# Patient Record
Sex: Female | Born: 1945 | Race: White | Hispanic: No | Marital: Married | Smoking: Never smoker
Health system: Southern US, Community
[De-identification: ages and names within clinical notes are randomized; demographics above are authoritative.]

## PROBLEM LIST (undated history)

## (undated) DIAGNOSIS — N952 Postmenopausal atrophic vaginitis: Secondary | ICD-10-CM

## (undated) DIAGNOSIS — N390 Urinary tract infection, site not specified: Secondary | ICD-10-CM

## (undated) DIAGNOSIS — T7840XA Allergy, unspecified, initial encounter: Secondary | ICD-10-CM

## (undated) DIAGNOSIS — M797 Fibromyalgia: Secondary | ICD-10-CM

## (undated) DIAGNOSIS — R011 Cardiac murmur, unspecified: Secondary | ICD-10-CM

## (undated) DIAGNOSIS — G43909 Migraine, unspecified, not intractable, without status migrainosus: Secondary | ICD-10-CM

## (undated) DIAGNOSIS — I341 Nonrheumatic mitral (valve) prolapse: Secondary | ICD-10-CM

## (undated) DIAGNOSIS — M8580 Other specified disorders of bone density and structure, unspecified site: Secondary | ICD-10-CM

## (undated) DIAGNOSIS — R911 Solitary pulmonary nodule: Secondary | ICD-10-CM

## (undated) DIAGNOSIS — K589 Irritable bowel syndrome without diarrhea: Secondary | ICD-10-CM

## (undated) DIAGNOSIS — I10 Essential (primary) hypertension: Secondary | ICD-10-CM

## (undated) DIAGNOSIS — K5730 Diverticulosis of large intestine without perforation or abscess without bleeding: Secondary | ICD-10-CM

## (undated) DIAGNOSIS — K219 Gastro-esophageal reflux disease without esophagitis: Secondary | ICD-10-CM

## (undated) DIAGNOSIS — K649 Unspecified hemorrhoids: Secondary | ICD-10-CM

## (undated) DIAGNOSIS — E785 Hyperlipidemia, unspecified: Secondary | ICD-10-CM

## (undated) HISTORY — DX: Postmenopausal atrophic vaginitis: N95.2

## (undated) HISTORY — DX: Nonrheumatic mitral (valve) prolapse: I34.1

## (undated) HISTORY — DX: Urinary tract infection, site not specified: N39.0

## (undated) HISTORY — DX: Fibromyalgia: M79.7

## (undated) HISTORY — DX: Solitary pulmonary nodule: R91.1

## (undated) HISTORY — DX: Other specified disorders of bone density and structure, unspecified site: M85.80

## (undated) HISTORY — DX: Hyperlipidemia, unspecified: E78.5

## (undated) HISTORY — DX: Diverticulosis of large intestine without perforation or abscess without bleeding: K57.30

## (undated) HISTORY — DX: Cardiac murmur, unspecified: R01.1

## (undated) HISTORY — DX: Irritable bowel syndrome, unspecified: K58.9

## (undated) HISTORY — DX: Gastro-esophageal reflux disease without esophagitis: K21.9

## (undated) HISTORY — DX: Unspecified hemorrhoids: K64.9

## (undated) HISTORY — DX: Allergy, unspecified, initial encounter: T78.40XA

## (undated) HISTORY — DX: Migraine, unspecified, not intractable, without status migrainosus: G43.909

---

## 1993-09-24 HISTORY — PX: BURCH PROCEDURE: SHX1273

## 1993-09-24 HISTORY — PX: BLADDER SURGERY: SHX569

## 1999-07-12 ENCOUNTER — Other Ambulatory Visit: Admission: RE | Admit: 1999-07-12 | Discharge: 1999-07-12 | Payer: Self-pay | Admitting: Obstetrics and Gynecology

## 2000-07-31 ENCOUNTER — Other Ambulatory Visit: Admission: RE | Admit: 2000-07-31 | Discharge: 2000-07-31 | Payer: Self-pay | Admitting: Obstetrics and Gynecology

## 2001-06-12 ENCOUNTER — Emergency Department (HOSPITAL_COMMUNITY): Admission: EM | Admit: 2001-06-12 | Discharge: 2001-06-12 | Payer: Self-pay | Admitting: Emergency Medicine

## 2001-09-24 HISTORY — PX: BREAST BIOPSY: SHX20

## 2001-11-03 ENCOUNTER — Other Ambulatory Visit: Admission: RE | Admit: 2001-11-03 | Discharge: 2001-11-03 | Payer: Self-pay | Admitting: Obstetrics and Gynecology

## 2003-01-13 ENCOUNTER — Other Ambulatory Visit: Admission: RE | Admit: 2003-01-13 | Discharge: 2003-01-13 | Payer: Self-pay | Admitting: Obstetrics and Gynecology

## 2003-09-25 DIAGNOSIS — N390 Urinary tract infection, site not specified: Secondary | ICD-10-CM

## 2003-09-25 HISTORY — DX: Urinary tract infection, site not specified: N39.0

## 2004-03-06 ENCOUNTER — Encounter: Payer: Self-pay | Admitting: Internal Medicine

## 2004-05-25 ENCOUNTER — Other Ambulatory Visit: Admission: RE | Admit: 2004-05-25 | Discharge: 2004-05-25 | Payer: Self-pay | Admitting: Obstetrics and Gynecology

## 2005-03-22 ENCOUNTER — Ambulatory Visit (HOSPITAL_COMMUNITY): Admission: RE | Admit: 2005-03-22 | Discharge: 2005-03-22 | Payer: Self-pay | Admitting: Obstetrics and Gynecology

## 2005-05-16 ENCOUNTER — Ambulatory Visit: Payer: Self-pay | Admitting: Internal Medicine

## 2005-05-24 ENCOUNTER — Encounter: Admission: RE | Admit: 2005-05-24 | Discharge: 2005-08-22 | Payer: Self-pay | Admitting: Internal Medicine

## 2005-07-05 ENCOUNTER — Ambulatory Visit: Payer: Self-pay | Admitting: Family Medicine

## 2005-08-01 ENCOUNTER — Other Ambulatory Visit: Admission: RE | Admit: 2005-08-01 | Discharge: 2005-08-01 | Payer: Self-pay | Admitting: Obstetrics and Gynecology

## 2006-01-10 ENCOUNTER — Ambulatory Visit: Payer: Self-pay | Admitting: Internal Medicine

## 2006-01-29 ENCOUNTER — Ambulatory Visit: Payer: Self-pay | Admitting: Internal Medicine

## 2006-07-25 DIAGNOSIS — R911 Solitary pulmonary nodule: Secondary | ICD-10-CM

## 2006-07-25 HISTORY — DX: Solitary pulmonary nodule: R91.1

## 2006-08-02 ENCOUNTER — Inpatient Hospital Stay (HOSPITAL_COMMUNITY): Admission: EM | Admit: 2006-08-02 | Discharge: 2006-08-03 | Payer: Self-pay | Admitting: Emergency Medicine

## 2006-08-02 ENCOUNTER — Ambulatory Visit: Payer: Self-pay | Admitting: Internal Medicine

## 2006-08-07 ENCOUNTER — Other Ambulatory Visit: Admission: RE | Admit: 2006-08-07 | Discharge: 2006-08-07 | Payer: Self-pay | Admitting: Obstetrics and Gynecology

## 2006-08-08 ENCOUNTER — Ambulatory Visit: Payer: Self-pay | Admitting: Internal Medicine

## 2006-10-31 ENCOUNTER — Ambulatory Visit: Payer: Self-pay | Admitting: Internal Medicine

## 2006-11-26 ENCOUNTER — Ambulatory Visit: Payer: Self-pay | Admitting: Internal Medicine

## 2006-11-26 LAB — CONVERTED CEMR LAB
ALT: 19 units/L (ref 0–40)
AST: 22 units/L (ref 0–37)
Albumin: 4.2 g/dL (ref 3.5–5.2)
Alkaline Phosphatase: 38 units/L — ABNORMAL LOW (ref 39–117)
BUN: 11 mg/dL (ref 6–23)
Basophils Absolute: 0 10*3/uL (ref 0.0–0.1)
Basophils Relative: 0.6 % (ref 0.0–1.0)
Bilirubin, Direct: 0.1 mg/dL (ref 0.0–0.3)
CO2: 28 meq/L (ref 19–32)
Calcium: 9.5 mg/dL (ref 8.4–10.5)
Chloride: 103 meq/L (ref 96–112)
Cholesterol: 255 mg/dL (ref 0–200)
Creatinine, Ser: 0.8 mg/dL (ref 0.4–1.2)
Direct LDL: 151.9 mg/dL
Eosinophils Absolute: 0 10*3/uL (ref 0.0–0.6)
Eosinophils Relative: 1 % (ref 0.0–5.0)
GFR calc Af Amer: 94 mL/min
GFR calc non Af Amer: 78 mL/min
Glucose, Bld: 86 mg/dL (ref 70–99)
HCT: 41.2 % (ref 36.0–46.0)
HDL: 81.7 mg/dL (ref >39.0)
Hemoglobin: 14 g/dL (ref 12.0–15.0)
Hgb A1c MFr Bld: 5.4 % (ref 4.6–6.0)
Lymphocytes Relative: 22.6 % (ref 12.0–46.0)
MCHC: 33.9 g/dL (ref 30.0–36.0)
MCV: 90.4 fL (ref 78.0–100.0)
Monocytes Absolute: 0.3 10*3/uL (ref 0.2–0.7)
Monocytes Relative: 6.2 % (ref 3.0–11.0)
Neutro Abs: 3.4 10*3/uL (ref 1.4–7.7)
Neutrophils Relative %: 69.6 % (ref 43.0–77.0)
Platelets: 261 10*3/uL (ref 150–400)
Potassium: 4.1 meq/L (ref 3.5–5.1)
RBC: 4.56 M/uL (ref 3.87–5.11)
RDW: 12.1 % (ref 11.5–14.6)
Sodium: 138 meq/L (ref 135–145)
TSH: 2.27 microintl units/mL (ref 0.35–5.50)
Total Bilirubin: 1 mg/dL (ref 0.3–1.2)
Total CHOL/HDL Ratio: 3.1
Total Protein: 6.7 g/dL (ref 6.0–8.3)
Triglycerides: 55 mg/dL (ref 0–149)
VLDL: 11 mg/dL (ref 0–40)
WBC: 4.8 10*3/uL (ref 4.5–10.5)

## 2007-06-13 ENCOUNTER — Encounter: Payer: Self-pay | Admitting: Internal Medicine

## 2007-07-09 ENCOUNTER — Ambulatory Visit: Payer: Self-pay | Admitting: Internal Medicine

## 2007-08-11 ENCOUNTER — Other Ambulatory Visit: Admission: RE | Admit: 2007-08-11 | Discharge: 2007-08-11 | Payer: Self-pay | Admitting: Obstetrics and Gynecology

## 2008-05-27 ENCOUNTER — Ambulatory Visit: Payer: Self-pay | Admitting: Internal Medicine

## 2008-05-27 DIAGNOSIS — K589 Irritable bowel syndrome without diarrhea: Secondary | ICD-10-CM

## 2008-05-27 DIAGNOSIS — M8580 Other specified disorders of bone density and structure, unspecified site: Secondary | ICD-10-CM

## 2008-05-27 DIAGNOSIS — IMO0001 Reserved for inherently not codable concepts without codable children: Secondary | ICD-10-CM

## 2008-05-27 DIAGNOSIS — R5382 Chronic fatigue, unspecified: Secondary | ICD-10-CM

## 2008-05-31 DIAGNOSIS — E785 Hyperlipidemia, unspecified: Secondary | ICD-10-CM | POA: Insufficient documentation

## 2008-06-01 ENCOUNTER — Encounter (INDEPENDENT_AMBULATORY_CARE_PROVIDER_SITE_OTHER): Payer: Self-pay | Admitting: *Deleted

## 2008-06-01 ENCOUNTER — Telehealth (INDEPENDENT_AMBULATORY_CARE_PROVIDER_SITE_OTHER): Payer: Self-pay | Admitting: *Deleted

## 2008-06-03 ENCOUNTER — Encounter: Payer: Self-pay | Admitting: Internal Medicine

## 2008-06-04 ENCOUNTER — Encounter (INDEPENDENT_AMBULATORY_CARE_PROVIDER_SITE_OTHER): Payer: Self-pay | Admitting: *Deleted

## 2008-06-23 ENCOUNTER — Telehealth (INDEPENDENT_AMBULATORY_CARE_PROVIDER_SITE_OTHER): Payer: Self-pay | Admitting: *Deleted

## 2008-08-10 ENCOUNTER — Telehealth (INDEPENDENT_AMBULATORY_CARE_PROVIDER_SITE_OTHER): Payer: Self-pay | Admitting: *Deleted

## 2008-08-18 ENCOUNTER — Other Ambulatory Visit: Admission: RE | Admit: 2008-08-18 | Discharge: 2008-08-18 | Payer: Self-pay | Admitting: Obstetrics and Gynecology

## 2008-08-18 ENCOUNTER — Encounter: Payer: Self-pay | Admitting: Obstetrics and Gynecology

## 2008-08-18 ENCOUNTER — Ambulatory Visit: Payer: Self-pay | Admitting: Obstetrics and Gynecology

## 2008-09-29 ENCOUNTER — Telehealth (INDEPENDENT_AMBULATORY_CARE_PROVIDER_SITE_OTHER): Payer: Self-pay | Admitting: *Deleted

## 2008-10-06 ENCOUNTER — Other Ambulatory Visit: Payer: Self-pay | Admitting: Emergency Medicine

## 2008-10-06 ENCOUNTER — Observation Stay (HOSPITAL_COMMUNITY): Admission: EM | Admit: 2008-10-06 | Discharge: 2008-10-07 | Payer: Self-pay | Admitting: Emergency Medicine

## 2008-10-06 ENCOUNTER — Ambulatory Visit: Payer: Self-pay | Admitting: Internal Medicine

## 2008-10-06 ENCOUNTER — Other Ambulatory Visit: Payer: Self-pay | Admitting: Internal Medicine

## 2008-10-06 ENCOUNTER — Ambulatory Visit: Payer: Self-pay | Admitting: Cardiology

## 2008-10-07 ENCOUNTER — Encounter: Payer: Self-pay | Admitting: Internal Medicine

## 2008-10-14 ENCOUNTER — Ambulatory Visit: Payer: Self-pay | Admitting: Internal Medicine

## 2008-10-26 ENCOUNTER — Encounter: Payer: Self-pay | Admitting: Internal Medicine

## 2008-11-19 ENCOUNTER — Encounter: Admission: RE | Admit: 2008-11-19 | Discharge: 2009-02-17 | Payer: Self-pay | Admitting: Otolaryngology

## 2008-12-07 ENCOUNTER — Encounter: Payer: Self-pay | Admitting: Internal Medicine

## 2008-12-20 ENCOUNTER — Encounter: Admission: RE | Admit: 2008-12-20 | Discharge: 2009-01-27 | Payer: Self-pay | Admitting: Rheumatology

## 2009-01-11 ENCOUNTER — Encounter: Payer: Self-pay | Admitting: Internal Medicine

## 2009-02-25 ENCOUNTER — Encounter: Payer: Self-pay | Admitting: Internal Medicine

## 2009-06-03 ENCOUNTER — Encounter: Payer: Self-pay | Admitting: Internal Medicine

## 2009-06-06 ENCOUNTER — Telehealth (INDEPENDENT_AMBULATORY_CARE_PROVIDER_SITE_OTHER): Payer: Self-pay | Admitting: *Deleted

## 2009-06-09 ENCOUNTER — Ambulatory Visit: Payer: Self-pay | Admitting: Internal Medicine

## 2009-06-09 DIAGNOSIS — H6120 Impacted cerumen, unspecified ear: Secondary | ICD-10-CM

## 2009-06-09 DIAGNOSIS — K5730 Diverticulosis of large intestine without perforation or abscess without bleeding: Secondary | ICD-10-CM | POA: Insufficient documentation

## 2009-06-10 ENCOUNTER — Encounter (INDEPENDENT_AMBULATORY_CARE_PROVIDER_SITE_OTHER): Payer: Self-pay | Admitting: *Deleted

## 2009-06-13 ENCOUNTER — Encounter (INDEPENDENT_AMBULATORY_CARE_PROVIDER_SITE_OTHER): Payer: Self-pay | Admitting: *Deleted

## 2009-06-15 ENCOUNTER — Telehealth (INDEPENDENT_AMBULATORY_CARE_PROVIDER_SITE_OTHER): Payer: Self-pay | Admitting: *Deleted

## 2009-06-20 ENCOUNTER — Encounter: Payer: Self-pay | Admitting: Internal Medicine

## 2009-07-04 ENCOUNTER — Ambulatory Visit: Payer: Self-pay | Admitting: Internal Medicine

## 2009-07-14 ENCOUNTER — Ambulatory Visit: Payer: Self-pay | Admitting: Internal Medicine

## 2009-09-01 ENCOUNTER — Encounter: Payer: Self-pay | Admitting: Internal Medicine

## 2009-12-17 IMAGING — CR DG CHEST 1V PORT
1 series · 1 of 1 positions shown · non-contrast
Comparison: 08/02/2006

CLINICAL DATA: Chest pain

PORTABLE CHEST - 1 VIEW

[AP]
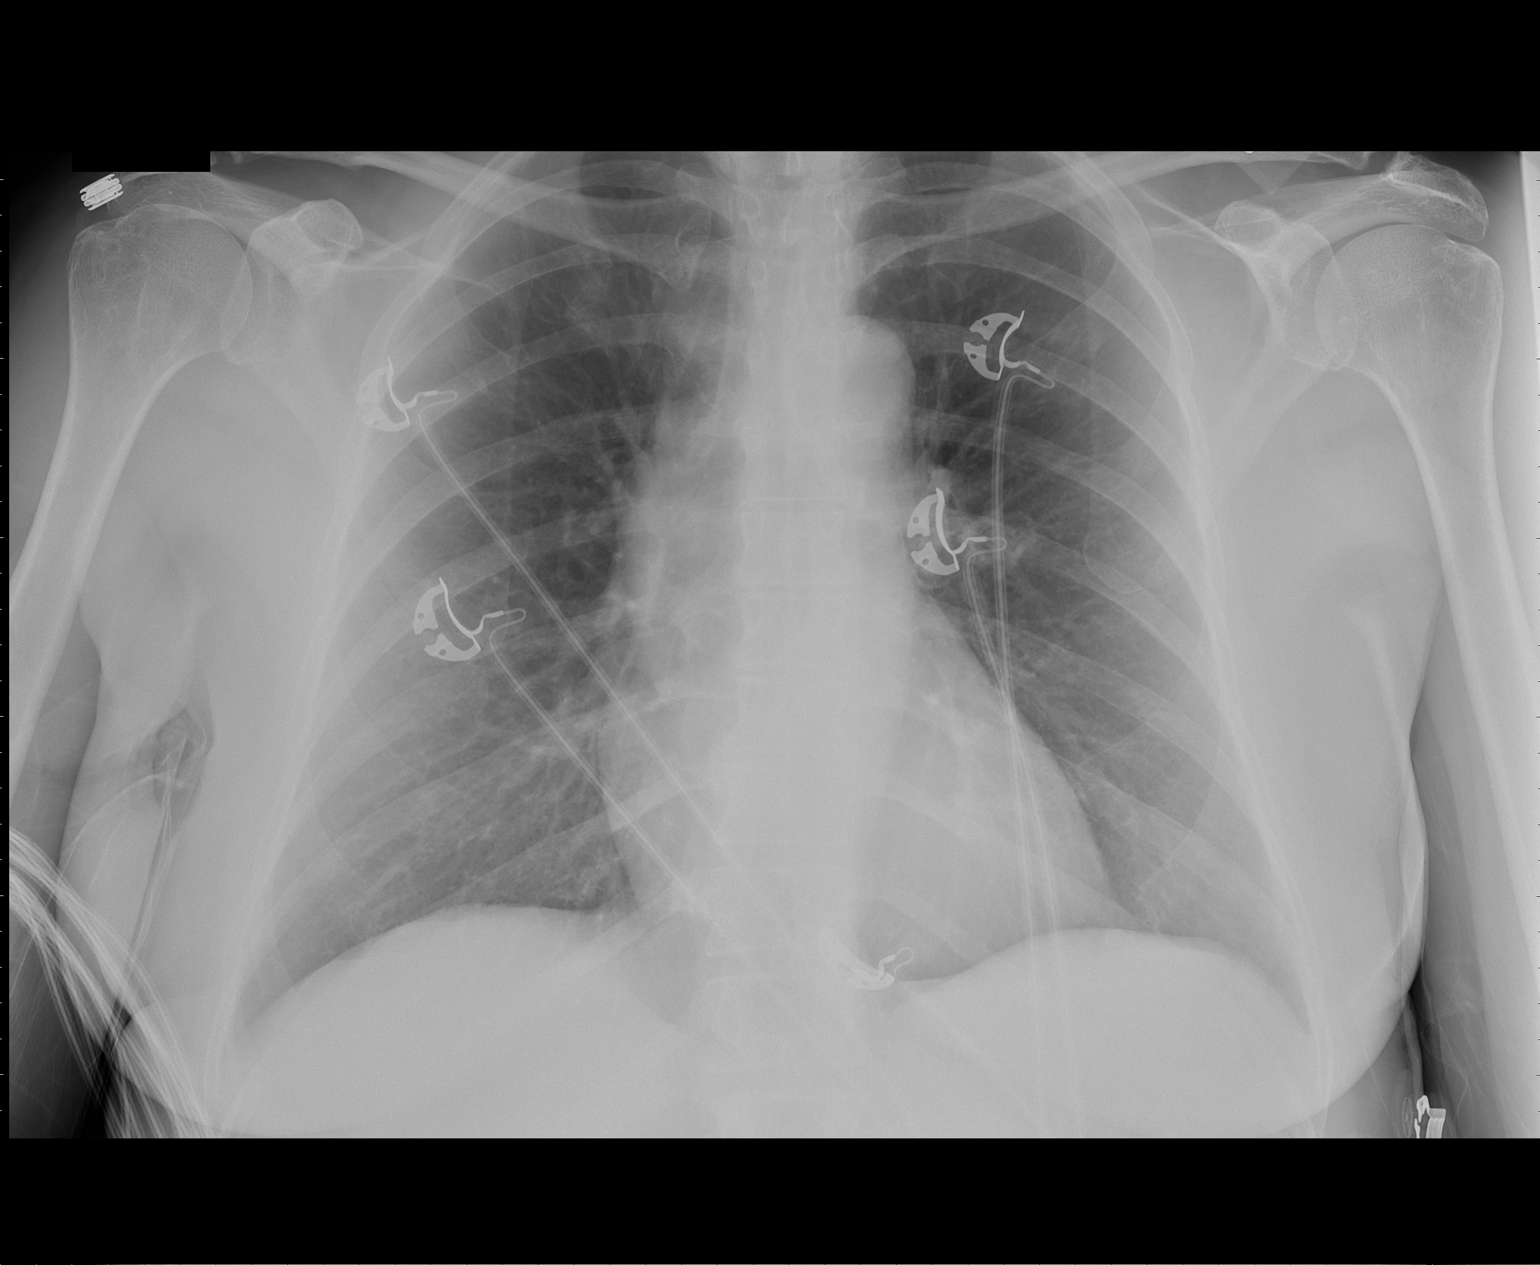

[1 of 1 positions shown; findings below may reference images not displayed]

FINDINGS: Heart and mediastinal contours normal.  Possible right
upper lobe nodule but unchanged.  If this is a lung lesion that is
stable for a period of greater than 2 years, and needs no further
evaluation.  It may actually the a calcified costochondral
cartilage.  At any rate, it is of no clinical significance.
IMPRESSION: No active disease - stable right upper lobe nodule versus calcified
costochondral cartilage.

## 2009-12-22 ENCOUNTER — Other Ambulatory Visit: Admission: RE | Admit: 2009-12-22 | Discharge: 2009-12-22 | Payer: Self-pay | Admitting: Obstetrics and Gynecology

## 2009-12-22 ENCOUNTER — Ambulatory Visit: Payer: Self-pay | Admitting: Obstetrics and Gynecology

## 2009-12-27 ENCOUNTER — Ambulatory Visit: Payer: Self-pay | Admitting: Internal Medicine

## 2010-01-06 ENCOUNTER — Encounter: Payer: Self-pay | Admitting: Internal Medicine

## 2010-01-17 ENCOUNTER — Telehealth (INDEPENDENT_AMBULATORY_CARE_PROVIDER_SITE_OTHER): Payer: Self-pay | Admitting: *Deleted

## 2010-01-19 ENCOUNTER — Ambulatory Visit: Payer: Self-pay | Admitting: Internal Medicine

## 2010-01-23 LAB — CONVERTED CEMR LAB: Vit D, 25-Hydroxy: 42 ng/mL (ref 30–89)

## 2010-05-04 ENCOUNTER — Encounter: Payer: Self-pay | Admitting: Internal Medicine

## 2010-06-21 ENCOUNTER — Ambulatory Visit: Payer: Self-pay | Admitting: Internal Medicine

## 2010-10-20 ENCOUNTER — Encounter: Payer: Self-pay | Admitting: Internal Medicine

## 2010-10-22 LAB — CONVERTED CEMR LAB
ALT: 23 units/L (ref 0–35)
AST: 23 units/L (ref 0–37)
Albumin: 4.3 g/dL (ref 3.5–5.2)
Alkaline Phosphatase: 31 units/L — ABNORMAL LOW (ref 39–117)
Alkaline Phosphatase: 37 units/L — ABNORMAL LOW (ref 39–117)
BUN: 13 mg/dL (ref 6–23)
Basophils Absolute: 0 10*3/uL (ref 0.0–0.1)
Basophils Absolute: 0 10*3/uL (ref 0.0–0.1)
Bilirubin Urine: NEGATIVE
Bilirubin Urine: NEGATIVE
Bilirubin, Direct: 0.1 mg/dL (ref 0.0–0.3)
Bilirubin, Direct: 0.1 mg/dL (ref 0.0–0.3)
Blood in Urine, dipstick: NEGATIVE
CO2: 27 meq/L (ref 19–32)
CO2: 29 meq/L (ref 19–32)
Calcium: 9.2 mg/dL (ref 8.4–10.5)
Cholesterol: 236 mg/dL — ABNORMAL HIGH (ref 0–200)
Creatinine, Ser: 0.8 mg/dL (ref 0.4–1.2)
Eosinophils Absolute: 0 10*3/uL (ref 0.0–0.7)
GFR calc Af Amer: 94 mL/min
GFR calc non Af Amer: 77.08 mL/min (ref >60)
Glucose, Bld: 88 mg/dL (ref 70–99)
Glucose, Bld: 89 mg/dL (ref 70–99)
HCT: 38.9 % (ref 36.0–46.0)
HDL: 76.5 mg/dL (ref >39.00)
Hemoglobin: 13.9 g/dL (ref 12.0–15.0)
Hemoglobin: 14 g/dL (ref 12.0–15.0)
Ketones, urine, test strip: NEGATIVE
Ketones, urine, test strip: NEGATIVE
Lymphocytes Relative: 23.5 % (ref 12.0–46.0)
MCHC: 34.1 g/dL (ref 30.0–36.0)
MCHC: 35.8 g/dL (ref 30.0–36.0)
MCV: 90.6 fL (ref 78.0–100.0)
Monocytes Relative: 6.2 % (ref 3.0–12.0)
Monocytes Relative: 7.3 % (ref 3.0–12.0)
Neutrophils Relative %: 69.2 % (ref 43.0–77.0)
Nitrite: NEGATIVE
Nitrite: NEGATIVE
Platelets: 231 10*3/uL (ref 150.0–400.0)
Potassium: 3.9 meq/L (ref 3.5–5.1)
Protein, U semiquant: NEGATIVE
RBC: 4.29 M/uL (ref 3.87–5.11)
RDW: 11.7 % (ref 11.5–14.6)
Sodium: 134 meq/L — ABNORMAL LOW (ref 135–145)
Sodium: 139 meq/L (ref 135–145)
Specific Gravity, Urine: <1.005
Total Bilirubin: 0.9 mg/dL (ref 0.3–1.2)
Total Bilirubin: 1 mg/dL (ref 0.3–1.2)
Total Protein: 6.9 g/dL (ref 6.0–8.3)
Triglycerides: 37 mg/dL (ref 0.0–149.0)
Urobilinogen, UA: NEGATIVE
Vit D, 1,25-Dihydroxy: 31 (ref 30–89)
Vit D, 25-Hydroxy: 37 ng/mL (ref 30–89)
WBC: 4.6 10*3/uL (ref 4.5–10.5)

## 2010-10-24 NOTE — Assessment & Plan Note (Signed)
Summary: FLU SHOT/CBS  Nurse Visit   Allergies: 1)  ! Levaquin 2)  ! Clindamycin 3)  ! Lipitor 4)  ! Actonel (Risedronate Sodium) 5)  ! Prednisone  Orders Added: 1)  Admin 1st Vaccine [90471] 2)  Flu Vaccine 18yrs + [16109] Flu Vaccine Consent Questions     Do you have a history of severe allergic reactions to this vaccine? no    Any prior history of allergic reactions to egg and/or gelatin? no    Do you have a sensitivity to the preservative Thimersol? no    Do you have a past history of Guillan-Barre Syndrome? no    Do you currently have an acute febrile illness? no    Have you ever had a severe reaction to latex? no    Vaccine information given and explained to patient? yes    Are you currently pregnant? no    Lot Number:[id number]   Exp Date:03/24/2011   Site Given  Left Deltoid IM

## 2010-10-24 NOTE — Letter (Signed)
Summary: [facility]  [facility]   Imported By: Lanelle Bal 01/18/2010 14:23:32  _____________________________________________________________________  External Attachment:    Type:   Image     Comment:   External Document

## 2010-10-24 NOTE — Progress Notes (Signed)
Summary: Lab Concerns  Phone Note Call from Patient Call back at Home Phone [phone]   Caller: Patient Summary of Call: Message left on VM: patient had labs done a few weeks back and would like results. Please call   I looked in the lab and patient was here on 12/27/09 and had Vit D but no report. Rene Kocher in the labs will do a futher follow-up  I contacted the patient and left message informing her I will call her when I get an update from [facility]  January 17, 2010 2:07 PM      Follow-up for Phone Call        Please document what happened to the patient's blood.  Follow-up by: Shonna Chock,  January 18, 2010 8:22 AM  Additional Follow-up for Phone Call Additional follow up Details #1::        I spoke with pt and she has a lab appt tomorrow a.m. for Vitamin D Level- Pt is aware she will only be charged once for the lab. Army Fossa CMA  January 18, 2010 1:29 PM

## 2010-10-24 NOTE — Letter (Signed)
Summary: [facility]  [facility]   Imported By: Lanelle Bal 05/12/2010 14:19:39  _____________________________________________________________________  External Attachment:    Type:   Image     Comment:   External Document

## 2010-11-09 NOTE — Letter (Signed)
Summary: [facility]  [facility]   Imported By: Maryln Gottron 11/03/2010 10:19:10  _____________________________________________________________________  External Attachment:    Type:   Image     Comment:   External Document

## 2011-01-08 LAB — CARDIAC PANEL(CRET KIN+CKTOT+MB+TROPI)
Total CK: 101 U/L (ref 7–177)
Troponin I: 0.01 ng/mL (ref 0.00–0.06)
Troponin I: <0.01 ng/mL (ref 0.00–0.06)

## 2011-01-08 LAB — CBC
HCT: 37.2 % (ref 36.0–46.0)
Hemoglobin: 12.8 g/dL (ref 12.0–15.0)
Platelets: 218 10*3/uL (ref 150–400)
RDW: 13 % (ref 11.5–15.5)
WBC: 5.4 10*3/uL (ref 4.0–10.5)

## 2011-01-08 LAB — POCT CARDIAC MARKERS: Myoglobin, poc: 53.7 ng/mL (ref 12–200)

## 2011-01-08 LAB — LIPID PANEL
HDL: 79 mg/dL (ref >39)
Total CHOL/HDL Ratio: 3.1 RATIO

## 2011-01-08 LAB — DIFFERENTIAL
Basophils Absolute: 0 10*3/uL (ref 0.0–0.1)
Basophils Relative: 1 % (ref 0–1)
Eosinophils Absolute: 0.1 10*3/uL (ref 0.0–0.7)
Lymphocytes Relative: 25 % (ref 12–46)
Lymphs Abs: 1.4 10*3/uL (ref 0.7–4.0)
Monocytes Relative: 7 % (ref 3–12)
Neutro Abs: 3.6 10*3/uL (ref 1.7–7.7)
Neutrophils Relative %: 67 % (ref 43–77)

## 2011-01-08 LAB — BASIC METABOLIC PANEL
CO2: 25 mEq/L (ref 19–32)
Calcium: 9 mg/dL (ref 8.4–10.5)
Creatinine, Ser: 0.78 mg/dL (ref 0.4–1.2)
GFR calc Af Amer: >60 mL/min (ref >60)
GFR calc non Af Amer: >60 mL/min (ref >60)
Glucose, Bld: 90 mg/dL (ref 70–99)

## 2011-01-08 LAB — CK TOTAL AND CKMB (NOT AT ARMC)
CK, MB: 3.9 ng/mL (ref 0.3–4.0)
Total CK: 183 U/L — ABNORMAL HIGH (ref 7–177)

## 2011-01-08 LAB — TROPONIN I: Troponin I: 0.01 ng/mL (ref 0.00–0.06)

## 2011-01-08 LAB — D-DIMER, QUANTITATIVE: D-Dimer, Quant: 0.41 ug/mL-FEU (ref 0.00–0.48)

## 2011-01-08 LAB — BRAIN NATRIURETIC PEPTIDE: Pro B Natriuretic peptide (BNP): <30 pg/mL (ref 0.0–100.0)

## 2011-01-11 ENCOUNTER — Encounter (INDEPENDENT_AMBULATORY_CARE_PROVIDER_SITE_OTHER): Payer: PRIVATE HEALTH INSURANCE | Admitting: Obstetrics and Gynecology

## 2011-01-11 ENCOUNTER — Other Ambulatory Visit: Payer: Self-pay | Admitting: Obstetrics and Gynecology

## 2011-01-11 ENCOUNTER — Other Ambulatory Visit (HOSPITAL_COMMUNITY)
Admission: RE | Admit: 2011-01-11 | Discharge: 2011-01-11 | Disposition: A | Discharge status: Home / Self Care | Payer: PRIVATE HEALTH INSURANCE | Source: Ambulatory Visit | Attending: Obstetrics and Gynecology | Admitting: Obstetrics and Gynecology

## 2011-01-11 DIAGNOSIS — R82998 Other abnormal findings in urine: Secondary | ICD-10-CM

## 2011-01-11 DIAGNOSIS — Z01419 Encounter for gynecological examination (general) (routine) without abnormal findings: Secondary | ICD-10-CM

## 2011-01-11 DIAGNOSIS — Z124 Encounter for screening for malignant neoplasm of cervix: Secondary | ICD-10-CM | POA: Insufficient documentation

## 2011-01-16 DIAGNOSIS — Z1211 Encounter for screening for malignant neoplasm of colon: Secondary | ICD-10-CM

## 2011-01-26 ENCOUNTER — Other Ambulatory Visit: Payer: PRIVATE HEALTH INSURANCE

## 2011-02-06 NOTE — H&P (Signed)
Heather Ramsey, Heather Ramsey           ACCOUNT NO.:  [id number]   MEDICAL RECORD NO.:  [id number]          PATIENT TYPE:  EMS   LOCATION:  MINO                         FACILITY:  [facility]   PHYSICIAN:  Valerie A. Felicity Coyer, MDDATE OF BIRTH:  [birth year]   DATE OF ADMISSION:  10/06/2008  DATE OF DISCHARGE:                              HISTORY & PHYSICAL   PRIMARY CARE PHYSICIAN:  Titus Dubin. Hopper, MD,FACP,FCCP   CHIEF COMPLAINT:  Chest pain and jaw pain; dizziness with multiple falls  over last 2 months.   HISTORY OF PRESENT ILLNESS:  The patient is a 65 year old white female  recently healthy, but with fibromyalgia.  No known hypertension or  diabetes, who presents to the [facility] Emergency Room this morning  with multiple complaints.  First and most recent is the onset of chest  pain and jaw pain symptoms this morning.  She describes extreme pain  beneath her left breast 10/10 with simultaneous right-sided jaw pain.  Symptoms were sudden onset.  One previous episode like today occurring  in July of last year, but did not persist at that time like it did  today.  This episode lasted 9 minutes.  A persisting dull sensation  remained with her after these intense 9 minutes of pain, but at this  time of my exam, she has 0/10 pain symptoms without any intervention.  She was concerned for heart attack due to family history of same having  3 sisters with heart attacks ranging one in her 56s, 2 in their 33s,  also her father with heart attack, his first being suffered in his 30s.  She has other complaints including buckling of her knees with weakness  10 days ago while shopping with her husband, she also had an episode of  weakness and fall associated with dizziness that occurred the week  before Thanksgiving in November 2009.  The dizziness with this  Thanksgiving episode occurred while she was in the attic where her head  spun and caused her to fall.  She fell to the ceiling suffering pain  in  her hip.  She had it evaluated at that time by urgent care and says  nothing was broken and has a hematoma that has since resolved.  These  dizzy spells have been on and off since October and she has identified  no pattern to the symptom presence such as time of day, intensity,  relation to food.  No headache.  No vision symptoms or weakness  associated with her dizziness symptoms, besides she is unable to  exercise due to the unpredictable nature of these symptom onset.  She  has an outpatient appointment with ENT at the end of this month due to  these dizziness symptoms.   PAST MEDICAL HISTORY:  Significant for:  1. IBS.  2. Fibromyalgia.  3. Question hyperlipidemia per the EMR.  4. Osteoporosis.   MEDICATIONS:  According to patient,  1. Lidoderm patches 3 daily, one to her hip, one to her back, half to      her right knee and half to her right foot as needed for a daily  basis.  2. Arthrotec 75 mg tablet p.o. b.i.d. p.r.n., none taken in the last      several weeks.  3. Fosamax 70 mg once every Monday.   ALLERGIES:  1. CLINDAMYCIN.  2. LEVAQUIN.   FAMILY HISTORY:  Mother died at age 44 due to a stroke, had a history of  diabetes.  Father died at age 48 with an MI complicating colon cancer  and obstruction, his first MI was in his 30s as stated above.  The  patient with three sisters all of who have had heart attacks; one at age  55, the other two in their 34s, these sisters also had diabetes.   SOCIAL HISTORY:  No tobacco.  She lives with her spouse and dog.   REVIEW OF SYSTEMS:  RHEUMATOLOGIC:  Chronic left lower extremity ache in  her hip, ankle, and knee as well as her right elbow and shoulder  followed by a rheumatologist.  GENERAL:  No travel.  No fever.  No  chills.  CARDIOVASCULAR:  See chest pain in HPI above.  No palpitations.  No lower extremity swelling.  HEME:  No history of clots.  GI:  No known  reflex, positive irritable bowel symptoms, but no  nausea, vomiting, or  diarrhea in recent weeks and no history of diabetes presently.  No hot,  cold, intolerance or skin changes.  RESPIRATORY:  No shortness of breath  or cough.  ENT: Normal hearing without change.  No ear pain or sore  throat.  NEUROLOGIC:  No headache.  No focal weakness.  Positive falls,  see HPI above.  SKIN:  Positive for bruising with falls, but no rashes  or ulcers.  EYES:  No vision change.  No eye pain.   PHYSICAL EXAMINATION:  VITAL SIGNS:  Temperature 98.1, blood pressure  152/81, pulse of 64, respirations 16, sating 99% on room air.  GENERAL:  She is a slightly heavy-set, pleasant white female, in no  acute distress.  Spouse is present at bedside.  NECK:  Thick but supple.  No masses.  No thyromegaly.  No JVD.  EYES:  PERRL.  EOMI.  No nystagmus at rest, no icterus.  No injection.  ENT:  Normal hearing without effusion or clotting of the tympanic  membranes.  Oropharynx is clear.  RESPIRATORY:  No increased work of breathing.  LUNGS:  Lung sounds clear to auscultation bilaterally without wheeze,  rhonchi, or crackle.  CARDIOVASCULAR:  Regular rate and rhythm.  No murmurs or rubs.  No lower  extremity edema.  Dorsalis pedis pulses 2+ bilaterally.  ABDOMEN:  Protuberant but soft, nontender.  Good bowel sounds.  No  masses.  No hepatosplenomegaly appreciated.  NEUROLOGIC:  Cranial nerves II-XII are symmetrically intact.  She  follows commands.  She moves all extremities spontaneously with good  strength.  Her speech is fluent and articulate.  PSYCHIATRY:  Awake, alert, and oriented  x4 with normal mood, pleasant,  and bright affect.   LABORATORY DATA:  Normal CBC with white count 5.4, hemoglobin 12.8, and  platelets 218.  Basic metabolic also in normal limits.  Creatinine 0.78  and glucose 90.  Her D-dimer is negative at 0.41, point of care enzymes  negative x1.  Portable chest x-ray shows a stable right lung nodule  without change over 2 years, also  present on her November 2007 chest x-  [facility] at [facility].  EKG reviewed by me shows sinus rhythm at 56 beats per  minute.  No other acute  ST or T changes.   ASSESSMENT AND PLAN:  1. Transient chest pain and jaw pain, now resolved.  Doubt cardiac,      but positive family history for coronary artery disease.  We will      observe overnight on telemetry, cycle her cardiac enzymes, treat      with aspirin and plan for adenosine Cardiolite in the morning.      Question discharge October 07, 2008, if there is a negative eval.      Symptoms may relate to question esophageal reflux or spasm and we      will therefore start proton pump inhibitor, may also be pleuritic      and costochondritic type as she has not had her home NSAIDs for      arthritis in weeks, encouraged to continue NSAID use p.r.n.  2. Vertigo with dizziness and falls.  Exam is nonfocal, but history of      recurrence and persistence greater than 2 months.  We will check a      head CT to rule out abnormality there, and MRI and MRA if her head      CT is abnormal.  However, if symptoms are intermittent and not      currently present, I do not feel there is a need for neuro      evaluation or MRI at this time.  We will treat with Antivert p.r.n.      vertigo symptoms and outpatient follow up with ENT as stated in HPI      with no new further workup inpatient, provided symptoms remain at      bay and head CT is normal.  3. Fibromyalgia at baseline.  Continue her home Lidoderm patches as      prior to admission with Arthrotec p.r.n.  4. Question history of dyslipidemia as listed in EMR.  The patient is      unaware and we will check a.m. fasting lipid profile and proceed      accordingly.  5. Chronic benign left lung nodule, no change on chest x-ray over 2      years time.  No further workup needed at this time.   Please see orders for further details including scheduling for adenosine  Myoview.      Valerie A. Felicity Coyer, MD   Electronically Signed     VAL/MEDQ  D:  10/06/2008  T:  10/07/2008  Job:  [id number]

## 2011-02-09 NOTE — Discharge Summary (Signed)
NAMECYNDEE, Heather Ramsey NO.:  [id number]   MEDICAL RECORD NO.:  [id number]          PATIENT TYPE:  INP   LOCATION:  2011                         FACILITY:  [facility]   PHYSICIAN:  Corwin Levins, MD      DATE OF BIRTH:  09-Dec-1945   DATE OF ADMISSION:  08/02/2006  DATE OF DISCHARGE:  08/03/2006                                 DISCHARGE SUMMARY   DISCHARGE DIAGNOSES:  1. Chest pain, unclear etiology.  2. History of fibromyalgia.  3. Chronic fatigue.  4. Incidental mild hyponatremia.  5. History of dyslipidemia.  6. Questionable right upper lobe pulmonary nodule by chest x-ray.  7. History of osteoporosis.   PROCEDURES:  None.   CONSULTATIONS:  None.   HISTORY AND PHYSICAL:  See the dictated date of admission, August 02, 2006.   HOSPITAL COURSE:  Ms. Bulls is a 65 year old white female who presented  to the emergency room with an episode of chest discomfort, which started at  8:30 on that date while exercising at the gym.  As it had some atypical  features, with a history of hyperlipidemia, she was admitted to the hospital  for further evaluation.  She was treated with a full dose heparin and cycled  with cardiac enzymes, and placed on telemetry.  ECG was normal sinus rhythm.  Telemetry negative.  Cardiac enzymes negative x3.  No further chest  discomfort was noted, and no new problems were noted as well.  Also of note  is discharge sodium was 142.  As she was ambulatory, eating well, without  further discomfort, workup negative for acute cardiac syndrome, it was felt  that she had gained maximum benefit from this hospitalization and she is to  be discharged home.   DISPOSITION:  Discharged to home in good condition.  There are no activity  or dietary restrictions, except she should avoid exercising at the gym until  further evaluation, and follow a low-cholesterol diet.  She should follow up  with Dr. Alwyn Ren in one week for consideration of outpatient  stress  Cardiolite.   Also of note, with the mention of the possibility of right upper lobe nodule  with recommendation for follow up chest x-ray in 3 months.  The patient is  advised of this.   DISCHARGE MEDICATIONS:  1. Aspirin 81 mg 1 p.o. daily.  2. Tylenol OTC as directed p.r.n.           ______________________________  Corwin Levins, MD     JWJ/MEDQ  D:  08/03/2006  T:  08/03/2006  Job:  [id number]

## 2011-04-23 ENCOUNTER — Encounter: Payer: Self-pay | Admitting: Internal Medicine

## 2011-04-23 ENCOUNTER — Encounter: Payer: PRIVATE HEALTH INSURANCE | Admitting: Internal Medicine

## 2011-04-24 ENCOUNTER — Encounter: Payer: Self-pay | Admitting: Internal Medicine

## 2011-04-24 ENCOUNTER — Ambulatory Visit (INDEPENDENT_AMBULATORY_CARE_PROVIDER_SITE_OTHER): Payer: PRIVATE HEALTH INSURANCE | Admitting: Internal Medicine

## 2011-04-24 VITALS — BP 118/76 | HR 65 | Temp 98.1°F | Resp 12 | Ht 61.5 in | Wt 141.2 lb

## 2011-04-24 DIAGNOSIS — Z0000 Encounter for general adult medical examination without abnormal findings: Secondary | ICD-10-CM

## 2011-04-24 DIAGNOSIS — C4491 Basal cell carcinoma of skin, unspecified: Secondary | ICD-10-CM | POA: Insufficient documentation

## 2011-04-24 DIAGNOSIS — IMO0001 Reserved for inherently not codable concepts without codable children: Secondary | ICD-10-CM

## 2011-04-24 DIAGNOSIS — E785 Hyperlipidemia, unspecified: Secondary | ICD-10-CM

## 2011-04-24 DIAGNOSIS — M899 Disorder of bone, unspecified: Secondary | ICD-10-CM

## 2011-04-24 DIAGNOSIS — M949 Disorder of cartilage, unspecified: Secondary | ICD-10-CM

## 2011-04-24 DIAGNOSIS — Z85828 Personal history of other malignant neoplasm of skin: Secondary | ICD-10-CM | POA: Insufficient documentation

## 2011-04-24 LAB — HEPATIC FUNCTION PANEL
ALT: 20 U/L (ref 0–35)
AST: 22 U/L (ref 0–37)
Alkaline Phosphatase: 50 U/L (ref 39–117)
Bilirubin, Direct: 0.1 mg/dL (ref 0.0–0.3)
Total Bilirubin: 0.8 mg/dL (ref 0.3–1.2)

## 2011-04-24 LAB — CBC WITH DIFFERENTIAL/PLATELET
Basophils Absolute: 0 10*3/uL (ref 0.0–0.1)
Eosinophils Absolute: 0.1 10*3/uL (ref 0.0–0.7)
Hemoglobin: 14.1 g/dL (ref 12.0–15.0)
Lymphocytes Relative: 25.2 % (ref 12.0–46.0)
MCHC: 33.3 g/dL (ref 30.0–36.0)
MCV: 91.6 fl (ref 78.0–100.0)
Monocytes Absolute: 0.3 10*3/uL (ref 0.1–1.0)
Neutro Abs: 3.3 10*3/uL (ref 1.4–7.7)
RDW: 13 % (ref 11.5–14.6)

## 2011-04-24 LAB — BASIC METABOLIC PANEL
CO2: 27 mEq/L (ref 19–32)
Calcium: 9.5 mg/dL (ref 8.4–10.5)
Chloride: 102 mEq/L (ref 96–112)
Creatinine, Ser: 0.9 mg/dL (ref 0.4–1.2)
Glucose, Bld: 85 mg/dL (ref 70–99)
Sodium: 137 mEq/L (ref 135–145)

## 2011-04-24 LAB — LIPID PANEL: HDL: 83.5 mg/dL (ref >39.00)

## 2011-04-24 LAB — TSH: TSH: 2.44 u[IU]/mL (ref 0.35–5.50)

## 2011-04-24 NOTE — Progress Notes (Signed)
Subjective:    Patient ID: Heather Ramsey, female    DOB: 1946/08/08, 65 y.o.   MRN: [id number]  HPI  Heather Ramsey is here for a physical;acute issues include heel pain (see below) & cough      Review of Systems Patient reports no vision/ hearing  changes, adenopathy,fever, weight change,  persistant / recurrent hoarseness , swallowing issues, chest pain,palpitations,edema,persistant /recurrent cough, hemoptysis, dyspnea( rest/ exertional/paroxysmal nocturnal), gastrointestinal bleeding(melena, rectal bleeding), abdominal pain, significant heartburn,  bowel changes,GU symptoms(dysuria, hematuria,pyuria, incontinence ), Gyn symptoms(abnormal  bleeding , pain),  syncope, focal weakness, memory loss,numbness & tingling, skin/hair /nail changes,abnormal bruising or bleeding, anxiety,or depression. She has had pain in the  R  heel for approximately 3 months; this has hindered her exercise program. Since February she has been having intermittent dry, hacking cough. She denies extrinsic symptoms of itchy eyes, sneezing, or  postnasal drainage. She has not had significant reflux symptoms.     Objective:   Physical Exam Gen.: Healthy and well-nourished in appearance. Alert, appropriate and cooperative throughout exam. Head: Normocephalic without obvious abnormalities Eyes: No corneal or conjunctival inflammation noted. Pupils equal round reactive to light and accommodation. Fundal exam is benign without hemorrhages, exudate, papilledema. Extraocular motion intact. Vision grossly norma with lensesl. Ears: External  ear exam reveals no significant lesions or deformities. Canals clear .TMs normal. Hearing is grossly normal bilaterally. Nose: External nasal exam reveals no deformity or inflammation. Nasal mucosa are pink and moist. No lesions or exudates noted. Septum  normal  Mouth: Oral mucosa and oropharynx reveal no lesions or exudates. Teeth in good repair. Neck: No deformities, masses, or tenderness  noted. Range of motion &. Thyroid  normal. Lungs: Normal respiratory effort; chest expands symmetrically. Lungs are clear to auscultation without rales, wheezes, or increased work of breathing. Heart: Normal rate and rhythm. Normal S1 and S2. No gallop, click, or rub. Grade 1/6 systolic  murmur. Abdomen: Bowel sounds normal; abdomen soft and nontender. No masses, organomegaly or hernias noted. Genitalia: Dr Eda Paschal   .                                                                                   Musculoskeletal/extremities: No deformity or scoliosis noted of  the thoracic or lumbar spine. No clubbing, cyanosis, or  edema. Minimal OA  DIP changes noted. Range of motion  normal . Minor crepitus L knee. Minimal tenderness R heel to percussion.Tone & strength  normal.Joints normal. Nail health  good. Vascular: Carotid, radial artery, dorsalis pedis and  posterior tibial pulses are full and equal. No bruits present. Neurologic: Alert and oriented x3. Deep tendon reflexes symmetrical and normal.          Skin: Intact without suspicious lesions or rashes. Lymph: No cervical, axillary  lymphadenopathy present. Psych: Mood and affect are normal. Normally interactive  Assessment & Plan:  #1 comprehensive physical exam; no acute findings #2 see Problem List with Assessments & Recommendations Plan: see Orders   Note: EKG computer read suggested inferior MI. This is an error. There is no evidence of ischemia and only a small Q wave.

## 2011-04-24 NOTE — Patient Instructions (Signed)
Preventive Health Care: Exercise  30-45  minutes a day, 3-4 days a week. Walking is especially valuable in preventing Osteoporosis. Eat a low-fat diet with lots of fruits and vegetables, up to 7-9 servings per day. Avoid obesity; your goal = waist less than 35 inches.Consume less than 30 grams of sugar per day from foods & drinks with High Fructose Corn Syrup as #2,3 or #4 on label. The triggers for  REFLUX include stress; the "aspirin family" ; alcohol; peppermint; and caffeine (coffee, tea, cola, and chocolate). The aspirin family would include aspirin and the nonsteroidal agents such as ibuprofen &  Naproxen. Tylenol would not cause reflux. Food & drink should be avoided for @ least 2 hours before going to bed. Assess response to Prilosec OTC twice a day

## 2011-05-09 ENCOUNTER — Other Ambulatory Visit: Payer: Self-pay

## 2011-05-09 DIAGNOSIS — M8580 Other specified disorders of bone density and structure, unspecified site: Secondary | ICD-10-CM

## 2011-07-03 ENCOUNTER — Encounter: Payer: Self-pay | Admitting: Obstetrics and Gynecology

## 2011-07-05 ENCOUNTER — Other Ambulatory Visit: Payer: Self-pay | Admitting: Obstetrics and Gynecology

## 2011-07-05 DIAGNOSIS — M8580 Other specified disorders of bone density and structure, unspecified site: Secondary | ICD-10-CM

## 2011-07-13 ENCOUNTER — Encounter: Payer: Self-pay | Admitting: Internal Medicine

## 2011-07-19 ENCOUNTER — Telehealth: Payer: Self-pay

## 2011-07-19 NOTE — Telephone Encounter (Signed)
Call from patient and she wanted to know if her BMD had gotten worst and if so would she need to restart the Fosamax. Copy of Bone Density left on the ledge.... Please advise    KP

## 2011-07-20 NOTE — Telephone Encounter (Signed)
No other intervention is needed at this time. If there is progressive bone loss on future bone densities rather than the stable pattern seen, other therapies would be considered .BMD every 25 mos & vit D level  annually

## 2011-07-20 NOTE — Telephone Encounter (Signed)
Patient informed. 

## 2011-07-20 NOTE — Telephone Encounter (Signed)
The spine density has decreased slightly but is still low normal. Values @ the hips are stable. Fosamax should be taken for 5 years total. Bone Density should be performed every 25 months. Vitamin D level should be monitored annually.

## 2011-07-20 NOTE — Telephone Encounter (Signed)
Patient would like to know if she needs to take any medications. She stated she had taken the fosamax for 5 years in the past and is currently not using anything, she has been off of medications for 2 years and wants to know if she should restart. Please advise    KP

## 2011-11-06 ENCOUNTER — Ambulatory Visit (INDEPENDENT_AMBULATORY_CARE_PROVIDER_SITE_OTHER): Payer: Medicare Other | Admitting: Internal Medicine

## 2011-11-06 VITALS — BP 138/82 | HR 68 | Temp 99.2°F | Ht 61.0 in | Wt 119.0 lb

## 2011-11-06 DIAGNOSIS — J209 Acute bronchitis, unspecified: Secondary | ICD-10-CM

## 2011-11-06 DIAGNOSIS — J069 Acute upper respiratory infection, unspecified: Secondary | ICD-10-CM

## 2011-11-06 MED ORDER — AZITHROMYCIN 250 MG PO TABS: ORAL_TABLET | ORAL | Status: AC

## 2011-11-06 MED ORDER — FLUTICASONE PROPIONATE 50 MCG/ACT NA SUSP: 1.0000 | Freq: Two times a day (BID) | NASAL | Status: DC | PRN

## 2011-11-06 MED ORDER — HYDROCODONE-HOMATROPINE 5-1.5 MG/5ML PO SYRP: 5.0000 mL | ORAL_SOLUTION | Freq: Four times a day (QID) | ORAL | Status: AC | PRN

## 2011-11-06 NOTE — Patient Instructions (Signed)
Plain Mucinex for thick secretions ;force NON dairy fluids . Use a Neti pot daily as needed for sinus congestion .Nasal cleansing in the shower as discussed. Make sure that all residual soap is removed to prevent irritation. Flonase 1 spray in each nostril twice a day as needed. Use the "crossover" technique as discussed   

## 2011-11-06 NOTE — Progress Notes (Signed)
  Subjective:    Patient ID: Heather Ramsey, female    DOB: Aug 28, 1946, 66 y.o.   MRN: [id number]  HPI Respiratory tract infection Onset/symptoms:2-3 weeks ago as ST X 3 days Exposures (illness/environmental/extrinsic):church members Progression of symptoms:to ead congestion Treatments/response:old cough Rx Present symptoms: Fever/chills/sweats:low grade fever Frontal headache:no Facial pain:no Nasal purulence:scant yellow Dental pain:no Lymphadenopathy:no Wheezing/shortness of breath:wheezing & "rattle" Cough/sputum/hemoptysis:wet cough; sputum swallowed Pleuritic pain:no Associated extrinsic/allergic symptoms:itchy eyes/ sneezing:no Past medical history: Seasonal allergies /asthma: no Smoking history:never           Review of Systems her reflux symptoms are well controlled with a PPI. She is not on ACE inhibitor     Objective:   Physical Exam General appearance:good health ;well nourished; no acute distress or increased work of breathing is present.  Minor left cervical posterior  lymphadenopathy .None in axilla    Eyes: No conjunctival inflammation or lid edema is present.   Ears:  External ear exam shows no significant lesions or deformities.  Otoscopic examination reveals clear canals, tympanic membranes are intact bilaterally without bulging, retraction, inflammation or discharge.  Nose:  External nasal examination shows no deformity or inflammation. Nasal mucosa are dry without lesions or exudates. No septal dislocation or deviation.No obstruction to airflow.   Oral exam: Dental hygiene is good; lips and gums are healthy appearing.There is no oropharyngeal erythema or exudate noted.    Heart:  Normal rate and regular rhythm. S1 and S2 normal without gallop, murmur, click, rub or other extra sounds.   Lungs:Chest clear to auscultation; no wheezes, rhonchi,rales ,or rubs present.No increased work of breathing.    Extremities:  No cyanosis, edema, or clubbing   noted    Skin: Warm & dry          Assessment & Plan:  #1 bronchitis, acute. She describes some wheezing but clinically this is not present on exam. She has some upper respiratory tract congestion without frank rhinosinusitis symptoms.  Plan: See orders and recommendations

## 2011-11-13 ENCOUNTER — Telehealth: Payer: Self-pay | Admitting: Internal Medicine

## 2011-11-13 MED ORDER — AZITHROMYCIN 250 MG PO TABS: ORAL_TABLET | ORAL | Status: DC

## 2011-11-13 NOTE — Telephone Encounter (Signed)
OK but take 1 pill qd X 6 days, not 2 day 1

## 2011-11-13 NOTE — Telephone Encounter (Signed)
Patient states she was seen by Dr. Alwyn Ren last week and was prescribed a Z-pak for bronchitis. Patient states symptoms have returned 2 days after finishing medication. Would like another Z-pak called in to [facility] [address].

## 2011-11-13 NOTE — Telephone Encounter (Signed)
Patient aware rx sent in  

## 2012-01-08 ENCOUNTER — Encounter: Payer: Self-pay | Admitting: Gynecology

## 2012-01-08 DIAGNOSIS — N952 Postmenopausal atrophic vaginitis: Secondary | ICD-10-CM | POA: Insufficient documentation

## 2012-01-17 ENCOUNTER — Encounter: Payer: Medicare Other | Admitting: Obstetrics and Gynecology

## 2012-02-04 ENCOUNTER — Encounter: Payer: Self-pay | Admitting: Internal Medicine

## 2012-02-05 ENCOUNTER — Ambulatory Visit (INDEPENDENT_AMBULATORY_CARE_PROVIDER_SITE_OTHER): Payer: Medicare Other | Admitting: Cardiology

## 2012-02-05 ENCOUNTER — Encounter: Payer: Self-pay | Admitting: Cardiology

## 2012-02-05 DIAGNOSIS — R079 Chest pain, unspecified: Secondary | ICD-10-CM

## 2012-02-05 DIAGNOSIS — R9431 Abnormal electrocardiogram [ECG] [EKG]: Secondary | ICD-10-CM | POA: Insufficient documentation

## 2012-02-05 DIAGNOSIS — E785 Hyperlipidemia, unspecified: Secondary | ICD-10-CM

## 2012-02-05 NOTE — Assessment & Plan Note (Signed)
Her EKG today demonstrates some reduced anterior R wave progression it is a variant of normal. She has no suggestion of prior myocardial event or ongoing symptoms. No further cardiovascular testing is suggested. I would suggest an exercise treadmill perhaps in 2 years as it will have been 5 years since her previous and she does have a strong family history. We discussed at length primary risk reduction. In particular I suggested increasing her exercise to 5 days per week.

## 2012-02-05 NOTE — Progress Notes (Signed)
HPI The patient presents for evaluation of an abnormal EKG. She's had 2 EKGs in the past suggesting a possible old anteroseptal myocardial infarction. She herself has not had any prior cardiac history. She reports a cardiac catheterization many years ago. I was able to find a perfusion study in 2010 that demonstrated no ischemia or infarct. She does have a strong family history of early heart disease however. She does exercise though she's somewhat limited by fibromyalgia. She will walk and do the elliptical as well as stretching exercises.  The patient denies any new symptoms such as chest discomfort, neck or arm discomfort. There has been no new shortness of breath, PND or orthopnea. There have been no reported palpitations, presyncope or syncope.   Allergies  Allergen Reactions  . Clindamycin     angioedema  . Levofloxacin     Headache; dysphasia X 30 min  . Atorvastatin     REACTION: MUSCLE ACHES/MEMORY  . Risedronate Sodium     REACTION: SEVERE HEADACHES    Current Outpatient Prescriptions  Medication Sig Dispense Refill  . aspirin 81 MG tablet Take 81 mg by mouth daily. 1/2 tab daily      . b complex vitamins tablet Take 1 tablet by mouth daily.        . benzocaine-resorcinol (VAGISIL) 5-2 % vaginal cream Place vaginally at bedtime.      . Calcium Carbonate-Vitamin D (CALCIUM + D PO) Take by mouth daily.      . Coenzyme Q10 (COQ10) 100 MG CAPS Take by mouth 3 (three) times daily.        Marland Kitchen estradiol (VAGIFEM) 25 MCG vaginal tablet Place 10 mcg vaginally daily.      . fish oil-omega-3 fatty acids 1000 MG capsule Take 2 g by mouth daily.        . folic acid (FOLVITE) 400 MCG tablet Take 400 mcg by mouth daily.        . Ginger, Zingiber officinalis, (GINGER ROOT) 550 MG CAPS Take by mouth daily.        Stephanie Acre (GLUCOSAMINE MSM COMPLEX PO) Take by mouth 2 times daily at 12 noon and 4 pm.        . lidocaine (LIDODERM) 5 % Place 1 patch onto the skin daily.  Remove & Discard patch within 12 hours or as directed by MD       . magnesium 30 MG tablet Take 30 mg by mouth 2 (two) times daily.        . Misc Natural Products (PROGESTERONE EX) Apply 480 mg topically.      . Misc Natural Products (SUPER GRAPEFRUIT N FIBER DIET PO) Take 1,000 mg by mouth daily.        . nitrofurantoin (MACRODANTIN) 50 MG capsule Take 50 mg by mouth as needed.      Marland Kitchen omeprazole (PRILOSEC) 20 MG capsule Take 20 mg by mouth daily.      . Red Yeast Rice Extract (RED YEAST RICE PO) Take by mouth.        . TURMERIC PO Take 400 mg by mouth daily.        Past Medical History  Diagnosis Date  . IBS (irritable bowel syndrome)   . Fibromyalgia   . Pulmonary nodule 07/2006  . Hyperlipidemia   . Osteopenia     T score -1.7 @ femoral neck stable  . UTI (lower urinary tract infection) 2005    Citrobacter koseri, hospitalized w/ kidney infection 1983  . Chest  pain 2007    negative  cardiac evaluation  . Diverticulosis of colon 2005& 2010    FH colon cancer  . Atrophic vaginitis     Past Surgical History  Procedure Date  . Breast biopsy 2003  . Bladder tacking 1997  . Burch procedure     Family History  Problem Relation Age of Onset  . Heart attack Father 85  . Colon cancer Father   . Diabetes Father   . Tuberculosis Father   . Tuberculosis Paternal Grandmother   . Stroke Mother   . Hypothyroidism Mother   . Hypertension Mother   . Hypothyroidism Sister   . Hypertension Sister   . Breast cancer Sister   . Diabetes Sister     History   Social History  . Marital Status: Married    Spouse Name: N/A    Number of Children: N/A  . Years of Education: N/A   Occupational History  . Not on file.   Social History Main Topics  . Smoking status: Never Smoker   . Smokeless tobacco: Not on file  . Alcohol Use: No  . Drug Use: No  . Sexually Active: Yes    Birth Control/ Protection: Post-menopausal   Other Topics Concern  . Not on file   Social History  Narrative   Regular exercise- yes: walks/elliptical 3x/week x 30 mins    ROS:  Positive for dizziness, diarrhea constipation, reflux  Otherwise as stated in the HPI and negative for all other systems.  PHYSICAL EXAM BP 130/89  Pulse 72  Ht 5\' 1"  (1.549 m)  Wt 121 lb 6.4 oz (55.067 kg)  BMI 22.94 kg/m2 GENERAL:  Well appearing HEENT:  Pupils equal round and reactive, fundi not visualized, oral mucosa unremarkable NECK:  No jugular venous distention, waveform within normal limits, carotid upstroke brisk and symmetric, no bruits, no thyromegaly LYMPHATICS:  No cervical, inguinal adenopathy LUNGS:  Clear to auscultation bilaterally BACK:  No CVA tenderness CHEST:  Unremarkable HEART:  PMI not displaced or sustained,S1 and S2 within normal limits, no S3, no S4, no clicks, no rubs, no murmurs ABD:  Flat, positive bowel sounds normal in frequency in pitch, no bruits, no rebound, no guarding, no midline pulsatile mass, no hepatomegaly, no splenomegaly EXT:  2 plus pulses throughout, no edema, no cyanosis no clubbing SKIN:  No rashes no nodules NEURO:  Cranial nerves II through XII grossly intact, motor grossly intact throughout PSYCH:  Cognitively intact, oriented to person place and time   EKG:  Sinus rhythm, rate 72 and a 04 and we will there is an area of for her irregular her history or, axis within normal limits, intervals within normal limits, no acute ST-T wave changes.  02/05/2012   ASSESSMENT AND PLAN

## 2012-02-05 NOTE — Patient Instructions (Signed)
The current medical regimen is effective;  continue present plan and medications.  Follow up as needed 

## 2012-02-05 NOTE — Assessment & Plan Note (Signed)
I reviewed her lipid profile. She had an LDL of greater than 170. I have suggested a goal of less than 160. She has lost a little weight. She would like to avoid medications and she does have a high HDL. It is reasonable after diet and exercise to repeat though I would suggest at least pravastatin if she is not below target of 160.

## 2012-02-07 ENCOUNTER — Ambulatory Visit: Payer: Medicare Other | Admitting: Cardiology

## 2012-03-31 ENCOUNTER — Encounter: Payer: Self-pay | Admitting: *Deleted

## 2012-04-11 ENCOUNTER — Ambulatory Visit (INDEPENDENT_AMBULATORY_CARE_PROVIDER_SITE_OTHER): Payer: Medicare Other | Admitting: Internal Medicine

## 2012-04-11 ENCOUNTER — Encounter: Payer: Self-pay | Admitting: Internal Medicine

## 2012-04-11 VITALS — BP 120/78 | HR 64 | Ht 61.5 in | Wt 121.4 lb

## 2012-04-11 DIAGNOSIS — K589 Irritable bowel syndrome without diarrhea: Secondary | ICD-10-CM

## 2012-04-11 DIAGNOSIS — K648 Other hemorrhoids: Secondary | ICD-10-CM

## 2012-04-11 MED ORDER — ALIGN 4 MG PO CAPS: 1.0000 | ORAL_CAPSULE | Freq: Every day | ORAL | Status: DC

## 2012-04-11 MED ORDER — HYDROCORTISONE ACETATE 25 MG RE SUPP: 25.0000 mg | Freq: Every evening | RECTAL | Status: AC | PRN

## 2012-04-11 MED ORDER — MAGNESIUM OXIDE -MG SUPPLEMENT 500 MG PO TABS: 1.0000 | ORAL_TABLET | Freq: Every day | ORAL | Status: DC

## 2012-04-11 MED ORDER — PSYLLIUM 28.3 % PO POWD: ORAL | Status: DC

## 2012-04-11 NOTE — Progress Notes (Signed)
Heather Ramsey 1946/02/13 MRN [id number]   History of Present Illness:  This is a 66 year old white female with chronic constipation with occasional breakthrough diarrhea and symptomatic hemorrhoids. She has external prolapsed hemorrhoids treated with prescription suppositories. The hemorrhoids becomes irritated after having diarrhea or after she strains. She has occasional small volume hematochezia. She had a full colonoscopy for screening in October 2010 because of her family history of colon cancer in her father. She was found to have moderately severe diverticulosis of the left colon. She has had fibromyalgia for the past 25 years. Her bowel regimen includes magnesium tablets 30 mg and 1 scoop of Metamucil daily.   Past Medical History  Diagnosis Date  . IBS (irritable bowel syndrome)   . Fibromyalgia   . Pulmonary nodule 07/2006  . Hyperlipidemia   . Osteopenia     T score -1.7 @ femoral neck stable  . UTI (lower urinary tract infection) 2005    Citrobacter koseri, hospitalized w/ kidney infection 1983  . Chest pain     negative  cardiac evaluation  . Diverticulosis of colon 2005& 2010    FH colon cancer  . Atrophic vaginitis   . Hemorrhoid   . MVP (mitral valve prolapse)   . Migraine    Past Surgical History  Procedure Date  . Breast biopsy 2003  . Burch procedure     with bladder tack     reports that she has never smoked. She does not have any smokeless tobacco history on file. She reports that she does not drink alcohol or use illicit drugs. family history includes Breast cancer in her sister; Colon cancer (age of onset:67) in her father; Coronary artery disease (age of onset:66) in her sister; Coronary artery disease (age of onset:70) in her sister; Diabetes in her father and sister; Heart attack (age of onset:40) in her father; Hypertension in her mother and sister; Hypothyroidism in her mother and sister; Stroke in her mother; and Tuberculosis in her father and  paternal grandmother. Allergies  Allergen Reactions  . Clindamycin     angioedema  . Levofloxacin     Headache; dysphasia X 30 min  . Atorvastatin     REACTION: MUSCLE ACHES/MEMORY  . Risedronate Sodium     REACTION: SEVERE HEADACHES        Review of Systems: Denies abdominal pain. No fever or weight loss  The remainder of the 10 point ROS is negative except as outlined in H&P   Physical Exam: General appearance  Well developed, in no distress. Eyes- non icteric. HEENT nontraumatic, normocephalic. Mouth no lesions, tongue papillated, no cheilosis. Neck supple without adenopathy, thyroid not enlarged, no carotid bruits, no JVD. Lungs Clear to auscultation bilaterally. Cor normal S1, normal S2, regular rhythm, no murmur,  quiet precordium. Abdomen: Soft relaxed abdomen with minimal tenderness in both lower quadrant. No mass. No distention. Liver edge at costal margin. Rectal: And anoscopic exam reveals first grade external hemorrhoids circumferentially around the anal canal. Rectal sphincter tone is somewhat weak. There are first grade internal hemorrhoids which don't appear to be bleeding or thrombosed. Stool is Hemoccult negative. There is no prolapse of internal hemorrhoids. Extremities no pedal edema. Skin no lesions. Neurological alert and oriented x 3. Psychological normal mood and affect.  Assessment and Plan:  Problem #1 Internal and external hemorrhoids are symptomatic. He have discussed hemorrhoidal band ligation versus continuing Anusol-HC suppositories. Before banding her hemorrhoids, I would like to normalize her bowel habits which are constipated.  Problem #2  Chronic constipation with breakthrough diarrhea. She will increase Metamucil to 2 scoops a day. She will add magnesium oxide 500 mg daily and will be given samples of a probiotic to take daily. I would like to see her in 3 months. We will then reassess whether we should go ahead with the band ligation. I told  her that her external hemorrhoids cannot be banded because of the pain associated with that and that she would have to be referred to a surgeon for [facility] procedure, but usually the external hemorrhoids get better once we band the internal hemorrhoids.  Problem #3 Positive family history of colorectal cancer. She is up-to-date on her colonoscopy. Her next exam will be due in October 2015.  04/11/2012 [name]

## 2012-04-11 NOTE — Patient Instructions (Addendum)
We have sent the following medications to your pharmacy for you to pick up at your convenience: Hydrocortisone Suppositories Please purchase Magnesium Oxide 500 mg by mouth once daily. Metamucil- Take 1 scoop dissolved in at least 8 ounces water/juice and drink twice daily. We have given you samples of Align. This puts good bacteria back into your colon. You should take 1 capsule by mouth once daily. If this works well for you, it can be purchased over the counter. CC: Dr Marga Melnick, Dr Candice Camp

## 2012-05-05 ENCOUNTER — Telehealth: Payer: Self-pay | Admitting: Internal Medicine

## 2012-05-05 NOTE — Telephone Encounter (Signed)
Patient asking if Dr. Juanda Chance does hemorrhoid banding on a regular basis. Discussed with patient that she does do the procedure frequently.

## 2012-07-09 ENCOUNTER — Encounter: Payer: Self-pay | Admitting: Internal Medicine

## 2012-07-29 ENCOUNTER — Encounter: Payer: Self-pay | Admitting: Cardiovascular Disease

## 2012-08-05 ENCOUNTER — Ambulatory Visit (INDEPENDENT_AMBULATORY_CARE_PROVIDER_SITE_OTHER): Payer: Medicare Other | Admitting: Internal Medicine

## 2012-08-05 ENCOUNTER — Encounter: Payer: Self-pay | Admitting: Internal Medicine

## 2012-08-05 VITALS — BP 120/78 | HR 56 | Ht 61.5 in | Wt 121.0 lb

## 2012-08-05 DIAGNOSIS — K648 Other hemorrhoids: Secondary | ICD-10-CM

## 2012-08-05 DIAGNOSIS — K5900 Constipation, unspecified: Secondary | ICD-10-CM

## 2012-08-05 DIAGNOSIS — K625 Hemorrhage of anus and rectum: Secondary | ICD-10-CM

## 2012-08-05 DIAGNOSIS — K649 Unspecified hemorrhoids: Secondary | ICD-10-CM

## 2012-08-05 NOTE — Progress Notes (Signed)
Heather Ramsey 25-Oct-1945 MRN [id number]  History of Present Illness:  This is a 66 year old white female with chronic constipation and symptomatic external and internal hemorrhoids. She is here today to discuss possible hemorrhoid surgery. We saw her in July 2013 for the same problem and for a family history of colon cancer. Her last colonoscopy in October 2010 showed moderately severe diverticulosis and hemorrhoids. She has been on the regimen of magnesium oxide 500 mg a day, Metamucil 2 teaspoons daily and probiotics. She sees small amounts of blood with each bowel movement on the toilet tissue as well as in the commode. She has used Anusol-HC suppositories which, according to her, are not effective. She complains of odor and difficult cleaning.   Past Medical History  Diagnosis Date  . IBS (irritable bowel syndrome)   . Fibromyalgia   . Pulmonary nodule 07/2006  . Hyperlipidemia   . Osteopenia     T score -1.7 @ femoral neck stable  . UTI (lower urinary tract infection) 2005    Citrobacter koseri, hospitalized w/ kidney infection 1983  . Chest pain     negative  cardiac evaluation  . Diverticulosis of colon 2005& 2010    FH colon cancer  . Atrophic vaginitis   . Hemorrhoid   . MVP (mitral valve prolapse)   . Migraine    Past Surgical History  Procedure Date  . Breast biopsy 2003  . Burch procedure     with bladder tack     reports that she has never smoked. She has never used smokeless tobacco. She reports that she does not drink alcohol or use illicit drugs. family history includes Breast cancer in her sister; Colon cancer (age of onset:67) in her father; Coronary artery disease (age of onset:66) in her sister; Coronary artery disease (age of onset:70) in her sister; Diabetes in her father and sister; Heart attack (age of onset:40) in her father; Hypertension in her mother and sister; Hypothyroidism in her mother and sister; Stroke in her mother; and Tuberculosis in her father  and paternal grandmother. Allergies  Allergen Reactions  . Clindamycin     angioedema  . Levofloxacin     Headache; dysphasia X 30 min  . Atorvastatin     REACTION: MUSCLE ACHES/MEMORY  . Risedronate Sodium     REACTION: SEVERE HEADACHES        Review of Systems: Negative for abdominal pain chest pain shortness of breath  The remainder of the 10 point ROS is negative except as outlined in H&P   Physical Exam: General appearance  Well developed, in no distress. Eyes- non icteric. HEENT nontraumatic, normocephalic. Mouth no lesions, tongue papillated, no cheilosis. Neck supple without adenopathy, thyroid not enlarged, no carotid bruits, no JVD. Lungs Clear to auscultation bilaterally. Cor normal S1, normal S2, regular rhythm, no murmur,  quiet precordium. Abdomen: Soft nontender with normoactive bowel sounds. No distention. Rectal: And anoscopic exam reveals first grade external hemorrhoids which cannot be reduced with digital exam, normal rectal sphincter tone. First grade internal hemorrhoids, hyperemic red and blue color, edematous, but no active bleeding. stool is Hemoccult-negative. Extremities no pedal edema. Skin no lesions. Neurological alert and oriented x 3. Psychological normal mood and affect.  Assessment and Plan:  Problem #1 Symptomatic internal and external hemorrhoids with failure of conservative therapy. Patient is interested in the surgical intervention. We have discussed band ligation which is the effective in internal hemorrhoid treatment but she would like to consider reduction of her external hemorrhoids as well.  We will make a referral to Dr. Maisie Fus for evaluation.   08/05/2012 Heather Ramsey

## 2012-08-05 NOTE — Patient Instructions (Addendum)
You have been scheduled for an appointment with Dr Romie Levee at [facility]. Your appointment is on __________ at __________. Please arrive at ___________ for registration. Make certain to bring a list of current medications, including any over the counter medications or vitamins. Also bring your co-pay if you have one as well as your insurance cards. [facility] is located at [address], [address]. Should you need to reschedule your appointment, please contact them at [phone].  CC: Dr Romie Levee, Dr Marga Melnick

## 2012-08-19 ENCOUNTER — Encounter (INDEPENDENT_AMBULATORY_CARE_PROVIDER_SITE_OTHER): Payer: Self-pay | Admitting: General Surgery

## 2012-08-19 ENCOUNTER — Ambulatory Visit (INDEPENDENT_AMBULATORY_CARE_PROVIDER_SITE_OTHER): Payer: Medicare Other | Admitting: General Surgery

## 2012-08-19 VITALS — BP 126/80 | HR 63 | Temp 97.0°F | Ht 61.5 in | Wt 121.4 lb

## 2012-08-19 DIAGNOSIS — K649 Unspecified hemorrhoids: Secondary | ICD-10-CM

## 2012-08-19 NOTE — Patient Instructions (Signed)

## 2012-08-19 NOTE — Progress Notes (Addendum)
Chief Complaint  Patient presents with  . Pre-op Exam    eval hems  . Rectal Problems    HISTORY: Heather Ramsey is a 66 y.o. female who presents to the office with rectal bleeding.  Other symptoms include occasional pain and fecal leakage.  She has tried hemorrhoid creams in the past with some success.  Hard stools makes the symptoms worse.  This had been occurring for multiple years.  It is intermittent in nature.  Her bowel habits are somewhat irregular but getting better and her bowel movements vary in consistancy.  Her fiber intake is good.  Her last colonoscopy was in 2010 and showed diverticulosis.  She is due again in 2015.  She does have prolapsing tissue.   She also has some fecal leakage on occasion and complete fecal incontinence about 3 times a year.   Past Medical History  Diagnosis Date  . IBS (irritable bowel syndrome)   . Fibromyalgia   . Pulmonary nodule 07/2006  . Hyperlipidemia   . Osteopenia     T score -1.7 @ femoral neck stable  . UTI (lower urinary tract infection) 2005    Citrobacter koseri, hospitalized w/ kidney infection 1983  . Chest pain     negative  cardiac evaluation  . Diverticulosis of colon 2005& 2010    FH colon cancer  . Atrophic vaginitis   . Hemorrhoid   . MVP (mitral valve prolapse)   . Migraine       Past Surgical History  Procedure Date  . Breast biopsy 2003  . Burch procedure     with bladder tack         Current Outpatient Prescriptions  Medication Sig Dispense Refill  . amoxicillin (AMOXIL) 500 MG capsule       . aspirin 81 MG tablet Take 81 mg by mouth daily. 1/2 tab daily      . b complex vitamins tablet Take 1 tablet by mouth daily.        . benzocaine-resorcinol (VAGISIL) 5-2 % vaginal cream Place vaginally at bedtime.      . Calcium Carbonate-Vitamin D (CALCIUM + D PO) Take by mouth daily.      . Coenzyme Q10 (COQ10) 100 MG CAPS Take by mouth 3 (three) times daily.        Marland Kitchen estradiol (VAGIFEM) 25 MCG vaginal tablet  Place 10 mcg vaginally 2 (two) times a week.       . fish oil-omega-3 fatty acids 1000 MG capsule Take 2 g by mouth daily.        . folic acid (FOLVITE) 400 MCG tablet Take 400 mcg by mouth daily.        . Ginger, Zingiber officinalis, (GINGER ROOT) 550 MG CAPS Take by mouth daily.        Stephanie Acre (GLUCOSAMINE MSM COMPLEX PO) Take by mouth 2 times daily at 12 noon and 4 pm.        . lidocaine (LIDODERM) 5 % Place 1 patch onto the skin daily. Remove & Discard patch within 12 hours or as directed by MD       . magnesium 30 MG tablet Take 30 mg by mouth 2 (two) times daily.        . Magnesium Oxide 500 MG TABS Take 1 tablet (500 mg total) by mouth daily.  1 tablet  0  . Misc Natural Products (PROGESTERONE EX) Apply 480 mg topically.      . Misc Natural Products (SUPER GRAPEFRUIT  N FIBER DIET PO) Take 1,000 mg by mouth daily.        . nitrofurantoin (MACRODANTIN) 50 MG capsule Take 50 mg by mouth as needed.      Marland Kitchen omeprazole (PRILOSEC) 20 MG capsule Take 20 mg by mouth as needed.       . Probiotic Product (ALIGN) 4 MG CAPS Take 1 capsule by mouth daily.  14 capsule  0  . Psyllium (METAMUCIL) 28.3 % POWD Take 1 scoop dissolved in at least 8 ounces water/juice and drink twice daily  1 Bottle  0  . Red Yeast Rice Extract (RED YEAST RICE PO) Take by mouth.        . TURMERIC PO Take 400 mg by mouth daily.          Allergies  Allergen Reactions  . Clindamycin     angioedema  . Levofloxacin     Headache; dysphasia X 30 min  . Atorvastatin     REACTION: MUSCLE ACHES/MEMORY  . Risedronate Sodium     REACTION: SEVERE HEADACHES      Family History  Problem Relation Age of Onset  . Heart attack Father 30    Died with multiple medical problems  . Colon cancer Father 40  . Diabetes Father   . Tuberculosis Father   . Tuberculosis Paternal Grandmother   . Stroke Mother   . Hypothyroidism Mother   . Hypertension Mother   . Hypothyroidism Sister   . Hypertension Sister   .  Breast cancer Sister   . Diabetes Sister   . Coronary artery disease Sister 73  . Coronary artery disease Sister 40    History   Social History  . Marital Status: Married    Spouse Name: N/A    Number of Children: 2  . Years of Education: N/A   Social History Main Topics  . Smoking status: Never Smoker   . Smokeless tobacco: Never Used  . Alcohol Use: No  . Drug Use: No  . Sexually Active: Yes    Birth Control/ Protection: Post-menopausal   Other Topics Concern  . None   Social History Narrative   Regular exercise- yes: walks/elliptical 3x/week x 30 mins.  Lives with husband.      REVIEW OF SYSTEMS - PERTINENT POSITIVES ONLY: Review of Systems - General ROS: negative for - chills or fever Hematological and Lymphatic ROS: negative for - bleeding problems or blood clots Gastrointestinal ROS: no abdominal pain, change in bowel habits, or black or bloody stools Genito-Urinary ROS: no dysuria, trouble voiding, or hematuria  EXAM: Filed Vitals:   08/19/12 1105  BP: 126/80  Pulse: 63  Temp: 97 F (36.1 C)    General appearance: alert and cooperative Resp: clear to auscultation bilaterally Cardio: regular rate and rhythm GI: normal findings: soft, non-tender   Procedure: Anoscopy Surgeon: Maisie Fus Diagnosis: rectal bleeding  Assistant: Christella Scheuermann After the risks and benefits were explained, verbal consent was obtained for above procedure  Anesthesia: none Findings: prolapsed internal hemorrhoids- grade 4, mild external hemorrhoids, L lateral is largest. Good tone   ASSESSMENT AND PLAN:  We discussed the various treatments for internal hemorrhoids.  We do not have the equipment to perform laser ablation.  We talked about other options including banding, hemorrhoidopexy and hemorrhoidectomy.  Given her advanced hemorrhoid disease, I doubt that banding will be effective.  We discussed THD, but I told her that there is a possibility that her fecal leakage could get worse  after this, as hemorrhoidal  tissue does contribute some to continence.  We decided to perform anal manometry first to evaluate her sphincter function.  If she has little tone, I would suggest possible injection or banding first.  If her tone is ok, we will proceed with THD.   Vanita Panda, MD Colon and Rectal Surgery / General Surgery [facility]      Visit Diagnoses: 1. Hemorrhoid     Primary Care Physician: Marga Melnick, MD

## 2012-09-24 HISTORY — PX: FOOT SURGERY: SHX648

## 2012-09-29 ENCOUNTER — Ambulatory Visit (HOSPITAL_COMMUNITY)
Admission: RE | Admit: 2012-09-29 | Discharge: 2012-09-29 | Disposition: A | Discharge status: Home / Self Care | Payer: Medicare Other | Source: Ambulatory Visit | Attending: General Surgery | Admitting: General Surgery

## 2012-09-29 ENCOUNTER — Encounter (HOSPITAL_COMMUNITY)
Admission: RE | Disposition: A | Discharge status: Home / Self Care | Payer: Self-pay | Source: Ambulatory Visit | Attending: General Surgery

## 2012-09-29 DIAGNOSIS — K6289 Other specified diseases of anus and rectum: Secondary | ICD-10-CM | POA: Insufficient documentation

## 2012-09-29 DIAGNOSIS — R159 Full incontinence of feces: Secondary | ICD-10-CM | POA: Insufficient documentation

## 2012-09-29 DIAGNOSIS — R150 Incomplete defecation: Secondary | ICD-10-CM

## 2012-09-29 DIAGNOSIS — K5900 Constipation, unspecified: Secondary | ICD-10-CM | POA: Insufficient documentation

## 2012-09-29 DIAGNOSIS — K649 Unspecified hemorrhoids: Secondary | ICD-10-CM

## 2012-09-29 SURGERY — MANOMETRY, ANORECTAL

## 2012-10-08 ENCOUNTER — Encounter: Payer: Self-pay | Admitting: Internal Medicine

## 2012-10-08 ENCOUNTER — Ambulatory Visit (INDEPENDENT_AMBULATORY_CARE_PROVIDER_SITE_OTHER): Payer: Medicare Other | Admitting: Internal Medicine

## 2012-10-08 VITALS — BP 110/78 | HR 56 | Temp 97.6°F | Resp 12 | Ht 61.5 in | Wt 124.0 lb

## 2012-10-08 DIAGNOSIS — K5730 Diverticulosis of large intestine without perforation or abscess without bleeding: Secondary | ICD-10-CM

## 2012-10-08 DIAGNOSIS — IMO0001 Reserved for inherently not codable concepts without codable children: Secondary | ICD-10-CM

## 2012-10-08 DIAGNOSIS — R9431 Abnormal electrocardiogram [ECG] [EKG]: Secondary | ICD-10-CM

## 2012-10-08 DIAGNOSIS — Z0000 Encounter for general adult medical examination without abnormal findings: Secondary | ICD-10-CM

## 2012-10-08 DIAGNOSIS — M899 Disorder of bone, unspecified: Secondary | ICD-10-CM

## 2012-10-08 DIAGNOSIS — R5382 Chronic fatigue, unspecified: Secondary | ICD-10-CM

## 2012-10-08 DIAGNOSIS — M949 Disorder of cartilage, unspecified: Secondary | ICD-10-CM

## 2012-10-08 DIAGNOSIS — E785 Hyperlipidemia, unspecified: Secondary | ICD-10-CM

## 2012-10-08 LAB — CBC WITH DIFFERENTIAL/PLATELET
Eosinophils Absolute: 0.1 10*3/uL (ref 0.0–0.7)
Lymphs Abs: 1.2 10*3/uL (ref 0.7–4.0)
MCHC: 33.3 g/dL (ref 30.0–36.0)
MCV: 92.3 fl (ref 78.0–100.0)
Monocytes Absolute: 0.3 10*3/uL (ref 0.1–1.0)
Neutrophils Relative %: 78.3 % — ABNORMAL HIGH (ref 43.0–77.0)
Platelets: 260 10*3/uL (ref 150.0–400.0)

## 2012-10-08 LAB — HEPATIC FUNCTION PANEL
Bilirubin, Direct: 0 mg/dL (ref 0.0–0.3)
Total Bilirubin: 0.7 mg/dL (ref 0.3–1.2)
Total Protein: 7.2 g/dL (ref 6.0–8.3)

## 2012-10-08 LAB — LIPID PANEL
Cholesterol: 260 mg/dL — ABNORMAL HIGH (ref 0–200)
HDL: 83.6 mg/dL (ref >39.00)
Total CHOL/HDL Ratio: 3
Triglycerides: 85 mg/dL (ref 0.0–149.0)

## 2012-10-08 LAB — BASIC METABOLIC PANEL
BUN: 13 mg/dL (ref 6–23)
CO2: 28 mEq/L (ref 19–32)
Chloride: 101 mEq/L (ref 96–112)
Creatinine, Ser: 0.8 mg/dL (ref 0.4–1.2)
Glucose, Bld: 95 mg/dL (ref 70–99)

## 2012-10-08 NOTE — Progress Notes (Signed)
Subjective:    Patient ID: Heather Ramsey, female    DOB: 1945/10/07, 67 y.o.   MRN: [id number]  HPI Medicare Wellness Visit:  The following psychosocial & medical history were reviewed as required by Medicare.   Social history: caffeine: 6 cups / day , alcohol: none ,  tobacco use : never  & exercise : 2-3 x/ week for 30 min.   Home & personal  safety / fall risk: no issues, activities of daily living: nolimitations , seatbelt use : yes , and smoke alarm employment : yes .  Power of Attorney/Living Will status : in place  Vision ( as recorded per Nurse) & Hearing  evaluation : Ophth exam < 12 mos;  hearing exam 6 mos ago. Orientation :oriented x 3 , memory & recall :good, spelling  testing: good,and mood & affect : normal . Depression / anxiety: denied Travel history : 2011 [place] , immunization status :up to date, transfusion history:  no, and preventive health surveillance ( colonoscopies, BMD , etc as per protocol/ [facility]): colonoscopy , BMD & mammograms up to date, Dental care: every 6 mos . Chart reviewed &  Updated. Active issues reviewed & addressed.       Review of Systems Dyslipidemia assessment: Prior Advanced Lipid Testing: NMR Lipoprofile LDL goal = < 160, ideally < 130.   Family history of premature CAD/ MI: father MI in 64s .  Nutrition: no plan .  Diabetes : no . HTN: no.    Weight :up 8#  fatigue: intermittent ; chest pain : no ;claudication: no; palpitations: no; abd pain/bowel changes: no ; myalgias:no;  syncope : no ; memory loss: no;skin changes: no    Objective:   Physical Exam Gen.: well-nourished in appearance. Alert, appropriate and cooperative throughout exam. Appears younger than stated age  Head: Normocephalic without obvious abnormalities; hair fine Eyes: No corneal or conjunctival inflammation noted. Pupils equal round reactive to light and accommodation. Fundal exam is benign without hemorrhages, exudate, papilledema. Extraocular motion intact.  Vision grossly normal with lenses. Ears: External  ear exam reveals no significant lesions or deformities. Some wax on R. L TM normal. Hearing is grossly normal bilaterally. Nose: External nasal exam reveals no deformity or inflammation. Nasal mucosa are pink and moist. No lesions or exudates noted.   Mouth: Oral mucosa and oropharynx reveal no lesions or exudates. Teeth in good repair. Neck: No deformities, masses, or tenderness noted. Range of motion & Thyroid normal. Lungs: Normal respiratory effort; chest expands symmetrically. Lungs are clear to auscultation without rales, wheezes, or increased work of breathing. Heart: Normal rate and rhythm. Normal S1 and S2. No gallop, click, or rub. S4 w/o murmur. Abdomen: Bowel sounds normal; abdomen soft and nontender. No masses, organomegaly or hernias noted. Genitalia: as per Gyn                                   Musculoskeletal/extremities: No deformity or scoliosis noted of  the thoracic or lumbar spine. No clubbing, cyanosis, edema, or significant extremity  deformity noted. Range of motion normal .Tone & strength  normal.Joints normal. Nail health good; onycholysis R thumbnail. Able to lie down & sit up w/o help. Negative SLR bilaterally Vascular: Carotid, radial artery, dorsalis pedis and  posterior tibial pulses are full and equal. No bruits present. Neurologic: Alert and oriented x3. Deep tendon reflexes symmetrical and normal.  Skin: Intact without suspicious lesions or  rashes. Lymph: No cervical, axillary lymphadenopathy present. Psych: Mood and affect are normal. Normally interactive                                                                                        Assessment & Plan:  #1 Medicare Wellness Exam; criteria met ; data entered #2 Problem List reviewed ; Assessment/ Recommendations made Plan: see Orders

## 2012-10-08 NOTE — Patient Instructions (Addendum)
Preventive Health Care: Exercise  30-45  minutes a day, 3-4 days a week. Walking is especially valuable in preventing Osteoporosis. Eat a low-fat diet with lots of fruits and vegetables, up to 7-9 servings per day.  Consume less than 30 grams of sugar per day from foods & drinks with High Fructose Corn Syrup as #1,2,3 or #4 on label. Take the EKG to any emergency room or preop visits. There are nonspecific changes; as long as there is no new change these are not clinically significant . If the old EKG is not available for comparison; it may result in unnecessary hospitalization for observation with significant unnecessary expense.  If you activate My Chart; the results can be released to you as soon as they populate from the lab. If you choose not to use this program; the labs have to be reviewed, copied & mailed   causing a delay in getting the results to you.

## 2012-10-13 LAB — VITAMIN D 1,25 DIHYDROXY
Vitamin D 1, 25 (OH)2 Total: 44 pg/mL (ref 18–72)
Vitamin D2 1, 25 (OH)2: <8 pg/mL

## 2012-10-14 ENCOUNTER — Ambulatory Visit (INDEPENDENT_AMBULATORY_CARE_PROVIDER_SITE_OTHER): Payer: Medicare Other | Admitting: General Surgery

## 2012-10-14 ENCOUNTER — Encounter (INDEPENDENT_AMBULATORY_CARE_PROVIDER_SITE_OTHER): Payer: Self-pay | Admitting: General Surgery

## 2012-10-14 VITALS — BP 118/68 | HR 57 | Temp 98.9°F | Resp 16 | Ht 61.5 in | Wt 123.5 lb

## 2012-10-14 DIAGNOSIS — K648 Other hemorrhoids: Secondary | ICD-10-CM

## 2012-10-14 DIAGNOSIS — R159 Full incontinence of feces: Secondary | ICD-10-CM

## 2012-10-14 NOTE — Progress Notes (Signed)
Heather Ramsey is a 67 y.o. female who is here for a follow up visit regarding her fecal incontinence and hemorrhoids.  She is still having some bleeding and fecal leakage.  Her pain is minimal.    Objective: Filed Vitals:   10/14/12 1125  BP: 118/68  Pulse: 57  Temp: 98.9 F (37.2 C)  Resp: 16    General appearance: alert, cooperative and no distress GI: soft, non-tender; bowel sounds normal; no masses,  no organomegaly   Assessment and Plan: We discussed her manometry results.  She has a good internal pressure but a weak squeeze.  I think the [facility] procedure will be feasible.  She is borderline for being a candidate for any incontinence procedures, but if her problems get worse after surgery, we could try solesta injections and she may also be a candidate for SNS. We discussed the risk of incontinence after hemorrhoid surgery.  I think this risk is minimal in her and that her fecal leakage may be due to her hemorrhoids.  We discussed the general risk of this surgery which is mainly bleeding and rectal pain.  She would like to schedule in Feb.      .Vanita Panda, MD [facility], [place] [phone]

## 2012-10-14 NOTE — Patient Instructions (Signed)
We will schedule your surgery at your convenience  Continue high fiber diet and stool softeners

## 2012-12-03 ENCOUNTER — Ambulatory Visit: Admit: 2012-12-03 | Payer: Self-pay | Admitting: General Surgery

## 2012-12-03 SURGERY — TRANSANAL HEMORRHOIDAL DEARTERIALIZATION
Anesthesia: Choice

## 2012-12-19 ENCOUNTER — Encounter (INDEPENDENT_AMBULATORY_CARE_PROVIDER_SITE_OTHER): Payer: Medicare Other | Admitting: General Surgery

## 2013-03-20 ENCOUNTER — Other Ambulatory Visit: Payer: Self-pay | Admitting: Podiatry

## 2013-03-20 DIAGNOSIS — M7989 Other specified soft tissue disorders: Secondary | ICD-10-CM

## 2013-03-26 ENCOUNTER — Ambulatory Visit
Admission: RE | Admit: 2013-03-26 | Discharge: 2013-03-26 | Disposition: A | Discharge status: Home / Self Care | Payer: Medicare Other | Source: Ambulatory Visit | Attending: Podiatry | Admitting: Podiatry

## 2013-03-26 DIAGNOSIS — M7989 Other specified soft tissue disorders: Secondary | ICD-10-CM

## 2013-03-26 MED ORDER — GADOBENATE DIMEGLUMINE 529 MG/ML IV SOLN
10.0000 mL | Freq: Once | INTRAVENOUS | Status: AC | PRN
  Administered 2013-03-26: 10 mL via INTRAVENOUS

## 2013-04-02 ENCOUNTER — Encounter: Payer: Self-pay | Admitting: Rheumatology

## 2013-04-29 ENCOUNTER — Other Ambulatory Visit: Payer: Self-pay

## 2013-07-30 ENCOUNTER — Other Ambulatory Visit: Payer: Self-pay

## 2013-10-20 ENCOUNTER — Telehealth: Payer: Self-pay

## 2013-10-20 NOTE — Telephone Encounter (Addendum)
Medication and allergies:  Reviewed and updated  90 day supply/mail order: na Local pharmacy: [facility] [place]   Immunizations due:  PNA  A/P:   No changes to FH, PSH or Personal Hx Flu vaccine--06/2013 Tdap--12/2007 PNA--first vaccine 2009 Shingles--2010 Pap--12/2010--neg MMG--06/2012--neg Bone Density--06/2011--low bone mass CCS--06/2009--Dr Brodie--q 5 years  To Discuss with Provider: PNA vaccine

## 2013-10-21 ENCOUNTER — Encounter: Payer: Self-pay | Admitting: Internal Medicine

## 2013-10-21 ENCOUNTER — Ambulatory Visit (INDEPENDENT_AMBULATORY_CARE_PROVIDER_SITE_OTHER): Payer: Medicare Other | Admitting: Internal Medicine

## 2013-10-21 VITALS — BP 124/74 | HR 57 | Temp 98.0°F | Ht 61.25 in | Wt 128.2 lb

## 2013-10-21 DIAGNOSIS — K5730 Diverticulosis of large intestine without perforation or abscess without bleeding: Secondary | ICD-10-CM

## 2013-10-21 DIAGNOSIS — Z0000 Encounter for general adult medical examination without abnormal findings: Secondary | ICD-10-CM

## 2013-10-21 DIAGNOSIS — M899 Disorder of bone, unspecified: Secondary | ICD-10-CM

## 2013-10-21 DIAGNOSIS — Z23 Encounter for immunization: Secondary | ICD-10-CM

## 2013-10-21 DIAGNOSIS — E785 Hyperlipidemia, unspecified: Secondary | ICD-10-CM

## 2013-10-21 DIAGNOSIS — M949 Disorder of cartilage, unspecified: Secondary | ICD-10-CM

## 2013-10-21 DIAGNOSIS — M1220 Villonodular synovitis (pigmented), unspecified site: Secondary | ICD-10-CM | POA: Insufficient documentation

## 2013-10-21 LAB — CBC WITH DIFFERENTIAL/PLATELET
Basophils Absolute: 0 10*3/uL (ref 0.0–0.1)
Basophils Relative: 0.5 % (ref 0.0–3.0)
EOS PCT: 1 % (ref 0.0–5.0)
Eosinophils Absolute: 0 10*3/uL (ref 0.0–0.7)
HEMATOCRIT: 40.3 % (ref 36.0–46.0)
Hemoglobin: 13.5 g/dL (ref 12.0–15.0)
LYMPHS PCT: 26.7 % (ref 12.0–46.0)
Lymphs Abs: 1.2 10*3/uL (ref 0.7–4.0)
MCHC: 33.4 g/dL (ref 30.0–36.0)
MCV: 92.8 fl (ref 78.0–100.0)
MONOS PCT: 5.3 % (ref 3.0–12.0)
Monocytes Absolute: 0.2 10*3/uL (ref 0.1–1.0)
NEUTROS PCT: 66.5 % (ref 43.0–77.0)
Neutro Abs: 3 10*3/uL (ref 1.4–7.7)
PLATELETS: 228 10*3/uL (ref 150.0–400.0)
RBC: 4.34 Mil/uL (ref 3.87–5.11)
RDW: 13.8 % (ref 11.5–14.6)
WBC: 4.6 10*3/uL (ref 4.5–10.5)

## 2013-10-21 LAB — HEPATIC FUNCTION PANEL
ALT: 24 U/L (ref 0–35)
AST: 22 U/L (ref 0–37)
Albumin: 4.3 g/dL (ref 3.5–5.2)
Alkaline Phosphatase: 45 U/L (ref 39–117)
BILIRUBIN DIRECT: 0 mg/dL (ref 0.0–0.3)
BILIRUBIN TOTAL: 0.8 mg/dL (ref 0.3–1.2)
Total Protein: 7 g/dL (ref 6.0–8.3)

## 2013-10-21 LAB — BASIC METABOLIC PANEL
BUN: 14 mg/dL (ref 6–23)
CALCIUM: 9.6 mg/dL (ref 8.4–10.5)
CO2: 28 mEq/L (ref 19–32)
CREATININE: 0.9 mg/dL (ref 0.4–1.2)
Chloride: 102 mEq/L (ref 96–112)
GFR: 65.53 mL/min (ref >60.00)
Glucose, Bld: 79 mg/dL (ref 70–99)
POTASSIUM: 3.9 meq/L (ref 3.5–5.1)
Sodium: 136 mEq/L (ref 135–145)

## 2013-10-21 LAB — LDL CHOLESTEROL, DIRECT: Direct LDL: 186.4 mg/dL

## 2013-10-21 LAB — LIPID PANEL
CHOL/HDL RATIO: 3
Cholesterol: 302 mg/dL — ABNORMAL HIGH (ref 0–200)
HDL: 93 mg/dL (ref >39.00)
Triglycerides: 72 mg/dL (ref 0.0–149.0)
VLDL: 14.4 mg/dL (ref 0.0–40.0)

## 2013-10-21 LAB — TSH: TSH: 1.39 u[IU]/mL (ref 0.35–5.50)

## 2013-10-21 NOTE — Patient Instructions (Signed)
Your next office appointment will be determined based upon review of your pending labs &/ or x-rays. Those instructions will be transmitted to you through My Chart  . 

## 2013-10-21 NOTE — Progress Notes (Signed)
Pre visit review using our clinic review tool, if applicable. No additional management support is needed unless otherwise documented below in the visit note. 

## 2013-10-21 NOTE — Progress Notes (Signed)
Subjective:    Patient ID: Heather Ramsey, female    DOB: 08/06/1946, 68 y.o.   MRN: [id number]  HPI Medicare Wellness Visit: Psychosocial and medical history were reviewed as required by Medicare (history related to abuse, antisocial behavior , firearm risk). Yes, negative findings Social history: Caffeine:  , Alcohol:  , Tobacco use: Exercise: walk 2 miles/2 days per week; stretching exercises daily Personal safety/fall risk: Limitations of activities of daily living: L foot surgery in August. L foot edema daily since surgery Seatbelt/ smoke alarm use: yes Healthcare Power of Attorney/Living Will status: yes  Ophthalmologic exam status: 1 year ago Hearing evaluation status: At [employer] 2 years ago Orientation: Oriented x 4 Memory and recall: 3-word recall Spelling or math testing: spelling test passed Depression/anxiety assessment: Foreign travel history: [place] 3 years ago Immunization status for influenza/pneumonia/ shingles /tetanus: Pneumonia vaccine today Transfusion history: none Preventive health care maintenance status: Colonoscopy/BMD/mammogram/Pap as per protocol/standard care: Colonoscopy due now (last 2009) ; she'll schedule Dental care: q 6 months; May 2015 next visit  Chart reviewed and updated. Active issues reviewed and addressed as documented below.    Review of Systems  She denies dyspepsia, dysphasia, unexplained weight loss, abdominal pain, melena, rectal bleeding, or small caliber stools.      Objective:   Physical Exam Gen.: Healthy and well-nourished in appearance. Alert, appropriate and cooperative throughout exam. Appears younger than stated age  Head: Normocephalic without obvious abnormalities Eyes: No corneal or conjunctival inflammation noted. Pupils equal round reactive to light and accommodation. Extraocular motion intact. Fundal exam is benign without hemorrhages, exudate, papilledema.  Vision grossly normal with lenses Ears: External  ear  exam reveals no significant lesions or deformities. Canals clear .TMs normal. Hearing is grossly normal bilaterally. Nose: External nasal exam reveals no deformity or inflammation. Nasal mucosa are pink and moist. No lesions or exudates noted.   Mouth: Oral mucosa and oropharynx reveal no lesions or exudates. Teeth in good repair. Neck: No deformities, masses, or tenderness noted. Range of motion & Thyroid normal. Lungs: Normal respiratory effort; chest expands symmetrically. Lungs are clear to auscultation without rales, wheezes, or increased work of breathing. Heart: Normal rate and rhythm. Normal S1 and S2. No gallop, click, or rub. Grade 1/2 over 6 systolic murmur. Abdomen: Bowel sounds normal; abdomen soft and nontender. No masses, organomegaly or hernias noted.Aorta palpable with faint bruit  ; no AAA Genitalia:  as per Gyn                                  Musculoskeletal/extremities: No deformity or scoliosis noted of  the thoracic or lumbar spine.    No clubbing, cyanosis, edema, or significant extremity  deformity noted. Range of motion normal .Tone & strength normal. Hand joints  reveal minor DJD DIP changes. Fingernail health good. Able to lie down & sit up w/o help. Negative SLR bilaterally Vascular: Carotid, radial artery, dorsalis pedis and  posterior tibial pulses are full and equal. No bruits present. Neurologic: Alert and oriented x3. Deep tendon reflexes symmetrical and normal.        Skin: Intact without suspicious lesions or rashes. Lymph: No cervical, axillary lymphadenopathy present. Psych: Mood and affect are normal. Normally interactive  Assessment & Plan:  #1 Medicare Wellness Exam; criteria met ; data entered #2 Problem List/Diagnoses reviewed Plan:  Assessments made/ Orders entered  

## 2013-10-24 LAB — VITAMIN D 1,25 DIHYDROXY
VITAMIN D3 1, 25 (OH): 52 pg/mL
Vitamin D 1, 25 (OH)2 Total: 52 pg/mL (ref 18–72)

## 2013-10-26 ENCOUNTER — Telehealth: Payer: Self-pay | Admitting: *Deleted

## 2013-10-26 NOTE — Telephone Encounter (Signed)
Notes on lab results in EMR; copy can be mailed to her

## 2013-10-26 NOTE — Telephone Encounter (Signed)
Patient phoned requesting results/explanation from last OV with PCP (10/21/13).  States they were, but no longer are available for her viewing in [place].  Also, states there was a book on high cholesterol that was recommended.  Requests that either previous info be made available again for her on mychart or either someone phone back with info.  Please advise.   CB# [phone]

## 2013-10-26 NOTE — Telephone Encounter (Signed)
Mailed the pt her recent lab results and note.//AB/CMA

## 2014-01-20 DIAGNOSIS — Z0289 Encounter for other administrative examinations: Secondary | ICD-10-CM

## 2014-03-25 ENCOUNTER — Encounter: Payer: Self-pay | Admitting: *Deleted

## 2014-05-21 ENCOUNTER — Encounter: Payer: Self-pay | Admitting: Internal Medicine

## 2014-06-04 ENCOUNTER — Encounter: Payer: Self-pay | Admitting: Internal Medicine

## 2014-07-09 ENCOUNTER — Other Ambulatory Visit: Payer: Self-pay

## 2014-07-22 ENCOUNTER — Ambulatory Visit (AMBULATORY_SURGERY_CENTER): Payer: Self-pay | Admitting: *Deleted

## 2014-07-22 VITALS — Ht 61.5 in | Wt 131.8 lb

## 2014-07-22 DIAGNOSIS — Z800 Family history of malignant neoplasm of digestive organs: Secondary | ICD-10-CM

## 2014-07-22 MED ORDER — MOVIPREP 100 G PO SOLR: ORAL | Status: DC

## 2014-07-22 NOTE — Progress Notes (Signed)
No allergies to eggs or soy. No problems with anesthesia.  Pt given Emmi instructions for colonoscopy  No oxygen use  No diet drug use  Pt says she had problems with getting cleaned out with last procedure because of chronic constipation.  Pt given instructions for 2 day prep with Miralax and MoviPrep

## 2014-07-25 HISTORY — PX: COLONOSCOPY: SHX174

## 2014-07-29 ENCOUNTER — Encounter: Payer: Self-pay | Admitting: Internal Medicine

## 2014-08-04 ENCOUNTER — Encounter: Payer: Self-pay | Admitting: Internal Medicine

## 2014-08-04 ENCOUNTER — Ambulatory Visit (AMBULATORY_SURGERY_CENTER): Payer: Medicare Other | Admitting: Internal Medicine

## 2014-08-04 VITALS — BP 129/73 | HR 53 | Temp 97.1°F | Resp 20 | Ht 61.0 in | Wt 131.0 lb

## 2014-08-04 DIAGNOSIS — Z1211 Encounter for screening for malignant neoplasm of colon: Secondary | ICD-10-CM

## 2014-08-04 DIAGNOSIS — Z800 Family history of malignant neoplasm of digestive organs: Secondary | ICD-10-CM

## 2014-08-04 MED ORDER — SODIUM CHLORIDE 0.9 % IV SOLN: 500.0000 mL | INTRAVENOUS | Status: DC

## 2014-08-04 NOTE — Progress Notes (Signed)
A/ox3, pleased with MAC, report to RN 

## 2014-08-04 NOTE — Op Note (Signed)
[place]  [address] [place], [address]   COLONOSCOPY PROCEDURE REPORT  PATIENT: Heather Ramsey, Heather Ramsey  MR#: [id number] BIRTHDATE: 05/12/1946 , 80  yrs. old GENDER: female ENDOSCOPIST: Lafayette Dragon, MD REFERRED KG:YJEHUDJ Linna Darner, M.D. PROCEDURE DATE:  08/04/2014 PROCEDURE:   Colonoscopy, screening First Screening Colonoscopy - Avg.  risk and is 50 yrs.  old or older - No.  Prior Negative Screening - Now for repeat screening. Less than 10 yrs Prior Negative Screening - Now for repeat screening.  Above average risk  History of Adenoma - Now for follow-up colonoscopy & has been > or = to 3 yrs.  N/A  Polyps Removed Today? No.  Polyps Removed Today? No.  Recommend repeat exam, <10 yrs? Polyps Removed Today? No.  Recommend repeat exam, <10 yrs? Yes.  Polyps Removed Today? No.  Recommend repeat exam, <10 yrs? Yes.  High risk (family or personal hx). ASA CLASS:   Class II INDICATIONS:ffather had colon cancer.  Prior colonoscopy 2004 and in October 2010. MEDICATIONS: Monitored anesthesia care and Propofol 200 mg IV  DESCRIPTION OF PROCEDURE:   After the risks benefits and alternatives of the procedure were thoroughly explained, informed consent was obtained.  The digital rectal exam revealed no abnormalities of the rectum.   The LB [id number]-[id number]  endoscope was introduced through the anus and advanced to the cecum, which was identified by both the appendix and ileocecal valve. No adverse events experienced.   The quality of the prep was good, using MoviPrep  The instrument was then slowly withdrawn as the colon was fully examined.      COLON FINDINGS: A normal appearing cecum, ileocecal valve, and appendiceal orifice were identified.  The ascending, transverse, descending, sigmoid colon, and rectum appeared unremarkable. Retroflexed views revealed no abnormalities. The time to cecum=6 minutes 37 seconds.  Withdrawal time=6 minutes 51 seconds.  The scope  was withdrawn and the procedure completed. COMPLICATIONS: There were no immediate complications.  ENDOSCOPIC IMPRESSION: Normal colonoscopy  RECOMMENDATIONS: High fiber diet Recall colonoscopy in 5 years  eSigned:  Lafayette Dragon, MD 08/04/2014 9:41 AM   cc:

## 2014-08-04 NOTE — Patient Instructions (Signed)

## 2014-08-04 NOTE — Progress Notes (Signed)
No problems noted in the recovery room. maw 

## 2014-08-05 ENCOUNTER — Telehealth: Payer: Self-pay

## 2014-08-05 NOTE — Telephone Encounter (Signed)
  Follow up Call-  Call back number 08/04/2014  Post procedure Call Back phone  # [phone]  Permission to leave phone message Yes     Patient questions:  Do you have a fever, pain , or abdominal swelling? No. Pain Score  0 *  Have you tolerated food without any problems? Yes.    Have you been able to return to your normal activities? Yes.    Do you have any questions about your discharge instructions: Diet   No. Medications  No. Follow up visit  No.  Do you have questions or concerns about your Care? No.  Actions: * If pain score is 4 or above: No action needed, pain <4.   No problems per the pt. maw

## 2014-10-27 ENCOUNTER — Other Ambulatory Visit (INDEPENDENT_AMBULATORY_CARE_PROVIDER_SITE_OTHER): Payer: Medicare Other

## 2014-10-27 ENCOUNTER — Other Ambulatory Visit: Payer: Self-pay | Admitting: Internal Medicine

## 2014-10-27 ENCOUNTER — Ambulatory Visit (INDEPENDENT_AMBULATORY_CARE_PROVIDER_SITE_OTHER): Payer: Medicare Other | Admitting: Internal Medicine

## 2014-10-27 ENCOUNTER — Encounter: Payer: Self-pay | Admitting: Internal Medicine

## 2014-10-27 VITALS — BP 124/88 | HR 59 | Temp 98.4°F | Ht 61.0 in | Wt 127.2 lb

## 2014-10-27 DIAGNOSIS — E785 Hyperlipidemia, unspecified: Secondary | ICD-10-CM

## 2014-10-27 DIAGNOSIS — G9332 Myalgic encephalomyelitis/chronic fatigue syndrome: Secondary | ICD-10-CM

## 2014-10-27 DIAGNOSIS — R5382 Chronic fatigue, unspecified: Secondary | ICD-10-CM

## 2014-10-27 DIAGNOSIS — IMO0001 Reserved for inherently not codable concepts without codable children: Secondary | ICD-10-CM

## 2014-10-27 DIAGNOSIS — M791 Myalgia: Secondary | ICD-10-CM

## 2014-10-27 DIAGNOSIS — M609 Myositis, unspecified: Secondary | ICD-10-CM

## 2014-10-27 LAB — TSH: TSH: 2.29 u[IU]/mL (ref 0.35–4.50)

## 2014-10-27 NOTE — Progress Notes (Signed)
Pre visit review using our clinic review tool, if applicable. No additional management support is needed unless otherwise documented below in the visit note. 

## 2014-10-27 NOTE — Assessment & Plan Note (Signed)
NMR Lipoprofile, TSH

## 2014-10-27 NOTE — Progress Notes (Signed)
Subjective:    Patient ID: Heather Ramsey, female    DOB: 10-07-1945, 69 y.o.   MRN: [id number]  HPI The patient is here to assess status of active health conditions.  PMH, FH, & Social History reviewed & updated.    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  Advanced cholesterol testing reveals that her LDL goal is less than 160, ideally less than 130.  There is a family history of premature stroke in her mother & premature heart attack in her father.  Colonoscopy is up-to-date; there is a history of colon cancer in her father. She has no active GI symptoms.  She is followed by the rheumatologist for chronic fatigue/myalgias. Those labs are current.  Additionally her father had an abnormal chest x-ray but no clinical tuberculosis. Paternal Grandmother had tuberculosis.    Review of Systems   Chest pain, palpitations, tachycardia, exertional dyspnea, paroxysmal nocturnal dyspnea, claudication or edema are absent.   She denies constitutional symptoms , productive cough or hemoptysis.  Unexplained weight loss, abdominal pain, significant dyspepsia, dysphagia, melena, rectal bleeding, or persistently small caliber stools are denied.     Objective:   Physical Exam  Gen.: Adequately nourished in appearance. Alert, appropriate and cooperative throughout exam. Appears younger than stated age  Head: Normocephalic without obvious abnormalities  Eyes: No corneal or conjunctival inflammation noted. Pupils equal round reactive to light and accommodation. Extraocular motion intact.  Ears: External  ear exam reveals no significant lesions or deformities. Canals clear .TMs normal. Hearing is grossly normal bilaterally. Nose: External nasal exam reveals no deformity or inflammation. Nasal mucosa are pink and moist. No lesions or exudates noted.   Mouth: Oral mucosa and oropharynx reveal no lesions or exudates.  Teeth in good repair. Neck: No deformities, masses, or tenderness noted. Range of motion &. Thyroid normal. Lungs: Normal respiratory effort; chest expands symmetrically. Lungs are clear to auscultation without rales, wheezes, or increased work of breathing. Heart: Normal rate and rhythm. Normal S1 and S2. No gallop, click, or rub. No murmur. Abdomen: Bowel sounds normal; abdomen soft and nontender. No masses, organomegaly or hernias noted.Aorta palpable ; no AAA Genitalia: as per Gyn                                  Musculoskeletal/extremities: Minimally accentuated curvature of upper thoracic spine. No clubbing, cyanosis, edema, or significant extremity  deformity noted.  Range of motion normal . Tone & strength normal. Hand joints normal  Fingernail  health good. Crepitus of knees  Able to lie down & sit up w/o help.  Negative SLR bilaterally Vascular: Carotid, radial artery, dorsalis pedis and  posterior tibial pulses are full and equal. No bruits present. Neurologic: Alert and oriented x3. Deep tendon reflexes symmetrical and normal.  Gait normal      Skin: Intact without suspicious lesions or rashes. Lymph: No cervical, axillary lymphadenopathy present. Psych: Mood and affect are normal. Normally interactive  Assessment & Plan:  See Current Assessment & Plan in Problem List under specific Diagnosis

## 2014-10-27 NOTE — Patient Instructions (Signed)
Your next office appointment will be determined based upon review of your pending labs . Those instructions will be transmitted to you through My Chart  O Critical values will be called

## 2014-10-29 LAB — NMR LIPOPROFILE WITH LIPIDS
Cholesterol, Total: 232 mg/dL — ABNORMAL HIGH (ref 100–199)
HDL PARTICLE NUMBER: 36 umol/L (ref >=30.5)
HDL Size: 10.2 nm (ref >=9.2)
HDL-C: 85 mg/dL (ref >39)
LARGE VLDL-P: 1.1 nmol/L (ref <=2.7)
LDL (calc): 136 mg/dL — ABNORMAL HIGH (ref 0–99)
LDL Particle Number: 1302 nmol/L — ABNORMAL HIGH (ref <1000)
LDL SIZE: 21.6 nm (ref >=20.8)
Large HDL-P: 15.3 umol/L (ref >=4.8)
TRIGLYCERIDES: 56 mg/dL (ref 0–149)
VLDL SIZE: 44.7 nm (ref <=46.6)

## 2015-03-16 ENCOUNTER — Telehealth: Payer: Self-pay | Admitting: *Deleted

## 2015-03-16 NOTE — Telephone Encounter (Signed)
Patient's last mammo was 07/06/14. Done at [facility]

## 2015-03-16 NOTE — Telephone Encounter (Signed)
Updated health maint record with mammogram info

## 2015-03-16 NOTE — Telephone Encounter (Signed)
Called pt concerning recent mammo. No answer LMOM RTC...Heather Ramsey

## 2015-03-21 ENCOUNTER — Other Ambulatory Visit: Payer: Self-pay

## 2015-06-13 ENCOUNTER — Encounter: Payer: Self-pay | Admitting: Internal Medicine

## 2015-06-13 ENCOUNTER — Ambulatory Visit (INDEPENDENT_AMBULATORY_CARE_PROVIDER_SITE_OTHER): Payer: Medicare Other | Admitting: Internal Medicine

## 2015-06-13 VITALS — BP 134/86 | HR 65 | Temp 98.0°F | Resp 16 | Wt 120.0 lb

## 2015-06-13 DIAGNOSIS — J0111 Acute recurrent frontal sinusitis: Secondary | ICD-10-CM

## 2015-06-13 NOTE — Patient Instructions (Signed)

## 2015-06-13 NOTE — Progress Notes (Signed)
   Subjective:    Patient ID: Heather Ramsey, female    DOB: [birth year], 69 y.o.   MRN: [id number]  HPI  Her symptoms began as a scratchy throat 9/4; this was followed by  a frank sore throat as of 9/5. On 9/6 she had nausea and vomiting 8 times. She learned her grandson to whom she was exposed had a stomach virus.  Subsequently she's developed right frontal and right maxillary discomfort with copious rhinitis and associated cough.  On 9/16 she went to the urgent care & was prescribed Cefdinir. She states that on 9/17 she developed facial swelling and diffuse rash of the extremities and thorax. She stopped the oral antibiotic. She contacted urgent care and was instructed to  take another dose today. There was no rash or swelling noted with fat.  She also been taking Mucinex DM.  Review of Systems The right frontal and maxillary sinus pain has essentially resolved. There's been no frank purulence of the nose. She denies fever, chills, or sweats.    Objective:   Physical Exam  She has hyponasal speech pattern. There is severe erythema of the nasal mucosa, especially on the left.  General appearance:Adequately nourished; no acute distress or increased work of breathing is present.    Lymphatic: No  lymphadenopathy about the head, neck, or axilla .  Eyes: No conjunctival inflammation or lid edema is present. There is no scleral icterus.  Ears:  External ear exam shows no significant lesions or deformities.  Otoscopic examination reveals clear canals, tympanic membranes are intact bilaterally without bulging, retraction, inflammation or discharge.  Nose:  External nasal examination shows no deformity or inflammation. No septal dislocation or deviation.No obstruction to airflow.   Oral exam: Dental hygiene is good; lips and gums are healthy appearing.There is no oropharyngeal erythema or exudate .  Neck:  No deformities, thyromegaly, masses, or tenderness noted.   Supple with full range  of motion without pain.   Heart:  Normal rate and regular rhythm. S1 and S2 normal without gallop, murmur, click, rub or other extra sounds.   Lungs:Chest clear to auscultation; no wheezes, rhonchi,rales ,or rubs present.  Extremities:  No cyanosis, edema, or clubbing  noted    Skin: Warm & dry w/o tenting or jaundice. No significant lesions or rash.        Assessment & Plan:  #1 right frontal and maxillary sinusitis  Plan: Nasal hygiene was discussed. She'll attempt to finish the oral antibiotic. If she develops a rash or fever this will be switched to doxycycline.

## 2015-06-13 NOTE — Progress Notes (Signed)
Pre visit review using our clinic review tool, if applicable. No additional management support is needed unless otherwise documented below in the visit note. 

## 2015-07-06 ENCOUNTER — Encounter: Payer: Self-pay | Admitting: Internal Medicine

## 2015-07-28 LAB — HM MAMMOGRAPHY

## 2015-08-04 ENCOUNTER — Encounter: Payer: Self-pay | Admitting: Internal Medicine

## 2015-09-29 DIAGNOSIS — L603 Nail dystrophy: Secondary | ICD-10-CM | POA: Diagnosis not present

## 2015-09-29 DIAGNOSIS — B351 Tinea unguium: Secondary | ICD-10-CM | POA: Diagnosis not present

## 2015-10-07 DIAGNOSIS — B351 Tinea unguium: Secondary | ICD-10-CM | POA: Diagnosis not present

## 2015-10-07 DIAGNOSIS — Z79899 Other long term (current) drug therapy: Secondary | ICD-10-CM | POA: Diagnosis not present

## 2015-10-13 ENCOUNTER — Encounter: Payer: Self-pay | Admitting: Internal Medicine

## 2015-10-13 DIAGNOSIS — B351 Tinea unguium: Secondary | ICD-10-CM | POA: Insufficient documentation

## 2015-11-15 ENCOUNTER — Ambulatory Visit (INDEPENDENT_AMBULATORY_CARE_PROVIDER_SITE_OTHER): Payer: PPO | Admitting: Internal Medicine

## 2015-11-15 ENCOUNTER — Other Ambulatory Visit (INDEPENDENT_AMBULATORY_CARE_PROVIDER_SITE_OTHER): Payer: PPO

## 2015-11-15 ENCOUNTER — Encounter: Payer: Self-pay | Admitting: Internal Medicine

## 2015-11-15 VITALS — BP 130/90 | HR 64 | Temp 98.1°F | Resp 16 | Wt 122.0 lb

## 2015-11-15 DIAGNOSIS — E785 Hyperlipidemia, unspecified: Secondary | ICD-10-CM

## 2015-11-15 DIAGNOSIS — M1220 Villonodular synovitis (pigmented), unspecified site: Secondary | ICD-10-CM | POA: Diagnosis not present

## 2015-11-15 DIAGNOSIS — M8580 Other specified disorders of bone density and structure, unspecified site: Secondary | ICD-10-CM

## 2015-11-15 DIAGNOSIS — Z0000 Encounter for general adult medical examination without abnormal findings: Secondary | ICD-10-CM | POA: Diagnosis not present

## 2015-11-15 DIAGNOSIS — Z23 Encounter for immunization: Secondary | ICD-10-CM

## 2015-11-15 LAB — LIPID PANEL
CHOLESTEROL: 226 mg/dL — AB (ref 0–200)
HDL: 74.5 mg/dL (ref >39.00)
LDL CALC: 142 mg/dL — AB (ref 0–99)
NonHDL: 151.59
TRIGLYCERIDES: 48 mg/dL (ref 0.0–149.0)
Total CHOL/HDL Ratio: 3
VLDL: 9.6 mg/dL (ref 0.0–40.0)

## 2015-11-15 LAB — COMPREHENSIVE METABOLIC PANEL
ALBUMIN: 4.5 g/dL (ref 3.5–5.2)
ALT: 13 U/L (ref 0–35)
AST: 17 U/L (ref 0–37)
Alkaline Phosphatase: 46 U/L (ref 39–117)
BILIRUBIN TOTAL: 0.5 mg/dL (ref 0.2–1.2)
BUN: 15 mg/dL (ref 6–23)
CALCIUM: 9.5 mg/dL (ref 8.4–10.5)
CHLORIDE: 101 meq/L (ref 96–112)
CO2: 27 mEq/L (ref 19–32)
CREATININE: 0.7 mg/dL (ref 0.40–1.20)
GFR: 88.15 mL/min (ref >60.00)
Glucose, Bld: 89 mg/dL (ref 70–99)
Potassium: 4.3 mEq/L (ref 3.5–5.1)
SODIUM: 134 meq/L — AB (ref 135–145)
TOTAL PROTEIN: 6.8 g/dL (ref 6.0–8.3)

## 2015-11-15 LAB — CBC WITH DIFFERENTIAL/PLATELET
BASOS ABS: 0 10*3/uL (ref 0.0–0.1)
BASOS PCT: 0.7 % (ref 0.0–3.0)
EOS ABS: 0 10*3/uL (ref 0.0–0.7)
Eosinophils Relative: 1.1 % (ref 0.0–5.0)
HCT: 39.8 % (ref 36.0–46.0)
HEMOGLOBIN: 13.6 g/dL (ref 12.0–15.0)
LYMPHS PCT: 20.8 % (ref 12.0–46.0)
Lymphs Abs: 0.8 10*3/uL (ref 0.7–4.0)
MCHC: 34.1 g/dL (ref 30.0–36.0)
MCV: 89.7 fl (ref 78.0–100.0)
MONO ABS: 0.3 10*3/uL (ref 0.1–1.0)
Monocytes Relative: 7.1 % (ref 3.0–12.0)
Neutro Abs: 2.9 10*3/uL (ref 1.4–7.7)
Neutrophils Relative %: 70.3 % (ref 43.0–77.0)
PLATELETS: 231 10*3/uL (ref 150.0–400.0)
RBC: 4.44 Mil/uL (ref 3.87–5.11)
RDW: 12.8 % (ref 11.5–15.5)
WBC: 4.1 10*3/uL (ref 4.0–10.5)

## 2015-11-15 LAB — T4, FREE: FREE T4: 0.94 ng/dL (ref 0.60–1.60)

## 2015-11-15 LAB — T3, FREE: T3 FREE: 2.6 pg/mL (ref 2.3–4.2)

## 2015-11-15 LAB — TSH: TSH: 3.17 u[IU]/mL (ref 0.35–4.50)

## 2015-11-15 MED ORDER — TERBINAFINE HCL 250 MG PO TABS: 250.0000 mg | ORAL_TABLET | Freq: Every day | ORAL | Status: AC

## 2015-11-15 NOTE — Addendum Note (Signed)
Addended by: Terence Lux B on: 11/15/2015 10:55 AM   Modules accepted: Orders

## 2015-11-15 NOTE — Progress Notes (Signed)
Pre visit review using our clinic review tool, if applicable. No additional management support is needed unless otherwise documented below in the visit note. 

## 2015-11-15 NOTE — Progress Notes (Signed)
Subjective:    Patient ID: Heather Ramsey, female    DOB: [birth year], 70 y.o.   MRN: [id number]  HPI She is here for a physical exam.   Her eye doctor thinks she may have a low thyroid because of dry eyes and loss of eye lashes.  Her skin is very dry and she has had some hair loss.  Her hair loss has gotten better.  Her tsh has been normal.  Her sister and mother have low thyroid.     Medications and allergies reviewed with patient and updated if appropriate.  Patient Active Problem List   Diagnosis Date Noted  . Onychomycosis 10/13/2015  . 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 Abnormal EKG 02/05/2012  . Atrophic vaginitis   . Basal cell cancer 04/24/2011  . DIVERTICULOSIS, COLON 06/09/2009  . Hyperlipidemia 05/31/2008  . IRRITABLE BOWEL SYNDROME 05/27/2008  . Myalgia and myositis 05/27/2008  . OSTEOPENIA 05/27/2008  . Chronic fatigue syndrome 05/27/2008    Current Outpatient Prescriptions on File Prior to Visit  Medication Sig Dispense Refill  . b complex vitamins tablet Take 1 tablet by mouth daily.      . Calcium Carbonate-Vitamin D (CALCIUM + D PO) Take by mouth daily.    . Coenzyme Q10 (COQ10) 100 MG CAPS Take by mouth 3 (three) times daily.      . fish oil-omega-3 fatty acids 1000 MG capsule Take 2 g by mouth daily.      . Ginger, Zingiber officinalis, (GINGER ROOT) 550 MG CAPS Take by mouth daily.      Marland Kitchen lidocaine (LIDODERM) 5 % Place 1 patch onto the skin as needed. Remove & Discard patch within 12 hours or as directed by MD    . magnesium 30 MG tablet Take 30 mg by mouth 2 (two) times daily.      . nitrofurantoin (MACRODANTIN) 50 MG capsule Take 50 mg by mouth as needed.    . Psyllium (METAMUCIL) 28.3 % POWD Take 1 scoop dissolved in at least 8 ounces water/juice and drink twice daily 1 Bottle 0  . Red Yeast Rice Extract (RED YEAST RICE PO) Take by mouth.      . TURMERIC PO Take 400 mg by mouth daily.     No current facility-administered  medications on file prior to visit.    Past Medical History  Diagnosis Date  . IBS (irritable bowel syndrome)   . Fibromyalgia     Dr Estanislado Pandy  . Pulmonary nodule 07/2006  . Hyperlipidemia   . Osteopenia   . UTI (lower urinary tract infection) 2005    Citrobacter koseri, hospitalized w/ kidney infection 1983  . Chest pain     negative  cardiac evaluation; Dr Percival Spanish  . Diverticulosis of colon 2005& 2010    FH colon cancer  . Atrophic vaginitis   . Hemorrhoid   . MVP (mitral valve prolapse)   . Migraine   . Allergy   . Heart murmur   . GERD (gastroesophageal reflux disease)     Past Surgical History  Procedure Laterality Date  . Breast biopsy Left 2003  . Burch procedure  1995    with bladder tack   . Colonoscopy  07/2014    negative; Dr Olevia Perches  . Foot surgery Left 2014    Social History   Social History  . Marital Status: Married    Spouse Name: N/A  . Number of Children: 2  . Years of Education: N/A   Social  History Main Topics  . Smoking status: Never Smoker   . Smokeless tobacco: Never Used  . Alcohol Use: No  . Drug Use: No  . Sexual Activity: Yes    Birth Control/ Protection: Post-menopausal   Other Topics Concern  . None   Social History Narrative   Regular exercise- yes: walks/elliptical 3x/week x 30 mins.  Lives with husband.             Family History  Problem Relation Age of Onset  . Heart attack Father 43    Died with multiple medical problems  . Colon cancer Father 29  . Diabetes Father   . Tuberculosis Father     abnormal CXR w/o clinical disease  . Tuberculosis Paternal Grandmother   . Stroke Mother 64  . Hypothyroidism Mother   . Hypertension Mother   . Hypothyroidism Sister   . Hypertension Sister   . Breast cancer Sister   . Diabetes Sister   . Heart attack Sister     2 sisters > 72    Review of Systems  Constitutional: Negative for fever, chills, appetite change and fatigue.  HENT: Positive for hearing loss (?  related to excessive wax).   Eyes: Positive for visual disturbance (cataracts, dry eyes).  Respiratory: Negative for cough, shortness of breath and wheezing.   Cardiovascular: Negative for chest pain, palpitations and leg swelling.  Gastrointestinal: Positive for constipation. Negative for nausea, abdominal pain, diarrhea and blood in stool.       No GERD  Genitourinary: Negative for dysuria and hematuria.  Musculoskeletal: Positive for back pain (lower back on occasion) and arthralgias (knee pain - arthritis).  Skin: Negative for rash.  Neurological: Positive for dizziness (with quick movmeents on occasion). Negative for light-headedness and headaches.  Psychiatric/Behavioral: Negative for dysphoric mood. The patient is not nervous/anxious.        Objective:   Filed Vitals:   11/15/15 0810  BP: 130/90  Pulse: 64  Temp: 98.1 F (36.7 C)  Resp: 16   Filed Weights   11/15/15 0810  Weight: 122 lb (55.339 kg)   Body mass index is 23.06 kg/(m^2).   Physical Exam Constitutional: She appears well-developed and well-nourished. No distress.  HENT:  Head: Normocephalic and atraumatic.  Right Ear: External ear normal. Normal ear canal and TM Left Ear: External ear normal.  Minimal wax in ear canal, TM not visualized Mouth/Throat: Oropharynx is clear and moist.  Normal bilateral ear canals and tympanic membranes  Eyes: Conjunctivae and EOM are normal.  Neck: Neck supple. No tracheal deviation present. No thyromegaly present.  No carotid bruit  Cardiovascular: Normal rate, regular rhythm and normal heart sounds.   No murmur heard.  No edema. Pulmonary/Chest: Effort normal and breath sounds normal. No respiratory distress. She has no wheezes. She has no rales.  Breast: deferred to Gyn Abdominal: Soft. She exhibits no distension. There is no tenderness.  Lymphadenopathy: She has no cervical adenopathy.  Skin: Skin is warm and dry. She is not diaphoretic.  Psychiatric: She has a  normal mood and affect. Her behavior is normal.       Assessment & Plan:    Physical exam: Screening blood work ordered Immunizations - prevnar vaccine, rest up to date Colonoscopy - up to date Mammogram  Up to date Dexa  Up to date Eye exams  up to date EKG - last EKG reviewed, will not repeat today Exercise - regular, but not as much due to knee pain - advised considering  yoga Weight - good, normal BMI Skin  - no concerns Substance abuse  - no abuse  See Problem List for Assessment and Plan of chronic medical problems.  F/u annually

## 2015-11-15 NOTE — Assessment & Plan Note (Signed)
Aggressive tumor - no evidence of recurrence Follows at [facility] every 6 months

## 2015-11-15 NOTE — Assessment & Plan Note (Signed)
dexa up to date - ordered by gyn Continue exercise as tolerated Calcium, vitamin d

## 2015-11-15 NOTE — Assessment & Plan Note (Signed)
Did not tolerate lipitor Exercising, very healthy diet Would like to avoid medication

## 2015-11-15 NOTE — Patient Instructions (Signed)
  We have reviewed your prior records including labs and tests today.  Test(s) ordered today. Your results will be released to [place] (or called to you) after review, usually within 72hours after test completion. If any changes need to be made, you will be notified at that same time.  All other Health Maintenance issues reviewed.   All recommended immunizations and age-appropriate screenings are up-to-date.  prevnar vaccine administered today.   Medications reviewed and updated.  No changes recommended at this time.  Please followup annually

## 2015-11-22 LAB — CARDIO IQ (R) ST2, SOLUBLE: ST2,Soluble: 26 ng/mL (ref <=35)

## 2015-11-23 ENCOUNTER — Encounter: Payer: Self-pay | Admitting: Internal Medicine

## 2015-12-12 DIAGNOSIS — R5381 Other malaise: Secondary | ICD-10-CM | POA: Diagnosis not present

## 2015-12-12 DIAGNOSIS — M19241 Secondary osteoarthritis, right hand: Secondary | ICD-10-CM | POA: Diagnosis not present

## 2015-12-12 DIAGNOSIS — M797 Fibromyalgia: Secondary | ICD-10-CM | POA: Diagnosis not present

## 2015-12-12 DIAGNOSIS — M170 Bilateral primary osteoarthritis of knee: Secondary | ICD-10-CM | POA: Diagnosis not present

## 2016-05-01 DIAGNOSIS — M25472 Effusion, left ankle: Secondary | ICD-10-CM | POA: Diagnosis not present

## 2016-05-01 DIAGNOSIS — M1220 Villonodular synovitis (pigmented), unspecified site: Secondary | ICD-10-CM | POA: Diagnosis not present

## 2016-05-01 DIAGNOSIS — M659 Synovitis and tenosynovitis, unspecified: Secondary | ICD-10-CM | POA: Diagnosis not present

## 2016-05-01 DIAGNOSIS — M12272 Villonodular synovitis (pigmented), left ankle and foot: Secondary | ICD-10-CM | POA: Diagnosis not present

## 2016-05-31 DIAGNOSIS — H5203 Hypermetropia, bilateral: Secondary | ICD-10-CM | POA: Diagnosis not present

## 2016-05-31 DIAGNOSIS — H04123 Dry eye syndrome of bilateral lacrimal glands: Secondary | ICD-10-CM | POA: Diagnosis not present

## 2016-05-31 DIAGNOSIS — H52203 Unspecified astigmatism, bilateral: Secondary | ICD-10-CM | POA: Diagnosis not present

## 2016-05-31 DIAGNOSIS — H25813 Combined forms of age-related cataract, bilateral: Secondary | ICD-10-CM | POA: Diagnosis not present

## 2016-06-05 DIAGNOSIS — Z6824 Body mass index (BMI) 24.0-24.9, adult: Secondary | ICD-10-CM | POA: Diagnosis not present

## 2016-06-05 DIAGNOSIS — Z124 Encounter for screening for malignant neoplasm of cervix: Secondary | ICD-10-CM | POA: Diagnosis not present

## 2016-06-05 DIAGNOSIS — N390 Urinary tract infection, site not specified: Secondary | ICD-10-CM | POA: Diagnosis not present

## 2016-06-22 DIAGNOSIS — M533 Sacrococcygeal disorders, not elsewhere classified: Secondary | ICD-10-CM | POA: Diagnosis not present

## 2016-06-22 DIAGNOSIS — G4709 Other insomnia: Secondary | ICD-10-CM | POA: Diagnosis not present

## 2016-06-22 DIAGNOSIS — M797 Fibromyalgia: Secondary | ICD-10-CM | POA: Diagnosis not present

## 2016-06-22 DIAGNOSIS — M810 Age-related osteoporosis without current pathological fracture: Secondary | ICD-10-CM | POA: Diagnosis not present

## 2016-07-03 ENCOUNTER — Ambulatory Visit: Payer: PPO | Attending: Rheumatology | Admitting: Physical Therapy

## 2016-07-03 DIAGNOSIS — M6281 Muscle weakness (generalized): Secondary | ICD-10-CM | POA: Insufficient documentation

## 2016-07-03 DIAGNOSIS — M62830 Muscle spasm of back: Secondary | ICD-10-CM | POA: Diagnosis not present

## 2016-07-03 DIAGNOSIS — R262 Difficulty in walking, not elsewhere classified: Secondary | ICD-10-CM

## 2016-07-03 DIAGNOSIS — M25552 Pain in left hip: Secondary | ICD-10-CM | POA: Insufficient documentation

## 2016-07-03 DIAGNOSIS — M5442 Lumbago with sciatica, left side: Secondary | ICD-10-CM | POA: Insufficient documentation

## 2016-07-03 NOTE — Patient Instructions (Signed)
Trigger Point Dry Needling  . What is Trigger Point Dry Needling (DN)? o DN is a physical therapy technique used to treat muscle pain and dysfunction. Specifically, DN helps deactivate muscle trigger points (muscle knots).  o A thin filiform needle is used to penetrate the skin and stimulate the underlying trigger point. The goal is for a local twitch response (LTR) to occur and for the trigger point to relax. No medication of any kind is injected during the procedure.   . What Does Trigger Point Dry Needling Feel Like?  o The procedure feels different for each individual patient. Some patients report that they do not actually feel the needle enter the skin and overall the process is not painful. Very mild bleeding may occur. However, many patients feel a deep cramping in the muscle in which the needle was inserted. This is the local twitch response.   Marland Kitchen How Will I feel after the treatment? o Soreness is normal, and the onset of soreness may not occur for a few hours. Typically this soreness does not last longer than two days.  o Bruising is uncommon, however; ice can be used to decrease any possible bruising.  o In rare cases feeling tired or nauseous after the treatment is normal. In addition, your symptoms may get worse before they get better, this period will typically not last longer than 24 hours.   . What Can I do After My Treatment? o Increase your hydration by drinking more water for the next 24 hours. o You may place ice or heat on the areas treated that have become sore, however, do not use heat on inflamed or bruised areas. Heat often brings more relief post needling. o You can continue your regular activities, but vigorous activity is not recommended initially after the treatment for 24 hours. o DN is best combined with other physical therapy such as strengthening, stretching, and other therapies.   Piriformis (Supine)  Cross legs, right on top. Gently pull other knee toward chest  until stretch is felt in buttock/hip of top leg. Hold _30 to 60 ___ seconds. 2-3 times   2-3 times a day Repeat ____ times per set. Do ____ sets per session. Do ____ sessions per day.  Hip Stretch  Put right ankle over left knee. Let right knee fall downward, but keep ankle in place. Feel the stretch in hip. May push down gently with hand to feel stretch. Hold _30 - 60 seconds___ seconds while counting out loud. Repeat with other leg. Repeat _2-3___ times. Do 2-3____ sessions per day.  Heather Ramsey, PT Exercise Expert for the Aging Adult  07/03/16 10:42 AM Phone: [phone] Fax: [phone]

## 2016-07-03 NOTE — Therapy (Signed)
[place], [place], [address] Phone: [phone]   Fax:  [phone]  Physical Therapy Evaluation  Patient Details  Name: CAMBREIGH CRISAFI MRN: [id number] Date of Birth: 02-Dec-1945 Referring Provider: Bo Merino MD  Encounter Date: 07/03/2016      PT End of Session - 07/03/16 1047    Visit Number 1   Number of Visits 8   Date for PT Re-Evaluation 08/28/16   Authorization Type [employer]   PT Start Time 1015   PT Stop Time 1110   PT Time Calculation (min) 55 min   Activity Tolerance Patient tolerated treatment well   Behavior During Therapy [facility] for tasks assessed/performed      Past Medical History:  Diagnosis Date  . Allergy   . Atrophic vaginitis   . Chest pain    negative  cardiac evaluation; Dr Percival Spanish  . Diverticulosis of colon 2005& 2010   FH colon cancer  . Fibromyalgia    Dr Estanislado Pandy  . GERD (gastroesophageal reflux disease)   . Heart murmur   . Hemorrhoid   . Hyperlipidemia   . IBS (irritable bowel syndrome)   . Migraine   . MVP (mitral valve prolapse)   . Osteopenia   . Pulmonary nodule 07/2006  . UTI (lower urinary tract infection) 2005   Citrobacter koseri, hospitalized w/ kidney infection 1983    Past Surgical History:  Procedure Laterality Date  . BREAST BIOPSY Left 2003  . [place]   with bladder tack   . COLONOSCOPY  07/2014   negative; Dr Olevia Perches  . FOOT SURGERY Left 2014    There were no vitals filed for this visit.       Subjective Assessment - 07/03/16 1022    Subjective Two weeks ago I just started hurting when I was walking about 2 miles.   My right knee hurts and I need a knee replacement but I havent done it yet.  I keep thinking if I wait they will  get something better.  It hurts worse rolling out of bed in the morning   Pertinent History chronic fatigue syndrome, myalgias, hyperlipidimia , osteopenia   Limitations  Sitting  getting up from sitting and in the morning.   How long can you sit comfortably? 90 minutes   How long can you stand comfortably? unlimited    How long can you walk comfortably? walking more than 1/2 mile   Diagnostic tests none   Patient Stated Goals I want to be able to walk more, I compensate for    Currently in Pain? Yes   Pain Score 6    Pain Location Back   Pain Orientation Left   Pain Descriptors / Indicators Aching;Sharp;Dull   Pain Type Acute pain   Pain Onset 1 to 4 weeks ago  2 weeks   Pain Frequency Intermittent   Aggravating Factors  getting in and out bed, getting out of car, stairs, household chores like vacuuming   Multiple Pain Sites Yes   Pain Score 8   Pain Location Knee   Pain Orientation Right   Pain Descriptors / Indicators Aching;Other (Comment)  locking clicking then more pain   Pain Type Chronic pain   Pain Onset More than a month ago   Pain Frequency Intermittent   Aggravating Factors  going down stairs or squatting pivoting on knee            [facility] PT Assessment - 07/03/16 1023  Assessment   Medical Diagnosis low back pain on left, SIJ   Referring Provider Bo Merino MD   Onset Date/Surgical Date 06/18/16   Hand Dominance Right   Prior Therapy Amy Trenton Gammon PT for hip bursitis 10 years ago     Precautions   Precautions None     Restrictions   Weight Bearing Restrictions No     Balance Screen   Has the patient fallen in the past 6 months No   Has the patient had a decrease in activity level because of a fear of falling?  No     Home Ecologist residence   Living Arrangements Spouse/significant other   [place] to enter   Entrance Stairs-Number of Steps 7   Entrance Stairs-Rails Right   Home Layout Two level     Prior Function   Level of Independence Independent     Cognition   Overall Cognitive Status Within Functional Limits for tasks assessed     Observation/Other  Assessments   Focus on Therapeutic Outcomes (FOTO)  FOTO 53% limitation 47% predicted 28%     Posture/Postural Control   Posture/Postural Control Postural limitations   Postural Limitations Anterior pelvic tilt;Rounded Shoulders;Forward head   Posture Comments even pelvis     AROM   Lumbar Flexion 95  able to touch floor   Lumbar Extension 20   Lumbar - Right Side Bend 30   Lumbar - Left Side Bend 25   Lumbar - Right Rotation 60%   Lumbar - Left Rotation 90%     Strength   Overall Strength Deficits   Overall Strength Comments grossly 4+/5 in LE's and 4-/5 in left gluteals     Flexibility   Hamstrings left hamstring 62, right 70     Palpation   SI assessment  Pt with no positive SI tests   Palpation comment marked tenderness over left piriformis      Pelvic Dictraction   Findings Negative   Side  Left   Comment bil     Pelvic Compression   Findings Negative   Side Left   comment bil     Sacral thrust    Findings Unable to test   Side Left   Comments bil     Sacral Compression   Findings Negative   Side  Left   Comments bil                   [facility] Adult PT Treatment/Exercise - 07/03/16 1023      Ambulation/Gait   Assistive device None   Gait Pattern Antalgic   Ambulation Surface Level   Gait Comments slight antalgia on right due to OA of knee     Modalities   Modalities Moist Heat     Moist Heat Therapy   Number Minutes Moist Heat 15 Minutes   Moist Heat Location Hip  left     Manual Therapy   Manual Therapy Soft tissue mobilization   Soft tissue mobilization left piriformis          Trigger Point Dry Needling - 07/03/16 1046    Consent Given? Yes   Education Handout Provided Yes   Muscles Treated Lower Body Piriformis;Gluteus maximus   Gluteus Maximus Response Twitch response elicited;Palpable increased muscle length  left only   Piriformis Response Twitch response elicited;Palpable increased muscle length  left               PT Education - 07/03/16  1058    Education provided Yes   Education Details POC, Explanation of findings. Intial HEP and explanation of trigger point dry needling after care and precautions   Person(s) Educated Patient   Methods Explanation;Demonstration;Verbal cues;Handout   Comprehension Verbalized understanding;Returned demonstration;Need further instruction             PT Long Term Goals - 07/03/16 1048      PT LONG TERM GOAL #1   Title Patient will be independent with advanced HEP   Time 8   Period Weeks   Status New     PT LONG TERM GOAL #2   Title Pain will decrease to 1/10 or less with all functional activities   Time 8   Period Weeks   Status New     PT LONG TERM GOAL #3   Title "Pt will tolerate standing and walking for 2 hours without increased pain in order to return to PLOF and work   Time 8   Period Weeks   Status New     PT LONG TERM GOAL #4   Title Pt will be able to return to walking for 2 miles without exacerbating pain   Time 8   Period Weeks   Status New     PT LONG TERM GOAL #5   Title Pt will be able to ascend/descend steps without exacerbating pain in left hip   Time 8   Period Weeks   Status New     Additional Long Term Goals   Additional Long Term Goals Yes     PT LONG TERM GOAL #6   Title "FOTO will improve from 47% limitation   to 28% limitation    indicating improved functional mobility.        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 Pt has negative results with SIJ testing at this evaluation and does demostrate marked tenderness on left piriformis tenderness without radiating pain.  Pt presents with mod complexity eval and defictis in pain,  AROM, strength, abnormal posture and difficulty walking due to right knee pain ( OA). due  to her responsibilities in caring for her sisters, Pt requests 1 x a week to address these deficits and will benefit from skilled PT to return to PLOF without pain. in left back/hip. Pt consented to trigger point dry needling and was monitored throughtout treatment with good toleration of treatment.   Rehab Potential Good   PT Frequency 1x / week   PT Duration 8 weeks   PT Treatment/Interventions ADLs/Self Care Home Management;Cryotherapy;Electrical Stimulation;Iontophoresis 4mg /ml Dexamethasone;Moist Heat;Ultrasound;Gait training;Patient/family education;Neuromuscular re-education;Manual techniques;Passive range of motion;Taping;Dry needling   PT Next Visit Plan assess dry needling benefit and add to HEP for strength of bil HEP   PT Home Exercise Plan piriformis stretch and after care for trigger point dry needling   Consulted and Agree with Plan of Care Patient      Patient will benefit from skilled therapeutic intervention in order to improve the following deficits and impairments:  Difficulty walking, Pain, Postural dysfunction, Decreased strength, Decreased range of motion, Increased fascial restricitons  Visit Diagnosis: Acute left-sided low back pain with left-sided sciatica - Plan: PT plan of care cert/re-cert  Muscle spasm  of back - Plan: PT plan of care cert/re-cert  Pain in left hip - Plan: PT plan of care cert/re-cert  Difficulty in walking, not elsewhere classified - Plan: PT plan of care cert/re-cert  Muscle weakness (generalized) - Plan: PT plan of care cert/re-cert      G-Codes - [id number]    Functional Assessment Tool Used FOTO   Functional Limitation Mobility: Walking and moving around   Mobility: Walking and Moving Around Current Status [phone]) At least 40 percent but less than 60 percent impaired, limited or restricted  47%   Mobility: Walking and Moving Around Goal Status [phone]) At least 20 percent but less than 40 percent impaired, limited or restricted  28%        Problem List Patient Active Problem List   Diagnosis Date Noted  . Onychomycosis 10/13/2015  . 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 Abnormal EKG 02/05/2012  . Atrophic vaginitis   . Basal cell cancer 04/24/2011  . DIVERTICULOSIS, COLON 06/09/2009  . Hyperlipidemia 05/31/2008  . IRRITABLE BOWEL SYNDROME 05/27/2008  . Myalgia and myositis 05/27/2008  . Osteopenia 05/27/2008  . Chronic fatigue syndrome 05/27/2008   Voncille Lo, PT Exercise Expert for the Aging Adult  07/03/16 2:10 PM Phone: [phone] Fax: [phone]  By signing I understand that I am ordering/authorizing the use of Iontophoresis using 4 mg/mL of dexamethasone as a component of this plan of care. [place], [place], [address] Phone: [phone]   Fax:  [phone]  Name: NORLEEN DUMITRESCU MRN: [id number] Date of Birth: 1946-07-11

## 2016-07-10 ENCOUNTER — Ambulatory Visit: Payer: PPO | Admitting: Physical Therapy

## 2016-07-10 DIAGNOSIS — M5442 Lumbago with sciatica, left side: Secondary | ICD-10-CM

## 2016-07-10 DIAGNOSIS — R262 Difficulty in walking, not elsewhere classified: Secondary | ICD-10-CM

## 2016-07-10 DIAGNOSIS — M6281 Muscle weakness (generalized): Secondary | ICD-10-CM

## 2016-07-10 DIAGNOSIS — M62830 Muscle spasm of back: Secondary | ICD-10-CM

## 2016-07-10 DIAGNOSIS — M25552 Pain in left hip: Secondary | ICD-10-CM

## 2016-07-10 NOTE — Patient Instructions (Signed)
May order from [place] out strap   Heather Ramsey    PELVIC TILT  Lie on back, legs bent. Exhale, tilting top of pelvis back, pubic bone up, to flatten lower back. Inhale, rolling pelvis opposite way, top forward, pubic bone down, arch in back. Repeat __10__ times. Do __2__ sessions per day. Copyright  VHI. All rights reserved.    Isometric Hold With Pelvic Floor (Hook-Lying)  Lie with hips and knees bent. Slowly inhale, and then exhale. Pull navel toward spine and tighten pelvic floor. Hold for __10_ seconds. Continue to breathe in and out during hold. Rest for _10__ seconds. Repeat __10_ times. Do __2-3_ times a day.   Knee Fold  Lie on back, legs bent, arms by sides. Exhale, lifting knee to chest. Inhale, returning. Keep abdominals flat, navel to spine. Repeat __10__ times, alternating legs. Do __2__ sessions per day.  Knee Drop  Keep pelvis stable. Without rotating hips, slowly drop knee to side, pause, return to center, bring knee across midline toward opposite hip. Feel obliques engaging. Repeat for ___10_ times each leg.   Copyright  VHI. All rights reserved.       Heel Slide to Straight   Slide one leg down to straight. Return. Be sure pelvis does not rock forward, tilt, rotate, or tip to side. Do _10__ times. Restabilize pelvis. Repeat with other leg. Do __1-2_ sets, __2_ times per day.  [email or web address]://ss.[email or web address].[email or web address]    Quads / HF, Prone   Lie face down, knees together. Grasp one ankle with same-side hand. Use towel if needed to reach. Gently pull foot toward buttock. Hold 30- 60___ seconds. Repeat _2__ times per session. Do _2-3__ sessions per day.  Put a pillow underneath stomach before stretching.  Heather Ramsey try to contract and relax by trying to straighten knee and then relax and bend as a reminder.    Copyright  VHI. All rights reserved.  HIP: Flexion / KNEE: Extension, Straight Leg Raise   Raise leg, keeping knee straight. Perform slowly. 15___  reps per set, _2__ sets per day, _6-7__ days per week      [email or web address]://[email or web address].[email or web address]   Copyright  VHI. All rights reserved.     Raise leg until knee is straight. _15__ reps per set, _1-2__ sets per day, 6-7___ days per week     Copyright  VHI. All rights reserved.  Quad Set   Slowly tighten muscles on thigh of straight leg while counting out loud to _5___. Repeat with other leg. Repeat __15_ times. Do _1-2___ sessions per day.  [email or web address]://[email or web address].[email or web address]   Copyright  VHI. All rights reserved.   Voncille Lo, PT Exercise Expert for the Aging Adult  07/10/16 9:48 AM Phone: [phone] Fax: [phone]

## 2016-07-10 NOTE — Therapy (Signed)
[place], [place], [address] Phone: [phone]   Fax:  [phone]  Physical Therapy Treatment  Patient Details  Name: Heather Ramsey MRN: [id number] Date of Birth: Sep 02, 1946 Referring Provider: Bo Merino MD  Encounter Date: 07/10/2016      PT End of Session - 07/10/16 0935    Visit Number 2   Number of Visits 8   Date for PT Re-Evaluation 08/28/16   Authorization Type [employer]   PT Start Time 0933   PT Stop Time 1028   PT Time Calculation (min) 55 min   Activity Tolerance Patient tolerated treatment well   Behavior During Therapy [facility] for tasks assessed/performed      Past Medical History:  Diagnosis Date  . Allergy   . Atrophic vaginitis   . Chest pain    negative  cardiac evaluation; Dr Percival Spanish  . Diverticulosis of colon 2005& 2010   FH colon cancer  . Fibromyalgia    Dr Estanislado Pandy  . GERD (gastroesophageal reflux disease)   . Heart murmur   . Hemorrhoid   . Hyperlipidemia   . IBS (irritable bowel syndrome)   . Migraine   . MVP (mitral valve prolapse)   . Osteopenia   . Pulmonary nodule 07/2006  . UTI (lower urinary tract infection) 2005   Citrobacter koseri, hospitalized w/ kidney infection 1983    Past Surgical History:  Procedure Laterality Date  . BREAST BIOPSY Left 2003  . [place]   with bladder tack   . COLONOSCOPY  07/2014   negative; Dr Olevia Perches  . FOOT SURGERY Left 2014    There were no vitals filed for this visit.      Subjective Assessment - 07/10/16 0936    Subjective I [place] a [place] [employer]. Walked 11000 steps, left side back pain is better.  I dont have to hold onto bed like I did.     Pertinent History chronic fatigue syndrome, myalgias, hyperlipidimia , osteopenia   Limitations Sitting   Patient Stated Goals I want to be able to walk more, I compensate for    Currently in Pain? Yes   Pain Score 3     Pain Location Back   Pain Orientation Left   Pain Descriptors / Indicators Aching   Pain Type Acute pain   Pain Onset 1 to 4 weeks ago   Pain Frequency Intermittent   Pain Score 5   Pain Location Knee   Pain Orientation Right   Pain Descriptors / Indicators Aching   Pain Type Chronic pain   Pain Onset More than a month ago                         [facility] Adult PT Treatment/Exercise - 07/10/16 1001      Lumbar Exercises: Supine   Ab Set 10 reps  10 sec hold   Other Supine Lumbar Exercises prepilates clamshell x 10, pelvic tilt x 10  heel slide x 10 right and left all 10 x     Knee/Hip Exercises: Supine   Quad Sets 15 reps;Right;Strengthening  5 sec hold   Straight Leg Raises Right;15 reps;2 sets   Straight Leg Raises Limitations fatigues at 12 seconds set   Other Supine Knee/Hip Exercises LAQ right x 15           Trigger Point Dry Needling - 07/10/16 1004    Consent Given? Yes  Education Handout Provided No  previously given   Muscles Treated Lower Body Gluteus minimus;Gluteus maximus;Piriformis  left only   Gluteus Maximus Response Twitch response elicited;Palpable increased muscle length   Gluteus Minimus Response Twitch response elicited;Palpable increased muscle length   Piriformis Response Twitch response elicited;Palpable increased muscle length              PT Education - 07/10/16 0951    Education provided Yes   Education Details modified piriformis stretch added HEP Prepilates and quad set and SLR and LAQ   Person(s) Educated Patient   Methods Explanation;Demonstration;Tactile cues;Verbal cues;Handout   Comprehension Verbalized understanding;Returned demonstration             PT Long Term Goals - 07/10/16 0956      PT LONG TERM GOAL #1   Title Patient will be independent with advanced HEP   Time 8   Period Weeks   Status On-going     PT LONG TERM GOAL #2   Title Pain will decrease to 1/10 or less with all functional  activities   Time 8   Period Weeks   Status On-going     PT LONG TERM GOAL #3   Title "Pt will tolerate standing and walking for 2 hours without increased pain in order to return to PLOF and work   Period Weeks   Status On-going     PT LONG TERM GOAL #4   Title Pt will be able to return to walking for 2 miles without exacerbating pain   Time 8   Period Weeks   Status On-going     PT LONG TERM GOAL #5   Title Pt will be able to ascend/descend steps without exacerbating pain in left hip   Time 8   Period Weeks   Status On-going     PT LONG TERM GOAL #6   Title "FOTO will improve from 47% limitation   to 28% limitation    indicating improved functional mobility.        Time 8   Period Weeks   Status On-going               Plan - 07/10/16 1017    Clinical Impression Statement [name] arrives in clinic with 3/10 pain in left low back and hip mostly in piriformis.  she tolerated trigger point dry needling well.  she reports falling when someone ran into her at [employer] in [place] yesterday but does not report any problems except scab on left elbow.  Pt consents to trigger point dry needling today and is closely monitored throughout session and has marked localized twitch response on left piriformis.   Pt also educated on  basic LE strength and pre pilates exericises.  Pt  has 3/10 pain in back down from 6/10 on eval last visit.   Will continue to work toward goals   Rehab Potential Good   PT Frequency 1x / week   PT Duration 8 weeks   PT Treatment/Interventions ADLs/Self Care Home Management;Cryotherapy;Electrical Stimulation;Iontophoresis 4mg /ml Dexamethasone;Moist Heat;Ultrasound;Gait training;Patient/family education;Neuromuscular re-education;Manual techniques;Passive range of motion;Taping;Dry needling   PT Next Visit Plan assess dry needling benefit review exericises   PT Home Exercise Plan piriformis stretch supine only, pre pilates and initial LE  strength quad set, SLR and LAQ no weights   Consulted and Agree with Plan of Care Patient      Patient will benefit from skilled therapeutic intervention in order to improve the following deficits and impairments:  Difficulty walking, Pain, Postural dysfunction, Decreased strength, Decreased range of motion, Increased fascial restricitons  Visit Diagnosis: Acute left-sided low back pain with left-sided sciatica  Muscle spasm of back  Pain in left hip  Difficulty in walking, not elsewhere classified  Muscle weakness (generalized)     Problem List Patient Active Problem List   Diagnosis Date Noted  . Onychomycosis 10/13/2015  . 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 Abnormal EKG 02/05/2012  . Atrophic vaginitis   . Basal cell cancer 04/24/2011  . DIVERTICULOSIS, COLON 06/09/2009  . Hyperlipidemia 05/31/2008  . IRRITABLE BOWEL SYNDROME 05/27/2008  . Myalgia and myositis 05/27/2008  . Osteopenia 05/27/2008  . Chronic fatigue syndrome 05/27/2008    Dorothea Ogle 07/10/2016, 12:22 PM  [place] [facility] [address] [place], [place], [address] Phone: [phone]   Fax:  [phone]  Name: EMMAGRACE CICCHETTI MRN: [id number] Date of Birth: 02/09/1946

## 2016-07-12 ENCOUNTER — Ambulatory Visit (INDEPENDENT_AMBULATORY_CARE_PROVIDER_SITE_OTHER): Payer: PPO | Admitting: Rheumatology

## 2016-07-12 ENCOUNTER — Ambulatory Visit: Payer: PPO | Admitting: Rheumatology

## 2016-07-12 ENCOUNTER — Ambulatory Visit: Payer: Self-pay | Admitting: Rheumatology

## 2016-07-16 ENCOUNTER — Telehealth: Payer: Self-pay | Admitting: Rheumatology

## 2016-07-16 NOTE — Telephone Encounter (Signed)
Patient was here on 07/12/16, there are no notes in [facility] did you see patient and discuss Actonel? She is asking for it to be sent in to pharmacy now.

## 2016-07-16 NOTE — Telephone Encounter (Signed)
Pt called about rx for Actinal (pt states it is a new medication and she wasn't quite sure of the name?) [facility] in [place] does not have rx yet, please resend.

## 2016-07-16 NOTE — Telephone Encounter (Signed)
Yes. Ok to send Actonel  Pls  give  150mg  tab once monthly  w/ 2 refill. ( chart dictated but not scanned...may be transcribed by now.)

## 2016-07-17 ENCOUNTER — Ambulatory Visit: Payer: PPO | Admitting: Physical Therapy

## 2016-07-17 DIAGNOSIS — M25552 Pain in left hip: Secondary | ICD-10-CM

## 2016-07-17 DIAGNOSIS — M6281 Muscle weakness (generalized): Secondary | ICD-10-CM

## 2016-07-17 DIAGNOSIS — M62830 Muscle spasm of back: Secondary | ICD-10-CM

## 2016-07-17 DIAGNOSIS — M5442 Lumbago with sciatica, left side: Secondary | ICD-10-CM | POA: Diagnosis not present

## 2016-07-17 DIAGNOSIS — R262 Difficulty in walking, not elsewhere classified: Secondary | ICD-10-CM

## 2016-07-17 MED ORDER — RISEDRONATE SODIUM 150 MG PO TABS: 150.0000 mg | ORAL_TABLET | ORAL | 2 refills | Status: DC

## 2016-07-17 NOTE — Patient Instructions (Signed)
   HIP: Abduction / External Rotation (Band)   Place band around knees. Lie on side with hips and knees bent. Raise top knee up, squeezing glutes. Keep feet together. Hold _5__ seconds. Use __green_____ band. _10__ reps per set, _2__ sets per day, __1-2_ days per week  IONTOPHORESIS PATIENT PRECAUTIONS & CONTRAINDICATIONS:  . Redness under one or both electrodes can occur.  This characterized by a uniform redness that usually disappears within 12 hours of treatment. . Small pinhead size blisters may result in response to the drug.  Contact your physician if the problem persists more than 24 hours. . On rare occasions, iontophoresis therapy can result in temporary skin reactions such as rash, inflammation, irritation or burns.  The skin reactions may be the result of individual sensitivity to the ionic solution used, the condition of the skin at the start of treatment, reaction to the materials in the electrodes, allergies or sensitivity to dexamethasone, or a poor connection between the patch and your skin.  Discontinue using iontophoresis if you have any of these reactions and report to your therapist. . Remove the Patch or electrodes if you have any undue sensation of pain or burning during the treatment and report discomfort to your therapist. . Tell your Therapist if you have had known adverse reactions to the application of electrical current. . If using the Patch, the LED light will turn off when treatment is complete and the patch can be removed.  Approximate treatment time is 1-3 hours.  Remove the patch when light goes off or after 6 hours. . The Patch can be worn during normal activity, however excessive motion where the electrodes have been placed can cause poor contact between the skin and the electrode or uneven electrical current resulting in greater risk of skin irritation. Marland Kitchen Keep out of the reach of children.   . DO NOT use if you have a cardiac pacemaker or any other  electrically sensitive implanted device. . DO NOT use if you have a known sensitivity to dexamethasone. . DO NOT use during Magnetic Resonance Imaging (MRI). . DO NOT use over broken or compromised skin (e.g. sunburn, cuts, or acne) due to the increased risk of skin reaction. . DO NOT SHAVE over the area to be treated:  To establish good contact between the Patch and the skin, excessive hair may be clipped. . DO NOT place the Patch or electrodes on or over your eyes, directly over your heart, or brain. . DO NOT reuse the Patch or electrodes as this may cause burns to occur.   Voncille Lo, PT Exercise Expert for the Aging Adult  07/17/16 9:49 AM Phone: [phone] Fax: [phone]    .

## 2016-07-17 NOTE — Therapy (Signed)
[place], [place], [address] Phone: [phone]   Fax:  [phone]  Physical Therapy Treatment  Patient Details  Name: RABIA GORMLEY MRN: [id number] Date of Birth: [birth year] Referring Provider: Bo Merino MD  Encounter Date: 07/17/2016      PT End of Session - 07/17/16 0936    Visit Number 3   Number of Visits 8   Date for PT Re-Evaluation 08/28/16   Authorization Type [employer]   PT Start Time 0930   PT Stop Time 1027   PT Time Calculation (min) 57 min   Activity Tolerance Patient tolerated treatment well   Behavior During Therapy [facility] for tasks assessed/performed      Past Medical History:  Diagnosis Date  . Allergy   . Atrophic vaginitis   . Chest pain    negative  cardiac evaluation; Dr Percival Spanish  . Diverticulosis of colon 2005& 2010   FH colon cancer  . Fibromyalgia    Dr Estanislado Pandy  . GERD (gastroesophageal reflux disease)   . Heart murmur   . Hemorrhoid   . Hyperlipidemia   . IBS (irritable bowel syndrome)   . Migraine   . MVP (mitral valve prolapse)   . Osteopenia   . Pulmonary nodule 07/2006  . UTI (lower urinary tract infection) 2005   Citrobacter koseri, hospitalized w/ kidney infection 1983    Past Surgical History:  Procedure Laterality Date  . BREAST BIOPSY Left 2003  . [place]   with bladder tack   . COLONOSCOPY  07/2014   negative; Dr Olevia Perches  . FOOT SURGERY Left 2014    There were no vitals filed for this visit.      Subjective Assessment - 07/17/16 0933    Subjective This has been a worse week this week than last week.  I had gone to the [employer].  I normally walk 5000 steps a day but I did 11000 that day. My knee is bone on bone but I am waiting as long as possible   Pertinent History chronic fatigue syndrome, myalgias, hyperlipidimia , osteopenia   Limitations Sitting   How long can you sit comfortably? 90 minutes   How long can you stand comfortably? 2 hours with pain in right knee and left hip   Diagnostic tests none   Patient Stated Goals I want to be able to walk more, I compensate for    Currently in Pain? Yes   Pain Score 4    Pain Location Back  left hip   Pain Orientation Left   Pain Descriptors / Indicators Aching   Pain Type Chronic pain   Pain Onset More than a month ago   Pain Frequency Intermittent   Aggravating Factors  getting in and out of bed, getting out of car , stairs , household chores like vacuuming   Multiple Pain Sites Yes   Pain Score 5   Pain Location Knee   Pain Orientation Right   Pain Descriptors / Indicators Aching   Pain Type Chronic pain   Pain Onset More than a month ago   Aggravating Factors  going down stairs or taking too many steps.  squatting , pivoting on knee                         [facility] Adult PT Treatment/Exercise - 07/17/16 0937      Lumbar Exercises: Supine   Ab Set 10 reps  10 sec hold   Other Supine Lumbar Exercises prepilates clamshell x 10, pelvic tilt x 10  heel slide x 10 right and left all 10 x and VC TC for correct technique     Knee/Hip Exercises: Supine   Quad Sets 15 reps;Right;Strengthening  5 sec hold   Straight Leg Raises Right;15 reps;2 sets     Knee/Hip Exercises: Sidelying   Hip ABduction Left;1 set;5 reps   Hip ABduction Limitations needs VC and TC for correct alignment  encouraged her to get near wall for good alignment   Clams green t band 1 x 10 in right sidelying.     Modalities   Modalities Moist Heat     Moist Heat Therapy   Number Minutes Moist Heat 15 Minutes   Moist Heat Location Hip  left     Iontophoresis   Type of Iontophoresis Dexamethasone   Location left hip/piriformis post greater trochanter   Dose 1cc in patch   Time 8 min application  4-6 hour patch      Manual Therapy   Manual Therapy Soft tissue mobilization   Soft tissue mobilization left piriformis   Kinesiotex Facilitate  Muscle     Kinesiotix   Facilitate Muscle  right quad y strip to facilitate right quad and compression strip over patellar tubercle for 100%           Trigger Point Dry Needling - 07/17/16 1031    Consent Given? Yes   Education Handout Provided No   Piriformis Response Twitch response elicited;Palpable increased muscle length              PT Education - 07/17/16 1304    Education provided Yes   Education Details added iontophoresisi information and educated on possible benefits of KT tape for right quad. added hip abduction exericises   Person(s) Educated Patient   Methods Explanation;Demonstration;Tactile cues;Verbal cues;Handout   Comprehension Verbalized understanding;Returned demonstration             PT Long Term Goals - 07/10/16 0956      PT LONG TERM GOAL #1   Title Patient will be independent with advanced HEP   Time 8   Period Weeks   Status On-going     PT LONG TERM GOAL #2   Title Pain will decrease to 1/10 or less with all functional activities   Time 8   Period Weeks   Status On-going     PT LONG TERM GOAL #3   Title "Pt will tolerate standing and walking for 2 hours without increased pain in order to return to PLOF and work   Period Weeks   Status On-going     PT LONG TERM GOAL #4   Title Pt will be able to return to walking for 2 miles without exacerbating pain   Time 8   Period Weeks   Status On-going     PT LONG TERM GOAL #5   Title Pt will be able to ascend/descend steps without exacerbating pain in left hip   Time 8   Period Weeks   Status On-going     PT LONG TERM GOAL #6   Title "FOTO will improve from 47% limitation   to 28% limitation    indicating improved functional mobility.        Time 8   Period Weeks   Status On-going               Plan - 07/17/16 1014    Clinical  Impression Statement  Mrs. Mehring returns to clinic with increased pain in left hip 4/10 (3/10 last week) and 5/10 pain in right knee (3-4/10  last week.  Pt reports she did more walking than before.  Daily 5000 steps but was able to walk 11000 steps at [employer] last week.  Pt consented to trigger point dry needling for  left hip pain and was monitored throughout.  Pt was concerned about being very sore and also consented to Iontophoresis patch for specific pain on posteropr greater trochanter on left.  Pt needed review of exericises and needed correction for technique.  Pt did report that she really tries to perform exericisee daily at least once.  but benefit may have been decreased due to poor technique.  Will continue to reinforce core and LE strength    Rehab Potential Good   PT Frequency 1x / week   PT Duration 8 weeks   PT Treatment/Interventions ADLs/Self Care Home Management;Cryotherapy;Electrical Stimulation;Iontophoresis 4mg /ml Dexamethasone;Moist Heat;Ultrasound;Gait training;Patient/family education;Neuromuscular re-education;Manual techniques;Passive range of motion;Taping;Dry needling   PT Next Visit Plan assess dry needling/ ionto benefit of left hip,  tape on right knee.  check patellar mobility.   Review exericises as needed. progress core from pre pilates/ add hip strength  does only sidelying clam now   PT Home Exercise Plan piriformis stretch supine only, pre pilates and initial LE strength quad set, SLR and LAQ no weights, sidelying on right clamshell with green t band   Consulted and Agree with Plan of Care Patient      Patient will benefit from skilled therapeutic intervention in order to improve the following deficits and impairments:  Difficulty walking, Pain, Postural dysfunction, Decreased strength, Decreased range of motion, Increased fascial restricitons  Visit Diagnosis: Acute left-sided low back pain with left-sided sciatica  Muscle spasm of back  Pain in left hip  Difficulty in walking, not elsewhere classified  Muscle weakness (generalized)     Problem List Patient Active Problem List    Diagnosis Date Noted  . Onychomycosis 10/13/2015  . 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 Abnormal EKG 02/05/2012  . Atrophic vaginitis   . Basal cell cancer 04/24/2011  . DIVERTICULOSIS, COLON 06/09/2009  . Hyperlipidemia 05/31/2008  . IRRITABLE BOWEL SYNDROME 05/27/2008  . Myalgia and myositis 05/27/2008  . Osteopenia 05/27/2008  . Chronic fatigue syndrome 05/27/2008    Voncille Lo, PT Exercise Expert for the Aging Adult  07/17/16 1:13 PM Phone: [phone] Fax: [place] [facility] [address] [place], [place], [address] Phone: [phone]   Fax:  [phone]  Name: ZEPHA SALVINO MRN: [id number] Date of Birth: 1946-09-21

## 2016-07-17 NOTE — Telephone Encounter (Signed)
I have sent this in, but did get a warning regarding headaches with this medication, want to make sure she is aware. Have advised her if she does develop h/a to d/c meds and push water.

## 2016-07-30 DIAGNOSIS — G894 Chronic pain syndrome: Secondary | ICD-10-CM | POA: Diagnosis not present

## 2016-07-30 DIAGNOSIS — H11442 Conjunctival cysts, left eye: Secondary | ICD-10-CM | POA: Diagnosis not present

## 2016-07-30 DIAGNOSIS — E1142 Type 2 diabetes mellitus with diabetic polyneuropathy: Secondary | ICD-10-CM | POA: Diagnosis not present

## 2016-07-30 DIAGNOSIS — H04123 Dry eye syndrome of bilateral lacrimal glands: Secondary | ICD-10-CM | POA: Diagnosis not present

## 2016-07-30 DIAGNOSIS — M961 Postlaminectomy syndrome, not elsewhere classified: Secondary | ICD-10-CM | POA: Diagnosis not present

## 2016-07-30 DIAGNOSIS — Z79891 Long term (current) use of opiate analgesic: Secondary | ICD-10-CM | POA: Diagnosis not present

## 2016-07-31 ENCOUNTER — Ambulatory Visit: Payer: PPO | Attending: Rheumatology | Admitting: Physical Therapy

## 2016-07-31 DIAGNOSIS — M62830 Muscle spasm of back: Secondary | ICD-10-CM | POA: Insufficient documentation

## 2016-07-31 DIAGNOSIS — M5442 Lumbago with sciatica, left side: Secondary | ICD-10-CM | POA: Diagnosis not present

## 2016-07-31 DIAGNOSIS — R262 Difficulty in walking, not elsewhere classified: Secondary | ICD-10-CM | POA: Insufficient documentation

## 2016-07-31 DIAGNOSIS — M6281 Muscle weakness (generalized): Secondary | ICD-10-CM | POA: Diagnosis not present

## 2016-07-31 DIAGNOSIS — M25552 Pain in left hip: Secondary | ICD-10-CM | POA: Diagnosis not present

## 2016-07-31 DIAGNOSIS — Z803 Family history of malignant neoplasm of breast: Secondary | ICD-10-CM | POA: Diagnosis not present

## 2016-07-31 DIAGNOSIS — Z1231 Encounter for screening mammogram for malignant neoplasm of breast: Secondary | ICD-10-CM | POA: Diagnosis not present

## 2016-07-31 LAB — HM MAMMOGRAPHY

## 2016-07-31 NOTE — Therapy (Signed)
[place], [place], [address] Phone: [phone]   Fax:  [phone]  Physical Therapy Treatment  Patient Details  Name: Heather Ramsey MRN: [id number] Date of Birth: 23-Apr-1946 Referring Provider: Bo Merino MD  Encounter Date: 07/31/2016      PT End of Session - 07/31/16 1149    Visit Number 4   Number of Visits 8   Date for PT Re-Evaluation 08/28/16   PT Start Time 1016   PT Stop Time 1100   PT Time Calculation (min) 44 min   Activity Tolerance Patient tolerated treatment well   Behavior During Therapy [facility] for tasks assessed/performed      Past Medical History:  Diagnosis Date  . Allergy   . Atrophic vaginitis   . Chest pain    negative  cardiac evaluation; Dr Percival Spanish  . Diverticulosis of colon 2005& 2010   FH colon cancer  . Fibromyalgia    Dr Estanislado Pandy  . GERD (gastroesophageal reflux disease)   . Heart murmur   . Hemorrhoid   . Hyperlipidemia   . IBS (irritable bowel syndrome)   . Migraine   . MVP (mitral valve prolapse)   . Osteopenia   . Pulmonary nodule 07/2006  . UTI (lower urinary tract infection) 2005   Citrobacter koseri, hospitalized w/ kidney infection 1983    Past Surgical History:  Procedure Laterality Date  . BREAST BIOPSY Left 2003  . [place]   with bladder tack   . COLONOSCOPY  07/2014   negative; Dr Olevia Perches  . FOOT SURGERY Left 2014    There were no vitals filed for this visit.      Subjective Assessment - 07/31/16 1022    Subjective "I am doing fair today, last week my pain level went to a 7-8/10 which was from alot of steps/ stairs"    Currently in Pain? Yes   Pain Score 4    Pain Location Back   Pain Orientation Left   Pain Descriptors / Indicators Aching   Pain Type Chronic pain   Pain Onset More than a month ago   Pain Frequency Intermittent                         [facility] Adult PT Treatment/Exercise -  07/31/16 0001      Knee/Hip Exercises: Stretches   Piriformis Stretch 5 reps;30 seconds     Knee/Hip Exercises: Sidelying   Hip ABduction AROM;Strengthening;Right;2 sets;10 reps     Iontophoresis   Type of Iontophoresis Dexamethasone   Location left hip/piriformis post greater trochanter   Dose 1cc in patch   Time 6 hour patch     Manual Therapy   Manual Therapy Muscle Energy Technique;Joint mobilization   Manual therapy comments tach and stretch of the piriformis on the R 10 x 5 sec hold   Joint Mobilization grade 1-2 long axis distraction, R inferior sacral angle PA mobs with pt breathing in and out   Muscle Energy Technique L innominate medial compression with hip abduction                PT Education - 07/31/16 1148    Education Details reviewed HEP and updated with form and treatment rationale   Person(s) Educated Patient   Methods Explanation;Verbal cues;Handout   Comprehension Verbal cues required             PT Long Term Goals - 07/10/16 0932  PT LONG TERM GOAL #1   Title Patient will be independent with advanced HEP   Time 8   Period Weeks   Status On-going     PT LONG TERM GOAL #2   Title Pain will decrease to 1/10 or less with all functional activities   Time 8   Period Weeks   Status On-going     PT LONG TERM GOAL #3   Title "Pt will tolerate standing and walking for 2 hours without increased pain in order to return to PLOF and work   Period Weeks   Status On-going     PT LONG TERM GOAL #4   Title Pt will be able to return to walking for 2 miles without exacerbating pain   Time 8   Period Weeks   Status On-going     PT LONG TERM GOAL #5   Title Pt will be able to ascend/descend steps without exacerbating pain in left hip   Time 8   Period Weeks   Status On-going     PT LONG TERM GOAL #6   Title "FOTO will improve from 47% limitation   to 28% limitation    indicating improved functional mobility.        Time 8   Period Weeks    Status On-going               Plan - 07/31/16 1149    Clinical Impression Statement [name] continues to report little to no changes in pain or function since previous sessions with pain reported at 4/10 today. worked on [place] and piriformis strengthening and L innominate mobs with MET techniques to work on alignment. following stretching and mobs she reported soreness with mild relief of pain and tightness that relieved with prolong walking/ standing.    PT Next Visit Plan assess dry needling/ ionto benefit of left hip,  tape on right knee.  assess patellar mobility, Review exericises as needed. progress core from pre pilates/ add hip strength   PT Home Exercise Plan piriformis stretch supine only, pre pilates and initial LE strength quad set, SLR and LAQ no weights, sidelying on right clamshell with green t band, sidelying hip abduction, piriformis stretching   Consulted and Agree with Plan of Care Patient      Patient will benefit from skilled therapeutic intervention in order to improve the following deficits and impairments:     Visit Diagnosis: Acute left-sided low back pain with left-sided sciatica  Muscle spasm of back  Pain in left hip  Difficulty in walking, not elsewhere classified  Muscle weakness (generalized)     Problem List Patient Active Problem List   Diagnosis Date Noted  . Onychomycosis 10/13/2015  . 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 Abnormal EKG 02/05/2012  . Atrophic vaginitis   . Basal cell cancer 04/24/2011  . DIVERTICULOSIS, COLON 06/09/2009  . Hyperlipidemia 05/31/2008  . IRRITABLE BOWEL SYNDROME 05/27/2008  . Myalgia and myositis 05/27/2008  . Osteopenia 05/27/2008  . Chronic fatigue syndrome 05/27/2008   [name] PT, DPT, LAT, ATC  07/31/16  11:54 AM      [facility] [address] [place], [place], [address] Phone:  [phone]   Fax:  [phone]  Name: Heather Ramsey MRN: [id number] Date of Birth: 01/20/1946

## 2016-08-06 ENCOUNTER — Telehealth: Payer: Self-pay | Admitting: Rheumatology

## 2016-08-06 NOTE — Telephone Encounter (Signed)
Patient is interested in Euflexxa injections. She was told she could start them at the end of November. Please advise.

## 2016-08-07 ENCOUNTER — Ambulatory Visit: Payer: PPO | Admitting: Physical Therapy

## 2016-08-07 DIAGNOSIS — M6281 Muscle weakness (generalized): Secondary | ICD-10-CM

## 2016-08-07 DIAGNOSIS — M62830 Muscle spasm of back: Secondary | ICD-10-CM

## 2016-08-07 DIAGNOSIS — M25552 Pain in left hip: Secondary | ICD-10-CM

## 2016-08-07 DIAGNOSIS — R262 Difficulty in walking, not elsewhere classified: Secondary | ICD-10-CM

## 2016-08-07 DIAGNOSIS — M5442 Lumbago with sciatica, left side: Secondary | ICD-10-CM | POA: Diagnosis not present

## 2016-08-07 NOTE — Telephone Encounter (Signed)
Okay to apply for Euflex of both knees 3 in January 2018. Once approved we can start her injections thereafter.  Please notify patient. If she has significant pain and wants a cortisone injection before January 2018, we can give her cortisone if appropriate.  According to the patient's chart in [place], her last injection for Euflex was given 04/07/2014. And patient states that it only lasted her/benefited her for 6 weeks.

## 2016-08-07 NOTE — Telephone Encounter (Signed)
Message sent to patient regarding injections  Mr Heather Ramsey wants to apply for Euflexxa in Jan

## 2016-08-07 NOTE — Therapy (Signed)
[place], [place], [address] Phone: [phone]   Fax:  [phone]  Physical Therapy Treatment  Patient Details  Name: Heather Ramsey MRN: [id number] Date of Birth: [birth year] Referring Provider: Bo Merino MD  Encounter Date: 08/07/2016      PT End of Session - 08/07/16 0931    Visit Number 5   Number of Visits 8   Date for PT Re-Evaluation 08/28/16   Authorization Type [employer]   PT Start Time 0931   PT Stop Time 1029   PT Time Calculation (min) 58 min   Activity Tolerance Patient tolerated treatment well   Behavior During Therapy [facility] for tasks assessed/performed      Past Medical History:  Diagnosis Date  . Allergy   . Atrophic vaginitis   . Chest pain    negative  cardiac evaluation; Dr Percival Spanish  . Diverticulosis of colon 2005& 2010   FH colon cancer  . Fibromyalgia    Dr Estanislado Pandy  . GERD (gastroesophageal reflux disease)   . Heart murmur   . Hemorrhoid   . Hyperlipidemia   . IBS (irritable bowel syndrome)   . Migraine   . MVP (mitral valve prolapse)   . Osteopenia   . Pulmonary nodule 07/2006  . UTI (lower urinary tract infection) 2005   Citrobacter koseri, hospitalized w/ kidney infection 1983    Past Surgical History:  Procedure Laterality Date  . BREAST BIOPSY Left 2003  . [place]   with bladder tack   . COLONOSCOPY  07/2014   negative; Dr Olevia Perches  . FOOT SURGERY Left 2014    There were no vitals filed for this visit.      Subjective Assessment - 08/07/16 0934    Subjective I used to hate to sit but now it is most comfortable position.  I am living on ibuprofen . My pain level is a 5/10, The pain is making me cry.  The patch helped  me a lot.   Pertinent History chronic fatigue syndrome, myalgias, hyperlipidimia , osteopenia   How long can you sit comfortably? 2 hours   How long can you stand comfortably? standing hurts the most  even doing dishes, or put make up on   How long can you walk comfortably? walking more than 1/2 mile   Currently in Pain? Yes   Pain Score 5    Pain Location Back   Pain Orientation Left   Pain Descriptors / Indicators Aching   Pain Type Chronic pain   Pain Onset More than a month ago   Pain Frequency Intermittent   Pain Score 3   Pain Location Knee   Pain Orientation Right                         [facility] Adult PT Treatment/Exercise - 08/07/16 1014      Lumbar Exercises: Supine   Ab Set 10 reps  10 sec hold     Knee/Hip Exercises: Stretches   Piriformis Stretch 5 reps;30 seconds   Piriformis Stretch Limitations pt with pain even with stretching.     Knee/Hip Exercises: Sidelying   Hip ABduction Left;10 reps;Strengthening   Hip ABduction Limitations needs VC and TC for correct alignment emphazized again.    encouraged her to get near wall for good alignment   [place] PT providing resisitance for hip abduction     Modalities   Modalities Moist Heat  Moist Heat Therapy   Number Minutes Moist Heat 15 Minutes   Moist Heat Location Hip  left     Iontophoresis   Type of Iontophoresis Dexamethasone   Location left hip/piriformis post greater trochanter   Dose 1cc in patch   Time 6 hour patch     Manual Therapy   Manual Therapy Muscle Energy Technique;Joint mobilization   Manual therapy comments --   Joint Mobilization grade 1-2 long axis distraction, R inferior sacral angle PA mobs with pt breathing in and out   Soft tissue mobilization left piriformis and left lumbosacral and also L-4/5 left  PA glide grade 3 with illiciting pain into buttock.   Muscle Energy Technique L innominate medial compression with hip abduction          Trigger Point Dry Needling - 08/07/16 0943    Consent Given? Yes   Education Handout Provided No  previously given   Muscles Treated Lower Body Gluteus minimus;Gluteus maximus;Piriformis  left lumbosacral area, twitch    Gluteus Maximus Response Twitch response elicited;Palpable increased muscle length   Gluteus Minimus Response Twitch response elicited;Palpable increased muscle length   Piriformis Response Twitch response elicited;Palpable increased muscle length                   PT Long Term Goals - 07/10/16 0956      PT LONG TERM GOAL #1   Title Patient will be independent with advanced HEP   Time 8   Period Weeks   Status On-going     PT LONG TERM GOAL #2   Title Pain will decrease to 1/10 or less with all functional activities   Time 8   Period Weeks   Status On-going     PT LONG TERM GOAL #3   Title "Pt will tolerate standing and walking for 2 hours without increased pain in order to return to PLOF and work   Period Weeks   Status On-going     PT LONG TERM GOAL #4   Title Pt will be able to return to walking for 2 miles without exacerbating pain   Time 8   Period Weeks   Status On-going     PT LONG TERM GOAL #5   Title Pt will be able to ascend/descend steps without exacerbating pain in left hip   Time 8   Period Weeks   Status On-going     PT LONG TERM GOAL #6   Title "FOTO will improve from 47% limitation   to 28% limitation    indicating improved functional mobility.        Time 8   Period Weeks   Status On-going               Plan - 08/07/16 1204    Clinical Impression Statement Mrs. Gadsby reports she is no better and she also reports that she has pain with bowel movements with straining.  Pt seems to be more symptomatic with Pain from back L-4/5 radiating into buttocks this week.  Pt reports that she is trying to do exercises but the stretches seem to hurt .  Pt consented to trigger point dry needling today for gluteals and piriformis as well as lumbosacral area , exericises and soft tissue with movement in ER/IR as well as MET and L inominate mobs to work on alignment.  Pt  was told if she was markedly improved and continued with increasing pain , she  would need to retunen to MD  for furnther work up.  Pt was 5/10 and improved to 3/4 or 10 pain, but she reports she is having more trouble sitting now when she was having pain just stanidng mostly.     Rehab Potential Good   PT Frequency 1x / week   PT Duration 8 weeks   PT Treatment/Interventions ADLs/Self Care Home Management;Cryotherapy;Electrical Stimulation;Iontophoresis 32m/ml Dexamethasone;Moist Heat;Ultrasound;Gait training;Patient/family education;Neuromuscular re-education;Manual techniques;Passive range of motion;Taping;Dry needling   PT Next Visit Plan Assess progress,  treat for discal pain in lumbar,  progress report for MD next visit   PT Home Exercise Plan HEP with piriformis, glut med strength   Consulted and Agree with Plan of Care Patient      Patient will benefit from skilled therapeutic intervention in order to improve the following deficits and impairments:  Difficulty walking, Pain, Postural dysfunction, Decreased strength, Decreased range of motion, Increased fascial restricitons  Visit Diagnosis: Acute left-sided low back pain with left-sided sciatica  Muscle spasm of back  Pain in left hip  Difficulty in walking, not elsewhere classified  Muscle weakness (generalized)     Problem List Patient Active Problem List   Diagnosis Date Noted  . Onychomycosis 10/13/2015  . 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 Abnormal EKG 02/05/2012  . Atrophic vaginitis   . Basal cell cancer 04/24/2011  . DIVERTICULOSIS, COLON 06/09/2009  . Hyperlipidemia 05/31/2008  . IRRITABLE BOWEL SYNDROME 05/27/2008  . Myalgia and myositis 05/27/2008  . Osteopenia 05/27/2008  . Chronic fatigue syndrome 05/27/2008    BDorothea Ogle11/14/2017, 12:12 PM  [facility] [address]Phone: [phone]  Fax:  [phone] Name: Heather SUCHOCKIMRN: [id number] of Birth:  [birth year]

## 2016-08-10 ENCOUNTER — Telehealth: Payer: Self-pay | Admitting: Rheumatology

## 2016-08-10 ENCOUNTER — Other Ambulatory Visit: Payer: Self-pay | Admitting: Rheumatology

## 2016-08-10 MED ORDER — TRAMADOL HCL 50 MG PO TABS: 50.0000 mg | ORAL_TABLET | Freq: Three times a day (TID) | ORAL | 0 refills | Status: DC | PRN

## 2016-08-10 NOTE — Telephone Encounter (Signed)
Prescription called to the pharmacy  

## 2016-08-10 NOTE — Telephone Encounter (Signed)
I have addressed this [employer] syringe. Please see the recent message done earlier were I gave her 20 tablets of tramadol. She can take 1 pill every 8 hours when necessary pain. Then she can take ibuprofen intermittently between tramadol doses at 600 mg 3 times a day with food.This dosing regimen is just to address the patient's acute pain. She has an appointment with Dr. Durward Fortes on Monday and she can get further pain management at that time. This is a 1 time pain management from our office.

## 2016-08-10 NOTE — Telephone Encounter (Signed)
Patient want to have Euflexxa injections the first 3 weeks of November.

## 2016-08-10 NOTE — Telephone Encounter (Signed)
Patient states she has been having this pain since October 2017. Patient states it was aggravated she by PT. Patient states is unable to stand still because the pain is so bad. Patient states she has an appointment with Dr.Whitfield on Monday 08/13/16.

## 2016-08-10 NOTE — Telephone Encounter (Signed)
See previous note

## 2016-08-10 NOTE — Telephone Encounter (Signed)
Patient would like something called in today if possible for her back pain. Sent a message earlier about her problems.

## 2016-08-10 NOTE — Progress Notes (Signed)
Addendum note to nurses phone call, Gwenlyn Perking.  Patient called our office today complaining about back pain. She has seen her physical therapist but the physical therapy has made her back pain worse.  She has an appointment with Dr. Durward Fortes on Monday.  In the meanwhile she is having substantial pain and wants to know what she can do. She is taking ibuprofen 800 mg every 4 hours.  I feel that ibuprofen at every 4 hours is inappropriate and to manage the patient's pain temporarily until she can see Dr. Durward Fortes I will call him some tramadol. She will do the following.  Tramadol 50 mg 1 by mouth 3 times a day when necessary pain dispensed 20 pills with no refills  She can take 600 mg of ibuprofen every 8 hours in between the tramadol doses.   I have discussed this with the patient and she understands how to take the medication.  She does not have any allergies to tramadol.  She was instructed to take her medications with food when appropriate.

## 2016-08-10 NOTE — Telephone Encounter (Signed)
Patient's last day of PT will be 11/21 because the therapist thinks she could be doing more damage than helping it. Pt is in a lot of pain. Patient is taking 800mg  ibuprophen every 4 hours. Patient is having pain from her back down her left leg into her foot.

## 2016-08-12 ENCOUNTER — Encounter: Payer: Self-pay | Admitting: Internal Medicine

## 2016-08-13 ENCOUNTER — Ambulatory Visit (INDEPENDENT_AMBULATORY_CARE_PROVIDER_SITE_OTHER): Payer: PPO

## 2016-08-13 ENCOUNTER — Encounter (INDEPENDENT_AMBULATORY_CARE_PROVIDER_SITE_OTHER): Payer: Self-pay | Admitting: Orthopaedic Surgery

## 2016-08-13 ENCOUNTER — Ambulatory Visit (INDEPENDENT_AMBULATORY_CARE_PROVIDER_SITE_OTHER): Payer: PPO | Admitting: Orthopaedic Surgery

## 2016-08-13 VITALS — BP 153/84 | HR 68 | Resp 14 | Ht 61.5 in | Wt 123.0 lb

## 2016-08-13 DIAGNOSIS — G8929 Other chronic pain: Secondary | ICD-10-CM

## 2016-08-13 DIAGNOSIS — M5442 Lumbago with sciatica, left side: Secondary | ICD-10-CM

## 2016-08-13 NOTE — Progress Notes (Signed)
Office Visit Note   Patient: Heather Ramsey           Date of Birth: 24-Oct-1945           MRN: [id number] Visit Date: 08/13/2016              Requested by: Binnie Rail, MD [place], [place] [address] PCP: Binnie Rail, MD   Assessment & Plan: Visit Diagnoses: No diagnosis found.  Plan: f/u after MRI L-S spine. Her pain is chronic and it seems that she does have sciatica. I think its worth obtaining an MRI scan to further define her problem. There are some degenerative changes at L4-5 and L5-S1 with. It could be that the pain that she is experiencing is his referred pain from the  Arthritis.  Follow-Up Instructions: No Follow-up on file.   Orders:  No orders of the defined types were placed in this encounter.  No orders of the defined types were placed in this encounter.     Procedures: No procedures performed   Clinical Data: No additional findings.   Subjective: Chief Complaint  Patient presents with  . Lower Back - Pain    Dr. Estanislado Pandy patient, going to PT, sciatic pain - left sided, difficulty walking, difficulty sleeping, IBU - helps 1000 mg daily, nerve pain coming up from left foot into calf post ankle surgery - Dr. Redmond Pulling at 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 Greb has seen Dr. 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 She's been going to physical therapy and she is really not sure is made much of a difference. He does have low back pain with referred discomfort into her left leg as far distally as her knee. As mentioned above she has a chronic problem with her left ankle with PVNS treated at [facility]. She feels like there is some referred pain from her ankle more proximally to her knee. He denies groin pain. Does have low back pain associated with left buttock discomfort. She denies any recent change in bowel or bladder dysfunction. She is  really not had any numbness or tingling. Does have history of fibromyalgia.  Review of Systems   Objective: Vital Signs: BP (!) 153/84 (BP Location: Left Arm, Patient Position: Sitting, Cuff Size: Large)   Pulse 68   Resp 14   Ht 5' 1.5" (1.562 m)   Wt 123 lb (55.8 kg)   BMI 22.86 kg/m   Physical Exam  Ortho Exam examination revealed negative straight leg raise. She had a little bit of discomfort with range of motion of both knees but no effusions or full flexion and extension of both knees. She denied any pain with internal/external rotation of either hip there was no percussible tenderness the lumbar spine.  Specialty Comments:  No specialty comments available.  Imaging: No results found.   PMFS History: Patient Active Problem List   Diagnosis Date Noted  . Onychomycosis 10/13/2015  . 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 Abnormal EKG 02/05/2012  . Atrophic vaginitis   . Basal cell cancer 04/24/2011  . DIVERTICULOSIS, COLON 06/09/2009  . Hyperlipidemia 05/31/2008  . IRRITABLE BOWEL SYNDROME 05/27/2008  . Myalgia and myositis 05/27/2008  . Osteopenia 05/27/2008  . Chronic fatigue syndrome 05/27/2008   Past Medical History:  Diagnosis Date  . Allergy   . Atrophic vaginitis   . Chest  pain    negative  cardiac evaluation; Dr Percival Spanish  . Diverticulosis of colon 2005& 2010   FH colon cancer  . Fibromyalgia    Dr Estanislado Pandy  . GERD (gastroesophageal reflux disease)   . Heart murmur   . Hemorrhoid   . Hyperlipidemia   . IBS (irritable bowel syndrome)   . Migraine   . MVP (mitral valve prolapse)   . Osteopenia   . Pulmonary nodule 07/2006  . UTI (lower urinary tract infection) 2005   Citrobacter koseri, hospitalized w/ kidney infection 1983    Family History  Problem Relation Age of Onset  . Heart attack Father 2    Died with multiple medical problems  . Colon cancer Father 74  . Diabetes Father   . Tuberculosis Paternal Grandmother   .  Stroke Mother 1  . Hypothyroidism Mother   . Hypertension Mother   . Hypothyroidism Sister   . Hypertension Sister   . Breast cancer Sister   . Diabetes Sister   . Heart attack Sister     2 sisters > 51    Past Surgical History:  Procedure Laterality Date  . BREAST BIOPSY Left 2003  . [place]   with bladder tack   . COLONOSCOPY  07/2014   negative; Dr Olevia Perches  . FOOT SURGERY Left 2014   Social History   Occupational History  . Not on file.   Social History Main Topics  . Smoking status: Never Smoker  . Smokeless tobacco: Never Used  . Alcohol use No  . Drug use: No  . Sexual activity: Yes    Birth control/ protection: Post-menopausal

## 2016-08-14 ENCOUNTER — Ambulatory Visit: Payer: PPO | Admitting: Physical Therapy

## 2016-08-14 DIAGNOSIS — M25552 Pain in left hip: Secondary | ICD-10-CM

## 2016-08-14 DIAGNOSIS — M5442 Lumbago with sciatica, left side: Secondary | ICD-10-CM

## 2016-08-14 DIAGNOSIS — M6281 Muscle weakness (generalized): Secondary | ICD-10-CM

## 2016-08-14 DIAGNOSIS — R262 Difficulty in walking, not elsewhere classified: Secondary | ICD-10-CM

## 2016-08-14 DIAGNOSIS — M62830 Muscle spasm of back: Secondary | ICD-10-CM

## 2016-08-14 NOTE — Therapy (Addendum)
[place], [place], [address] Phone: [phone]   Fax:  [phone]  Physical Therapy Treatment/Discharge Note  Patient Details  Name: Heather Ramsey MRN: [id number] Date of Birth: 25-Dec-1945 Referring Provider: Bo Merino MD  Encounter Date: 08/14/2016      PT End of Session - 08/14/16 0933    Visit Number 6   Number of Visits 8   Date for PT Re-Evaluation 08/28/16   Authorization Type [employer]   PT Start Time 0932      Past Medical History:  Diagnosis Date  . Allergy   . Atrophic vaginitis   . Chest pain    negative  cardiac evaluation; Dr Percival Spanish  . Diverticulosis of colon 2005& 2010   FH colon cancer  . Fibromyalgia    Dr Estanislado Pandy  . GERD (gastroesophageal reflux disease)   . Heart murmur   . Hemorrhoid   . Hyperlipidemia   . IBS (irritable bowel syndrome)   . Migraine   . MVP (mitral valve prolapse)   . Osteopenia   . Pulmonary nodule 07/2006  . UTI (lower urinary tract infection) 2005   Citrobacter koseri, hospitalized w/ kidney infection 1983    Past Surgical History:  Procedure Laterality Date  . BREAST BIOPSY Left 2003  . [place]   with bladder tack   . COLONOSCOPY  07/2014   negative; Dr Olevia Perches  . FOOT SURGERY Left 2014    There were no vitals filed for this visit.      Subjective Assessment - 08/14/16 0934    Subjective I called the doctor/ rheumatologist Dr Lillia Carmel. I got medication and I was allergic to it and I threw up and I fainted.  I saw Dr. Durward Fortes yesterday and he has ordered and MRI. Tomorrow  I return to the MD to get an injection or oral pain medication.   I am afraid about my trip to [place].    Pertinent History chronic fatigue syndrome, myalgias, hyperlipidimia , osteopenia   Limitations Sitting;Walking;Standing   How long can you sit comfortably? 2 hours   How long can you stand comfortably? 5 min   How  long can you walk comfortably? 5 min   Diagnostic tests none   Patient Stated Goals I want to be able to walk more, I compensate for    Currently in Pain? Yes   Pain Score 7    Pain Location Back   Pain Orientation Left   Pain Descriptors / Indicators Aching   Pain Type Chronic pain   Pain Onset More than a month ago   Pain Frequency Intermittent   Pain Score 3   Pain Location Knee   Pain Orientation Right   Pain Descriptors / Indicators Aching   Pain Type Chronic pain   Pain Onset More than a month ago   Pain Frequency Intermittent            [facility] PT Assessment - 08/14/16 0935      Observation/Other Assessments   Focus on Therapeutic Outcomes (FOTO)  FOTO intake 30%, limitaton 70% predicted 28%     AROM   Lumbar Flexion 70   Lumbar Extension 20  ERP   Lumbar - Right Side Bend 25   Lumbar - Left Side Bend 20  pain in lateral thigh and anterior quad   Lumbar - Right Rotation 50%   Lumbar - Left Rotation 80%     Strength   Overall Strength  Deficits   Overall Strength Comments not able to test due to pt pain in left back and hip and lateral thigh                     [facility] Adult PT Treatment/Exercise - 08/14/16 0957      Self-Care   Self-Care Other Self-Care Comments   Other Self-Care Comments  discussed community resources in town after evaluation by MD.s     Lumbar Exercises: Supine   Ab Set 10 reps  10 sec hold   Clam 10 reps;3 seconds   Bent Knee Raise 10 reps;3 seconds   Bent Knee Raise Limitations Pain in left hip and lateral thigh into anterior quad   Other Supine Lumbar Exercises Decompression Exericises position 5 min, shoulder press x 5, head press x 5 , leg press 6x and leg lengthener 6x utilizing right and left leg  VC and TC for correct execution   Other Supine Lumbar Exercises Pre pilates  pelvic tilt x 10 I, Isometric abd hold for 10 sec with proper breathing x 10,      Manual Therapy   Manual Therapy Joint mobilization   Joint  Mobilization grade 1-2 long axis distraction, R inferior sacral angle PA mobs with pt breathing in and out  to decrease pain during exercise                      PT Long Term Goals - 08/14/16 1353      PT LONG TERM GOAL #1   Title Patient will be independent with advanced HEP   Baseline modified to only decompression and pre pilates exericises   Time 8   Period Weeks   Status Achieved     PT LONG TERM GOAL #2   Title Pain will decrease to 1/10 or less with all functional activities   Baseline Pt is at 7/10 today   Time 8   Period Weeks   Status Not Met     PT LONG TERM GOAL #3   Title "Pt will tolerate standing and walking for 2 hours without increased pain in order to return to PLOF and work   Baseline Tolerating only 5 to 15 minutes. Pain is constant   Time 8   Period Weeks   Status Not Met     PT LONG TERM GOAL #4   Title Pt will be able to return to walking for 2 miles without exacerbating pain   Baseline unable   Status Not Met     PT LONG TERM GOAL #5   Title Pt will be able to ascend/descend steps without exacerbating pain in left hip   Time 8   Period Weeks   Status Not Met     PT LONG TERM GOAL #6   Title "FOTO will improve from 47% limitation   to 28% limitation    indicating improved functional mobility.        Baseline FOTO limitation 70%   Status Not Met               Plan - 08/14/16 1349    Clinical Impression Statement Heather Ramsey returns today to PT but is no better and even worse than on evaluation. Since last visit, she has recieved medication from her rheumatologist for pain but was allergic and vomited and fainted.  She was seen by Dr. Durward Fortes yesterday and an MRI is being ordered.  Pt has not been able to get  any relief from left back,hip and radiating pain into lateral thigh and anterior quad muscle.  Heather Ramsey does not tolerate any vigorous exericise and even with gentlle pre pilates,and decompression exeriicises her  left nerve pain increases.   Pt did not achieve most of goals for PT and will be discharged in order to pursue further evaluation by MD.  Possible injection tomorrow and will have MRI.  Pt was educated on local community resources for gentle exericise once she can recieve pain relief medicinally. Discharge to MD's care   PT Frequency 1x / week   PT Duration 8 weeks   PT Treatment/Interventions ADLs/Self Care Home Management;Cryotherapy;Electrical Stimulation;Iontophoresis 20m/ml Dexamethasone;Moist Heat;Ultrasound;Gait training;Patient/family education;Neuromuscular re-education;Manual techniques;Passive range of motion;Taping;Dry needling   PT Next Visit Plan DC   PT Home Exercise Plan Decompression and Pre pilates as pt can tolerate   Consulted and Agree with Plan of Care Patient      Patient will benefit from skilled therapeutic intervention in order to improve the following deficits and impairments:  Difficulty walking, Pain, Postural dysfunction, Decreased strength, Decreased range of motion, Increased fascial restricitons  Visit Diagnosis: Acute left-sided low back pain with left-sided sciatica  Muscle spasm of back  Pain in left hip  Difficulty in walking, not elsewhere classified  Muscle weakness (generalized)       G-Codes - [id number]    Functional Assessment Tool Used FOTO   Functional Limitation Mobility: Walking and moving around   Mobility: Walking and Moving Around Goal Status [phone] At least 20 percent but less than 40 percent impaired, limited or restricted   Mobility: Walking and Moving Around Discharge Status [phone] At least 60 percent but less than 80 percent impaired, limited or restricted  70%      Problem List Patient Active Problem List   Diagnosis Date Noted  . Onychomycosis 10/13/2015  . 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 Abnormal EKG 02/05/2012  . Atrophic vaginitis   . Basal cell cancer 04/24/2011  . DIVERTICULOSIS, COLON  06/09/2009  . Hyperlipidemia 05/31/2008  . IRRITABLE BOWEL SYNDROME 05/27/2008  . Myalgia and myositis 05/27/2008  . Osteopenia 05/27/2008  . Chronic fatigue syndrome 05/27/2008   LVoncille Lo PT Exercise Expert for the Aging Adult  111-Dec-20171:58 PM Phone: [phone]: 3[facility]GC[address] [place] [address]Phone: [phone]  Fax:  [phone] Name: Heather Ramsey  PHYSICAL THERAPY DISCHARGE SUMMARY  Visits from Start of Care: 6  Current functional level related to goals / functional outcomes:  As above   Remaining deficits: As above, Pt cannot tolerate exercise and makes nerve pain worse.  Pain still with gentle decompression exercises   Education / Equipment: HEP     MRN: [id number] of Birth: 117-Apr-1947  Plan: Patient agrees to discharge.  Patient goals were not met. Patient is being discharged due to                                                     ????? returning to MD for further evaluation and pain control medicinally as deemed necessary by MD  LVoncille Lo PT Exercise Expert for the Aging Adult  111-Dec-20172:01 PM Phone: [phone]: [phone]

## 2016-08-15 ENCOUNTER — Encounter: Payer: Self-pay | Admitting: Rheumatology

## 2016-08-15 ENCOUNTER — Ambulatory Visit (INDEPENDENT_AMBULATORY_CARE_PROVIDER_SITE_OTHER): Payer: PPO | Admitting: Rheumatology

## 2016-08-15 VITALS — BP 131/77 | HR 65 | Resp 12 | Wt 127.0 lb

## 2016-08-15 DIAGNOSIS — M797 Fibromyalgia: Secondary | ICD-10-CM

## 2016-08-15 DIAGNOSIS — M5442 Lumbago with sciatica, left side: Secondary | ICD-10-CM

## 2016-08-15 DIAGNOSIS — G4700 Insomnia, unspecified: Secondary | ICD-10-CM | POA: Diagnosis not present

## 2016-08-15 DIAGNOSIS — R5383 Other fatigue: Secondary | ICD-10-CM

## 2016-08-15 MED ORDER — METHOCARBAMOL 500 MG PO TABS: 500.0000 mg | ORAL_TABLET | Freq: Two times a day (BID) | ORAL | 2 refills | Status: AC | PRN

## 2016-08-15 MED ORDER — DICLOFENAC SODIUM 1 % TD GEL: TRANSDERMAL | 3 refills | Status: DC

## 2016-08-15 MED ORDER — CYCLOBENZAPRINE HCL 10 MG PO TABS: 10.0000 mg | ORAL_TABLET | Freq: Every day | ORAL | 2 refills | Status: AC

## 2016-08-15 NOTE — Progress Notes (Signed)
Office Visit Note  Patient: Heather Ramsey             Date of Birth: May 19, 1946           MRN: [id number]             PCP: Binnie Rail, MD Referring: Binnie Rail, MD Visit Date: 08/15/2016 Occupation: @GUAROCC @    Subjective:  Pain of the Lower Back (Pain radiates to the hip) Low back pain with radiculopathy down the left side of leg to the left knee  History of Present Illness: Heather Ramsey is a 70 y.o. female  Last seen 07/12/2016. Please see note in [facility] for full details.  Patient is having sciatic type pain in the left part of her pelvis that radiates down the left leg to the posterior knee.  Patient was also seen by Dr. Durward Fortes for low back pain.   We sent her for physical therapy and she has had 6 sessions. Please see Epic for full details of those sessions. Patient actually has done worse after the sessions then she was prior to going for physical therapy. As a result her physical therapy released her recently.  When she saw Dr. Durward Fortes on 08/13/2016, he wants to have her do an MRI of her lower back. X-ray that was done in office showed arthritis according to the patient. I'll review those in a few minutes.  She has yet to be scheduled for the MRI of the lumbar back.  This past Friday, patient was in severe pain and so I called in some tramadol for her. I called him 20 pills. She was supposed to take 1 pill 3 times a day. She did well with the first 2 doses of medication but after the third dose she had GI upset nausea and she threw up. Therefore she discontinued the medication.  She is going back to taking her ibuprofen 1000 mg 2 1200 mg per day. She states that it's barely taking the edge off. In between her ibuprofen doses, she is taking Tylenol. She takes 4-5 pills of Tylenol per day and the pills that she has at home is extra strength which is equal to 5 mg per pill.  Her fibromyalgia discomfort is hard to rate because she's having so much back  pain at this time.  Patient rates her fibromyalgia discomfort is about 8 out of 10 but it is very hard for her to give an answer because she's having significant amount of low back pain.   Activities of Daily Living:  Patient reports morning stiffness for 30 minutes.   Patient Reports nocturnal pain.  Difficulty dressing/grooming: Reports Difficulty climbing stairs: Reports Difficulty getting out of chair: Reports Difficulty using hands for taps, buttons, cutlery, and/or writing: Denies   No Rheumatology ROS completed.   PMFS History:  Patient Active Problem List   Diagnosis Date Noted  . Onychomycosis 10/13/2015  . 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 Abnormal EKG 02/05/2012  . Atrophic vaginitis   . Basal cell cancer 04/24/2011  . DIVERTICULOSIS, COLON 06/09/2009  . Hyperlipidemia 05/31/2008  . IRRITABLE BOWEL SYNDROME 05/27/2008  . Myalgia and myositis 05/27/2008  . Osteopenia 05/27/2008  . Chronic fatigue syndrome 05/27/2008    Past Medical History:  Diagnosis Date  . Allergy   . Atrophic vaginitis   . Chest pain    negative  cardiac evaluation; Dr Percival Spanish  . Diverticulosis of colon 2005& 2010   FH colon cancer  . Fibromyalgia  Dr Estanislado Pandy  . GERD (gastroesophageal reflux disease)   . Heart murmur   . Hemorrhoid   . Hyperlipidemia   . IBS (irritable bowel syndrome)   . Migraine   . MVP (mitral valve prolapse)   . Osteopenia   . Pulmonary nodule 07/2006  . UTI (lower urinary tract infection) 2005   Citrobacter koseri, hospitalized w/ kidney infection 1983    Family History  Problem Relation Age of Onset  . Heart attack Father 39    Died with multiple medical problems  . Colon cancer Father 15  . Diabetes Father   . Tuberculosis Paternal Grandmother   . Stroke Mother 81  . Hypothyroidism Mother   . Hypertension Mother   . Hypothyroidism Sister   . Hypertension Sister   . Breast cancer Sister   . Diabetes Sister   . Heart attack  Sister     2 sisters > 29   Past Surgical History:  Procedure Laterality Date  . BREAST BIOPSY Left 2003  . [place]   with bladder tack   . COLONOSCOPY  07/2014   negative; Dr Olevia Perches  . FOOT SURGERY Left 2014   Social History   Social History Narrative   Regular exercise- yes: walks, but not as much due to knee arthritis.  Does stretching   Lives with husband.              Objective: Vital Signs: BP 131/77 (BP Location: Left Arm, Patient Position: Sitting, Cuff Size: Small)   Pulse 65   Resp 12   Wt 127 lb (57.6 kg)   BMI 23.61 kg/m    Physical Exam   Musculoskeletal Exam:  Full range of motion of all joints except decreased flexion because of back pain. Patient walks with discomfort. Grip strength is equal and strong bilaterally Fiber myalgia tender points are 18 out of 18 positive  CDAI Exam: No CDAI exam completed.  No synovitis  Investigation: No additional findings.   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 There is an anterior listhesis of L4 on 5 and a decreased disc space height between L5 and S1. Significant facet joint degenerative changes at L4-5 and L5-S1.  Xr Pelvis 1-2 Views  Result Date: 08/13/2016 Films of the pelvis reveal some calcifications along the periphery of both hips but the joint spaces are well maintained. There is some degenerative change at the pubic symphysis   Speciality Comments: No specialty comments available.    Procedures:  No procedures performed Allergies: Clarithromycin; Clindamycin; Levofloxacin; Tramadol; Atorvastatin; Bactroban [mupirocin calcium]; and Risedronate sodium   Assessment / Plan:     Visit Diagnoses: Acute left-sided low back pain with left-sided sciatica  Fibromyalgia  Fatigue, unspecified type  Insomnia, unspecified type   We are waiting on the MRI results of the lumbar spine  to see what problems are going on.  Stop the tramadol since patient has side effects from the tramadol and it made her faint as well as cause her heart palpitations and vomiting on the third pill that she took.  In its place, I will order methocarbamol for daytime muscle relaxer, Flexeril for evening muscle relaxer and sleep aid and she can use Voltaren gel as discussed for left SI joint pain/left back pain.   Orders: No orders of the defined types were placed in this encounter.  Meds ordered this encounter  Medications  .  cyclobenzaprine (FLEXERIL) 10 MG tablet    Sig: Take 1 tablet (10 mg total) by mouth at bedtime.    Dispense:  30 tablet    Refill:  2    Order Specific Question:   Supervising Provider    Answer:   Bo Merino [[id number]]  . methocarbamol (ROBAXIN) 500 MG tablet    Sig: Take 1 tablet (500 mg total) by mouth 2 (two) times daily as needed for muscle spasms.    Dispense:  60 tablet    Refill:  2    Order Specific Question:   Supervising Provider    Answer:   Bo Merino [[id number]]  . diclofenac sodium (VOLTAREN) 1 % GEL    Sig: Voltaren Gel 3 grams to 3 large joints upto TID 3 TUBES with 3 refills    Dispense:  3 Tube    Refill:  3    Voltaren Gel 3 grams to 3 large joints upto TID 3 TUBES with 3 refills    Order Specific Question:   Supervising Provider    Answer:   Lyda Perone    Face-to-face time spent with patient was 30 minutes. 50% of time was spent in counseling and coordination of care.  Follow-Up Instructions: Return in about 5 months (around 01/13/2017) for Increase back pain w/ left radiculopathy, fms, .   Eliezer Lofts, PA-C   I examined and evaluated the patient with Eliezer Lofts PA. The plan of care was discussed as noted above.  Bo Merino, MD

## 2016-08-21 ENCOUNTER — Telehealth (INDEPENDENT_AMBULATORY_CARE_PROVIDER_SITE_OTHER): Payer: Self-pay | Admitting: Orthopaedic Surgery

## 2016-08-21 ENCOUNTER — Ambulatory Visit: Payer: PPO | Admitting: Physical Therapy

## 2016-08-21 NOTE — Telephone Encounter (Signed)
Patient states the authorization has not been sent for an MRI. Patient is in a lot of pain and would like to get the MRI done soon.

## 2016-08-21 NOTE — Telephone Encounter (Signed)
Authorization pendng thru [facility]

## 2016-08-22 ENCOUNTER — Telehealth (INDEPENDENT_AMBULATORY_CARE_PROVIDER_SITE_OTHER): Payer: Self-pay | Admitting: Orthopaedic Surgery

## 2016-08-22 NOTE — Telephone Encounter (Signed)
Patient called again about MRI being scheduled and it should be filed under [employer] and patient was told it needed to be submitted as expedited in order to get it done soon. Thank you

## 2016-08-23 ENCOUNTER — Encounter: Payer: Self-pay | Admitting: Rheumatology

## 2016-08-23 NOTE — Telephone Encounter (Signed)
Patient called to ask about shots in her knees by Mr.Panwala. Says she is waiting for a call   Cb#: [phone]

## 2016-08-23 NOTE — Telephone Encounter (Signed)
Called pt back and advised her I have sent message to [employer] to please expedite her order and verbally understood.

## 2016-08-23 NOTE — Telephone Encounter (Signed)
Patient is requesting an earlier apt to go over her MRI so she can receive a cortizone shot, says she is in a lot of pain  Cb#: [phone]

## 2016-08-23 NOTE — Telephone Encounter (Signed)
Patient is calling about euflexxa shots. She would like to schedule for dec. 13,20,27  She cant not do it after the first week of jan because [place] of [place] rest of January   Cb#[phone]

## 2016-08-27 ENCOUNTER — Ambulatory Visit
Admission: RE | Admit: 2016-08-27 | Discharge: 2016-08-27 | Disposition: A | Discharge status: Home / Self Care | Payer: PPO | Source: Ambulatory Visit | Attending: Orthopaedic Surgery | Admitting: Orthopaedic Surgery

## 2016-08-27 DIAGNOSIS — G8929 Other chronic pain: Secondary | ICD-10-CM

## 2016-08-27 DIAGNOSIS — M5442 Lumbago with sciatica, left side: Principal | ICD-10-CM

## 2016-08-27 DIAGNOSIS — M48061 Spinal stenosis, lumbar region without neurogenic claudication: Secondary | ICD-10-CM | POA: Diagnosis not present

## 2016-08-28 ENCOUNTER — Encounter (INDEPENDENT_AMBULATORY_CARE_PROVIDER_SITE_OTHER): Payer: Self-pay

## 2016-08-28 ENCOUNTER — Ambulatory Visit (INDEPENDENT_AMBULATORY_CARE_PROVIDER_SITE_OTHER): Payer: PPO | Admitting: Orthopaedic Surgery

## 2016-08-28 VITALS — BP 175/90 | HR 68 | Ht 62.0 in | Wt 123.0 lb

## 2016-08-28 DIAGNOSIS — M5416 Radiculopathy, lumbar region: Secondary | ICD-10-CM

## 2016-08-28 MED ORDER — HYDROCODONE-ACETAMINOPHEN 5-325 MG PO TABS: 1.0000 | ORAL_TABLET | ORAL | 0 refills | Status: DC | PRN

## 2016-08-28 NOTE — Progress Notes (Signed)
Pt is here today to go over her MRI lumbar results.  800 mg ibuprofen every 4 hrs and 1000mg  Tylenol in between that.  Pt has trouble sleeping, unable can't get out of pain and get comfortable.

## 2016-08-28 NOTE — Progress Notes (Addendum)
Office Visit Note   Patient: Heather Ramsey           Date of Birth: 1945-10-20           MRN: [id number] Visit Date: 08/28/2016              Requested by: Binnie Rail, MD [place], [place] [address] PCP: Binnie Rail, MD   Assessment & Plan: Visit Diagnoses:  1. Lumbar radiculopathy     Plan:  #1: Consultation with Dr. Ernestina Patches for epidurals urgently #2: Hydrocodone for pain. #3: Instructed her not to use Tylenol with hydrocodone. #4: Instructed her to see her medical doctor for her elevated blood pressure and in the fact that she has been using such high doses of ibuprofen as well as acetaminophen #5: Follow back up in 3 weeks for recheck evaluation  Follow-Up Instructions: Return in about 3 weeks (around 09/18/2016).   Orders:  Orders Placed This Encounter  Procedures  . Ambulatory referral to Physical Medicine Rehab   Meds ordered this encounter  Medications  . HYDROcodone-acetaminophen (NORCO/VICODIN) 5-325 MG tablet    Sig: Take 1-2 tablets by mouth every 4 (four) hours as needed for moderate pain or severe pain.    Dispense:  45 tablet    Refill:  0    Order Specific Question:   Supervising Provider    Answer:   Garald Balding [id number]      Procedures: No procedures performed   Clinical Data: No additional findings.   Subjective: Chief Complaint  Patient presents with  . Lower Back - Pain, Numbness      HPI Heather Ramsey is a very pleasant 70 year old white female who is seen today for evaluation of an MRI scan (recently performed. She has been seen previously and placed physical therapy and unfortunately she continues to take 800 milligrams of ibuprofen every 4 hours and 1000 mg of Tylenol in between each. She states that it is starting to cause problems with her stomach.  She states she is having trouble sleeping and is unable to get out of pain despite using multiple medications.  Her symptoms were not improving and she is having  more low back pain and left leg buttock and ankle.he returns today for review of her MRI scan.   Review of Systems  Constitutional: Negative.   HENT: Negative.   Eyes: Negative.   Respiratory: Negative.   Cardiovascular: Negative.   Gastrointestinal: Positive for constipation.  Genitourinary: Negative.   Neurological: Positive for dizziness.     Objective: Vital Signs: BP (!) 175/90   Pulse 68   Ht 5\' 2"  (1.575 m)   Wt 123 lb (55.8 kg)   BMI 22.50 kg/m   Physical Exam  Constitutional: She is oriented to person, place, and time. She appears well-developed and well-nourished.  HENT:  Head: Normocephalic and atraumatic.  Eyes: EOM are normal. Pupils are equal, round, and reactive to light.  Neck:  No carotid bruits  Pulmonary/Chest: Effort normal.  Neurological: She is alert and oriented to person, place, and time.  Skin: Skin is warm and dry.  Psychiatric: She has a normal mood and affect. Her behavior is normal. Judgment and thought content normal.    Back Exam   Tenderness  The patient is experiencing tenderness in the lumbar.  Tests  Straight leg raise right: negative Straight leg raise left: positive at 90 deg  Other  Gait: antalgic       Specialty Comments:  No specialty comments available.  Imaging: EXAM: MRI LUMBAR SPINE WITHOUT CONTRAST  TECHNIQUE: Multiplanar, multisequence MR imaging of the lumbar spine was performed. No intravenous contrast was administered.  COMPARISON:  Lumbar spine radiographs 08/13/2016.  FINDINGS: Segmentation:  Standard.  Alignment: Facet mediated anterolisthesis of L4 on L5 measures 4 mm. Slight left convex lumbar spine curvature.  Vertebrae: Preserved vertebral body heights without evidence of fracture or suspicious osseous lesion. Minimal type 1 degenerative endplate changes at QA348G.  Conus medullaris: Extends to the T12-L1 level and appears normal.  Paraspinal and other soft tissues:  Unremarkable.  Disc levels:  Disc desiccation throughout the lumbar spine.  T11-12: Slight disc space narrowing and minimal disc bulging without stenosis.  T12-L1:  No disc herniation or stenosis.  L1-2:  No disc herniation or stenosis.  L2-3: Mild facet and ligamentum flavum hypertrophy without disc herniation or stenosis.  L3-4: Slight disc space narrowing. Mild disc bulging and mild facet and ligamentum flavum hypertrophy without stenosis.  L4-5: Mild disc space narrowing. Listhesis with bulging uncovered disc, ligamentum flavum thickening, and advanced facet arthrosis result in moderate spinal stenosis, left greater than right lateral recess stenosis, and minimal right and mild left neural foraminal stenosis. Potential left L5 nerve root impingement in the lateral recess. Moderate bilateral facet joint effusions.  L5-S1: Mild disc space narrowing. Disc bulging asymmetric to the left, ligamentum flavum thickening, moderate to severe facet arthrosis result in mild spinal stenosis and mild right and mild-to-moderate left neural foraminal stenosis. Moderate facet joint effusions.  IMPRESSION: 1. Advanced L4-5 facet arthrosis with grade 1 anterolisthesis, moderate spinal stenosis, and left greater than right lateral recess stenosis with potential left L5 nerve impingement. 2. Mild right and mild-to-moderate left neural foraminal stenosis at L5-S1 due to disc and facet degeneration.   Electronically Signed   By: Logan Bores M.D.   On: 08/27/2016 09:06  PMFS History: Patient Active Problem List   Diagnosis Date Noted  . Onychomycosis 10/13/2015  . 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 Abnormal EKG 02/05/2012  . Atrophic vaginitis   . Basal cell cancer 04/24/2011  . DIVERTICULOSIS, COLON 06/09/2009  . Hyperlipidemia 05/31/2008  . IRRITABLE BOWEL SYNDROME 05/27/2008  . Myalgia and myositis 05/27/2008  . Osteopenia 05/27/2008  . Chronic  fatigue syndrome 05/27/2008   Past Medical History:  Diagnosis Date  . Allergy   . Atrophic vaginitis   . Chest pain    negative  cardiac evaluation; Dr Percival Spanish  . Diverticulosis of colon 2005& 2010   FH colon cancer  . Fibromyalgia    Dr Estanislado Pandy  . GERD (gastroesophageal reflux disease)   . Heart murmur   . Hemorrhoid   . Hyperlipidemia   . IBS (irritable bowel syndrome)   . Migraine   . MVP (mitral valve prolapse)   . Osteopenia   . Pulmonary nodule 07/2006  . UTI (lower urinary tract infection) 2005   Citrobacter koseri, hospitalized w/ kidney infection 1983    Family History  Problem Relation Age of Onset  . Heart attack Father 39    Died with multiple medical problems  . Colon cancer Father 81  . Diabetes Father   . Tuberculosis Paternal Grandmother   . Stroke Mother 32  . Hypothyroidism Mother   . Hypertension Mother   . Hypothyroidism Sister   . Hypertension Sister   . Breast cancer Sister   . Diabetes Sister   . Heart attack Sister     2 sisters > 29  Past Surgical History:  Procedure Laterality Date  . BREAST BIOPSY Left 2003  . [place]   with bladder tack   . COLONOSCOPY  07/2014   negative; Dr Olevia Perches  . FOOT SURGERY Left 2014   Social History   Occupational History  . Not on file.   Social History Main Topics  . Smoking status: Never Smoker  . Smokeless tobacco: Never Used  . Alcohol use No  . Drug use: No  . Sexual activity: Yes    Birth control/ protection: Post-menopausal

## 2016-08-29 ENCOUNTER — Telehealth (INDEPENDENT_AMBULATORY_CARE_PROVIDER_SITE_OTHER): Payer: Self-pay | Admitting: Orthopaedic Surgery

## 2016-08-29 NOTE — Telephone Encounter (Signed)
Patient would like a handicap sticker, can we fill one out for that.

## 2016-08-30 NOTE — Telephone Encounter (Signed)
Please advise 

## 2016-08-30 NOTE — Telephone Encounter (Signed)
On your desk

## 2016-08-31 ENCOUNTER — Telehealth: Payer: Self-pay | Admitting: Internal Medicine

## 2016-08-31 NOTE — Telephone Encounter (Signed)
[place] [facility] - Client [facility]  Patient Name: Heather Ramsey  DOB: 1945/12/07    Initial Comment caller states her BP is 158/95   Nurse Assessment  Nurse: Wynetta Emery, RN, Baker Janus Date/Time Eilene Ghazi Time): 08/31/2016 10:23:39 AM  Confirm and document reason for call. If symptomatic, describe symptoms. ---Iraida is having spinal problems and to receive cortisone injections to spine; in meantime in pain and taking motrin for pain along with hydrocodone/act 5/325 at night blood pressure is elevated due to pain  Does the patient have any new or worsening symptoms? ---Yes  Will a triage be completed? ---Yes  Related visit to physician within the last 2 weeks? ---No  Does the PT have any chronic conditions? (i.e. diabetes, asthma, etc.) ---Yes  List chronic conditions. ---spinal problems  Is this a behavioral health or substance abuse call? ---No     Guidelines    Guideline Title Affirmed Question Affirmed Notes  High Blood Pressure [1] BP ? 140/90 AND [2] not taking BP medications    Final Disposition User   See PCP within [place], RN, Baker Janus    Referrals  REFERRED TO PCP OFFICE   Disagree/Comply: Leta Baptist

## 2016-09-04 ENCOUNTER — Encounter (INDEPENDENT_AMBULATORY_CARE_PROVIDER_SITE_OTHER): Payer: Self-pay | Admitting: Physical Medicine and Rehabilitation

## 2016-09-04 ENCOUNTER — Ambulatory Visit (INDEPENDENT_AMBULATORY_CARE_PROVIDER_SITE_OTHER): Payer: PPO | Admitting: Physical Medicine and Rehabilitation

## 2016-09-04 VITALS — BP 148/90 | Temp 98.2°F

## 2016-09-04 DIAGNOSIS — M5416 Radiculopathy, lumbar region: Secondary | ICD-10-CM | POA: Diagnosis not present

## 2016-09-04 DIAGNOSIS — M48062 Spinal stenosis, lumbar region with neurogenic claudication: Secondary | ICD-10-CM | POA: Insufficient documentation

## 2016-09-04 MED ORDER — LIDOCAINE HCL (PF) 1 % IJ SOLN: 0.3300 mL | Freq: Once | INTRAMUSCULAR | Status: DC

## 2016-09-04 MED ORDER — METHYLPREDNISOLONE ACETATE 80 MG/ML IJ SUSP
80.0000 mg | Freq: Once | INTRAMUSCULAR | Status: AC
  Administered 2016-09-04: 80 mg

## 2016-09-04 NOTE — Procedures (Signed)
Lumbar Epidural Steroid Injection - Interlaminar Approach with Fluoroscopic Guidance  Patient: Heather Ramsey      Date of Birth: 1946/08/25 MRN: [id number] PCP: Binnie Rail, MD      Visit Date: 09/04/2016   Universal Protocol:    Date/Time: 12/12/173:47 PM  Consent Given By: the patient  Position: PRONE  Additional Comments: Vital signs were monitored before and after the procedure. Patient was prepped and draped in the usual sterile fashion. The correct patient, procedure, and site was verified.   Injection Procedure Details:  Procedure Site One Meds Administered:  Meds ordered this encounter  Medications  . lidocaine (PF) (XYLOCAINE) 1 % injection 0.3 mL  . methylPREDNISolone acetate (DEPO-MEDROL) injection 80 mg     Laterality: Left  Location/Site:  L5-S1  Needle size: 20 G  Needle type: Tuohy  Needle Placement: Paramedian epidural  Findings:  -Contrast Used: 1 mL iohexol 180 mg iodine/mL   -Comments: Excellent flow of contrast into the epidural space.  Procedure Details: Using a paramedian approach from the side mentioned above, the region overlying the inferior lamina was localized under fluoroscopic visualization and the soft tissues overlying this structure were infiltrated with 4 ml. of 1% Lidocaine without Epinephrine. The Tuohy needle was inserted into the epidural space using a paramedian approach.   The epidural space was localized using loss of resistance along with lateral and bi-planar fluoroscopic views.  After negative aspirate for air, blood, and CSF, a 2 ml. volume of Isovue-250 was injected into the epidural space and the flow of contrast was observed. Radiographs were obtained for documentation purposes.    The injectate was administered into the level noted above.   Additional Comments:  The patient tolerated the procedure well Dressing: Band-Aid    Post-procedure details: Patient was observed during the procedure. Post-procedure  instructions were reviewed.  Patient left the clinic in stable condition.

## 2016-09-04 NOTE — Progress Notes (Signed)
[name] - 70 y.o. female MRN [id number]  Date of birth: November 16, 1945  Office Visit Note: Visit Date: 09/04/2016 PCP: Binnie Rail, MD Referred by: Binnie Rail, MD  Subjective: Chief Complaint  Patient presents with  . Lower Back - Pain   HPI: [name] is a 70 year old female history of osteoarthritis as well as fibromyalgia. She is followed by Dr. Durward Fortes Dr. Patrecia Pour. She has been having worsening low back pain since October. Worse on left side and radiating down leg to foot. Feels like cold water running down leg at times. Physical therapy made it worse. She has failed conservative care including medication management as well as physical therapy and time. Her pain was present even before October just worsening since that time. No trauma. MRI of the lumbar spine is reviewed below. This shows a grade 1 listhesis of L4 on L5 with arthritis as well as moderate stenosis and lateral recess stenosis on the left.     ROS Otherwise per HPI.  Assessment & Plan: Visit Diagnoses:  1. Spinal stenosis of lumbar region with neurogenic claudication   2. Lumbar radiculopathy     Plan: Findings:  Left L4-5 intralaminar epidural steroid injection versus L5-S1 depending on the fluoroscopic imaging. This is for central canal lateral recess stenosis worse on the left at L4-5 causing L5 radicular pain.    Meds & Orders:  Meds ordered this encounter  Medications  . lidocaine (PF) (XYLOCAINE) 1 % injection 0.3 mL  . methylPREDNISolone acetate (DEPO-MEDROL) injection 80 mg    Orders Placed This Encounter  Procedures  . Epidural Steroid injection    Follow-up: Return if symptoms worsen or fail to improve,2 weeks.   Procedures: No procedures performed  Lumbar Epidural Steroid Injection - Interlaminar Approach with Fluoroscopic Guidance  Patient: [name]      Date of Birth: 12-30-1945 MRN: [id number] PCP: Binnie Rail, MD      Visit Date: 09/04/2016   Universal  Protocol:    Date/Time: 12/12/173:47 PM  Consent Given By: the patient  Position: PRONE  Additional Comments: Vital signs were monitored before and after the procedure. Patient was prepped and draped in the usual sterile fashion. The correct patient, procedure, and site was verified.   Injection Procedure Details:  Procedure Site One Meds Administered:  Meds ordered this encounter  Medications  . lidocaine (PF) (XYLOCAINE) 1 % injection 0.3 mL  . methylPREDNISolone acetate (DEPO-MEDROL) injection 80 mg     Laterality: Left  Location/Site:  L5-S1  Needle size: 20 G  Needle type: Tuohy  Needle Placement: Paramedian epidural  Findings:  -Contrast Used: 1 mL iohexol 180 mg iodine/mL   -Comments: Excellent flow of contrast into the epidural space.  Procedure Details: Using a paramedian approach from the side mentioned above, the region overlying the inferior lamina was localized under fluoroscopic visualization and the soft tissues overlying this structure were infiltrated with 4 ml. of 1% Lidocaine without Epinephrine. The Tuohy needle was inserted into the epidural space using a paramedian approach.   The epidural space was localized using loss of resistance along with lateral and bi-planar fluoroscopic views.  After negative aspirate for air, blood, and CSF, a 2 ml. volume of Isovue-250 was injected into the epidural space and the flow of contrast was observed. Radiographs were obtained for documentation purposes.    The injectate was administered into the level noted above.   Additional Comments:  The patient tolerated the procedure well Dressing: Band-Aid  Post-procedure details: Patient was observed during the procedure. Post-procedure instructions were reviewed.  Patient left the clinic in stable condition.       Clinical History: Lumbar spine MRI12/12/2015   IMPRESSION: 1. Advanced L4-5 facet arthrosis with grade 1 anterolisthesis, moderate spinal  stenosis, and left greater than right lateral recess stenosis with potential left L5 nerve impingement. 2. Mild right and mild-to-moderate left neural foraminal stenosis at L5-S1 due to disc and facet degeneration.   She reports that she has never smoked. She has never used smokeless tobacco. No results for input(s): HGBA1C, LABURIC in the last 8760 hours.  Objective:  VS:  HT:    WT:   BMI:     BP:(!) 148/90  HR: bpm  TEMP:98.2 F (36.8 C)( )  RESP:  Physical Exam  Musculoskeletal:  She has good distal strength and ambulates without aid.    Ortho Exam Imaging: No results found.  Past Medical/Family/Surgical/Social History: Medications & Allergies reviewed per EMR Patient Active Problem List   Diagnosis Date Noted  . 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 Lumbar radiculopathy 09/04/2016  . Onychomycosis 10/13/2015  . 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 Abnormal EKG 02/05/2012  . Atrophic vaginitis   . Basal cell cancer 04/24/2011  . DIVERTICULOSIS, COLON 06/09/2009  . Hyperlipidemia 05/31/2008  . IRRITABLE BOWEL SYNDROME 05/27/2008  . Myalgia and myositis 05/27/2008  . Osteopenia 05/27/2008  . Chronic fatigue syndrome 05/27/2008   Past Medical History:  Diagnosis Date  . Allergy   . Atrophic vaginitis   . Chest pain    negative  cardiac evaluation; Dr Percival Spanish  . Diverticulosis of colon 2005& 2010   FH colon cancer  . Fibromyalgia    Dr Estanislado Pandy  . GERD (gastroesophageal reflux disease)   . Heart murmur   . Hemorrhoid   . Hyperlipidemia   . IBS (irritable bowel syndrome)   . Migraine   . MVP (mitral valve prolapse)   . Osteopenia   . Pulmonary nodule 07/2006  . UTI (lower urinary tract infection) 2005   Citrobacter koseri, hospitalized w/ kidney infection 1983   Family History  Problem Relation Age of Onset  . Heart attack Father 16    Died with multiple medical problems  . Colon cancer Father  103  . Diabetes Father   . Tuberculosis Paternal Grandmother   . Stroke Mother 84  . Hypothyroidism Mother   . Hypertension Mother   . Hypothyroidism Sister   . Hypertension Sister   . Breast cancer Sister   . Diabetes Sister   . Heart attack Sister     2 sisters > 82   Past Surgical History:  Procedure Laterality Date  . BREAST BIOPSY Left 2003  . [place]   with bladder tack   . COLONOSCOPY  07/2014   negative; Dr Olevia Perches  . FOOT SURGERY Left 2014   Social History   Occupational History  . Not on file.   Social History Main Topics  . Smoking status: Never Smoker  . Smokeless tobacco: Never Used  . Alcohol use No  . Drug use: No  . Sexual activity: Yes    Birth control/ protection: Post-menopausal

## 2016-09-04 NOTE — Patient Instructions (Signed)

## 2016-09-07 ENCOUNTER — Ambulatory Visit (INDEPENDENT_AMBULATORY_CARE_PROVIDER_SITE_OTHER): Payer: PPO | Admitting: Orthopaedic Surgery

## 2016-09-12 ENCOUNTER — Telehealth: Payer: Self-pay | Admitting: *Deleted

## 2016-09-12 NOTE — Telephone Encounter (Signed)
Patient sates she has been on Actonel for approximately 2 months. Patient states she is having severe heartburn. Patient has been consuming Tums and it helps to relieve the heartburn. Patient states she was on Evista in the past. What should she do regarding the side effects?

## 2016-09-12 NOTE — Telephone Encounter (Signed)
Unable to set a tickler in this system. Please remind me in January. Thanks!

## 2016-09-12 NOTE — Telephone Encounter (Signed)
I spoke to patient regarding the heartburn that she's been feeling.She is not sure if it's coming from Actonel or not.She does have a family history of heartburn.Her son has it and takes Prilosec with good relief.  I've asked the patient to take Prilosec at least for the next 2-3 weeks and see how she does.If she does well, she can discuss this with her PCP.We can reintroduce the Actonel, and see if her symptoms returnWe will refill her Actonel in January after she calls [place] and let us know that she is improving with her heartburn feeling.Set up a tickler in mid January to see if patient is ready to restart her Actonel. She takes it monthly; so we would give her 3 month supply with 1 refillWe have advised the patient to talk to her family physician regarding the heartburn.

## 2016-09-12 NOTE — Telephone Encounter (Signed)
OK THANK YOU

## 2016-09-25 ENCOUNTER — Telehealth (INDEPENDENT_AMBULATORY_CARE_PROVIDER_SITE_OTHER): Payer: Self-pay | Admitting: Physical Medicine and Rehabilitation

## 2016-09-25 NOTE — Telephone Encounter (Signed)
Tried to call pt back to discuss and got no answer. She had Lt L5-S1 interlaminar injection on 09/04/16. Scheduled for potential  repeat on 10/05/15 and has HTA and was to call me back to let me know % of relief she had with last injection and if she needed a repeat so I can request auth for another injection if needed.

## 2016-09-25 NOTE — Telephone Encounter (Signed)
Please call patient to go over insurance information regarding appt/shot on 10/04/16  Cb#: [phone]

## 2016-09-25 NOTE — Telephone Encounter (Signed)
Spoke with pt. She had 75-80% relief with last injection and then gradual increase since. Faxed auth for with last 2 office notes to [phone]. Pt is scheduled for injection on 10/05/15.

## 2016-10-04 ENCOUNTER — Ambulatory Visit (INDEPENDENT_AMBULATORY_CARE_PROVIDER_SITE_OTHER): Payer: PPO | Admitting: Physical Medicine and Rehabilitation

## 2016-10-04 ENCOUNTER — Encounter (INDEPENDENT_AMBULATORY_CARE_PROVIDER_SITE_OTHER): Payer: Self-pay | Admitting: Physical Medicine and Rehabilitation

## 2016-10-04 VITALS — BP 145/82 | HR 69 | Temp 98.0°F

## 2016-10-04 DIAGNOSIS — M5416 Radiculopathy, lumbar region: Secondary | ICD-10-CM

## 2016-10-04 MED ORDER — METHYLPREDNISOLONE ACETATE 80 MG/ML IJ SUSP
80.0000 mg | Freq: Once | INTRAMUSCULAR | Status: AC
  Administered 2016-10-04: 80 mg

## 2016-10-04 MED ORDER — LIDOCAINE HCL (PF) 1 % IJ SOLN: 0.3300 mL | Freq: Once | INTRAMUSCULAR | Status: DC

## 2016-10-04 NOTE — Patient Instructions (Signed)

## 2016-10-04 NOTE — Procedures (Signed)
Lumbar Epidural Steroid Injection - Interlaminar Approach with Fluoroscopic Guidance  Patient: Heather Ramsey      Date of Birth: 08/19/1946 MRN: [id number] PCP: Binnie Rail, MD      Visit Date: 10/04/2016   Universal Protocol:    Date/Time: 01/11/181:56 PM  Consent Given By: the patient  Position: PRONE  Additional Comments: Vital signs were monitored before and after the procedure. Patient was prepped and draped in the usual sterile fashion. The correct patient, procedure, and site was verified.   Injection Procedure Details:  Procedure Site One Meds Administered:  Meds ordered this encounter  Medications  . lidocaine (PF) (XYLOCAINE) 1 % injection 0.3 mL  . methylPREDNISolone acetate (DEPO-MEDROL) injection 80 mg     Laterality: Left  Location/Site:  L5-S1  Needle size: 20 G  Needle type: Tuohy  Needle Placement: Paramedian epidural  Findings:  -Contrast Used: 2 mL iohexol 180 mg iodine/mL   -Comments: Excellent flow of contrast into the epidural space.  Procedure Details: Using a paramedian approach from the side mentioned above, the region overlying the inferior lamina was localized under fluoroscopic visualization and the soft tissues overlying this structure were infiltrated with 4 ml. of 1% Lidocaine without Epinephrine. The Tuohy needle was inserted into the epidural space using a paramedian approach.   The epidural space was localized using loss of resistance along with lateral and bi-planar fluoroscopic views.  After negative aspirate for air, blood, and CSF, a 2 ml. volume of Isovue-250 was injected into the epidural space and the flow of contrast was observed. Radiographs were obtained for documentation purposes.    The injectate was administered into the level noted above.   Additional Comments:  The patient tolerated the procedure well Dressing: Band-Aid    Post-procedure details: Patient was observed during the procedure. Post-procedure  instructions were reviewed.  Patient left the clinic in stable condition.

## 2016-10-05 NOTE — Progress Notes (Signed)
Heather Ramsey - 71 y.o. female MRN [id number]  Date of birth: Oct 11, 1945  Office Visit Note: Visit Date: 10/04/2016 PCP: Binnie Rail, MD Referred by: Binnie Rail, MD  Subjective: No chief complaint on file.  HPI: [name] very pleasant 71 year old female who we completed an epidural injection at L5-S1 a few weeks ago. She states she got really probably more than 50% relief in the relief has actually stayed with her but this is not where she needs to be. She has moderate stenosis at L4-5 with facet arthropathy. She's having more left-sided radicular pain which is still present. We are going to repeat the injection today and see if we get a little bit more out of this.    ROS Otherwise per HPI.  Assessment & Plan: Visit Diagnoses:  1. Lumbar radiculopathy     Plan: Findings:  Plan today is for repeat L5-S1 intralaminar epidural steroid injection to the left. In the future might look at transforaminal approach versus facet joint blocks depending on her relief.    Meds & Orders:  Meds ordered this encounter  Medications  . lidocaine (PF) (XYLOCAINE) 1 % injection 0.3 mL  . methylPREDNISolone acetate (DEPO-MEDROL) injection 80 mg    Orders Placed This Encounter  Procedures  . Epidural Steroid injection    Follow-up: Return if symptoms worsen or fail to improve.   Procedures: No procedures performed  Lumbar Epidural Steroid Injection - Interlaminar Approach with Fluoroscopic Guidance  Patient: Heather Ramsey      Date of Birth: [birth year] MRN: [id number] PCP: Binnie Rail, MD      Visit Date: 10/04/2016   Universal Protocol:    Date/Time: 01/11/181:56 PM  Consent Given By: the patient  Position: PRONE  Additional Comments: Vital signs were monitored before and after the procedure. Patient was prepped and draped in the usual sterile fashion. The correct patient, procedure, and site was verified.   Injection Procedure Details:  Procedure Site  One Meds Administered:  Meds ordered this encounter  Medications  . lidocaine (PF) (XYLOCAINE) 1 % injection 0.3 mL  . methylPREDNISolone acetate (DEPO-MEDROL) injection 80 mg     Laterality: Left  Location/Site:  L5-S1  Needle size: 20 G  Needle type: Tuohy  Needle Placement: Paramedian epidural  Findings:  -Contrast Used: 2 mL iohexol 180 mg iodine/mL   -Comments: Excellent flow of contrast into the epidural space.  Procedure Details: Using a paramedian approach from the side mentioned above, the region overlying the inferior lamina was localized under fluoroscopic visualization and the soft tissues overlying this structure were infiltrated with 4 ml. of 1% Lidocaine without Epinephrine. The Tuohy needle was inserted into the epidural space using a paramedian approach.   The epidural space was localized using loss of resistance along with lateral and bi-planar fluoroscopic views.  After negative aspirate for air, blood, and CSF, a 2 ml. volume of Isovue-250 was injected into the epidural space and the flow of contrast was observed. Radiographs were obtained for documentation purposes.    The injectate was administered into the level noted above.   Additional Comments:  The patient tolerated the procedure well Dressing: Band-Aid    Post-procedure details: Patient was observed during the procedure. Post-procedure instructions were reviewed.  Patient left the clinic in stable condition.      Clinical History: Lumbar spine MRI12/12/2015   IMPRESSION: 1. Advanced L4-5 facet arthrosis with grade 1 anterolisthesis, moderate spinal stenosis, and left greater than right lateral recess stenosis  with potential left L5 nerve impingement. 2. Mild right and mild-to-moderate left neural foraminal stenosis at L5-S1 due to disc and facet degeneration.   She reports that she has never smoked. She has never used smokeless tobacco. No results for input(s): HGBA1C, LABURIC in the  last 8760 hours.  Objective:  VS:  HT:    WT:   BMI:     BP:(!) 145/82  HR:69bpm  TEMP:98 F (36.7 C)(Oral)  RESP:99 % Physical Exam  Musculoskeletal:  She has good distal strength and ambulates without aid.    Ortho Exam Imaging: No results found.  Past Medical/Family/Surgical/Social History: Medications & Allergies reviewed per EMR Patient Active Problem List   Diagnosis Date Noted  . 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 Lumbar radiculopathy 09/04/2016  . Onychomycosis 10/13/2015  . 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 Abnormal EKG 02/05/2012  . Atrophic vaginitis   . Basal cell cancer 04/24/2011  . DIVERTICULOSIS, COLON 06/09/2009  . Hyperlipidemia 05/31/2008  . IRRITABLE BOWEL SYNDROME 05/27/2008  . Myalgia and myositis 05/27/2008  . Osteopenia 05/27/2008  . Chronic fatigue syndrome 05/27/2008   Past Medical History:  Diagnosis Date  . Allergy   . Atrophic vaginitis   . Chest pain    negative  cardiac evaluation; Dr Percival Spanish  . Diverticulosis of colon 2005& 2010   FH colon cancer  . Fibromyalgia    Dr Estanislado Pandy  . GERD (gastroesophageal reflux disease)   . Heart murmur   . Hemorrhoid   . Hyperlipidemia   . IBS (irritable bowel syndrome)   . Migraine   . MVP (mitral valve prolapse)   . Osteopenia   . Pulmonary nodule 07/2006  . UTI (lower urinary tract infection) 2005   Citrobacter koseri, hospitalized w/ kidney infection 1983   Family History  Problem Relation Age of Onset  . Heart attack Father 79    Died with multiple medical problems  . Colon cancer Father 4  . Diabetes Father   . Tuberculosis Paternal Grandmother   . Stroke Mother 77  . Hypothyroidism Mother   . Hypertension Mother   . Hypothyroidism Sister   . Hypertension Sister   . Breast cancer Sister   . Diabetes Sister   . Heart attack Sister     2 sisters > 65   Past Surgical History:  Procedure Laterality Date  .  BREAST BIOPSY Left 2003  . [place]   with bladder tack   . COLONOSCOPY  07/2014   negative; Dr Olevia Perches  . FOOT SURGERY Left 2014   Social History   Occupational History  . Not on file.   Social History Main Topics  . Smoking status: Never Smoker  . Smokeless tobacco: Never Used  . Alcohol use No  . Drug use: No  . Sexual activity: Yes    Birth control/ protection: Post-menopausal

## 2016-10-09 NOTE — Telephone Encounter (Signed)
Approved per website. Auth#[id number]. Eff 09/25/16-12/24/16 for [id number].

## 2016-11-20 ENCOUNTER — Telehealth: Payer: Self-pay | Admitting: Rheumatology

## 2016-11-20 NOTE — Telephone Encounter (Signed)
-----   Message from Shona Needles, RT sent at 11/20/2016 10:35 AM EST ----- Regarding: RE: EUFLEXXA BIL JANUARY 2018 Please call patient and schedule Euflexxa inj. X 3 with Gilmer Mor on [place], Wednesday's or Friday's, Bilateral, buy and bill. Thank you.   ----- Message ----- From: Shona Needles, RT Sent: 08/07/2016   2:59 PM To: Shona Needles, RT Subject: Heather Ramsey 2018                      Okay to apply for Euflex of both knees 3 in January 2018. Once approved we can start her injections thereafter.

## 2016-11-20 NOTE — Telephone Encounter (Signed)
Left message on machine for patient to call back to schedule Euflexxa injections.  

## 2016-11-21 ENCOUNTER — Telehealth: Payer: Self-pay | Admitting: Rheumatology

## 2016-11-21 NOTE — Telephone Encounter (Signed)
Called patient to schedule Euflexxa injections and patient is currently trying to get her blood  Pressure under control with her PCP. She states she will call back to schedule Euflexxa injections once her blood pressure is under control.

## 2016-11-25 NOTE — Progress Notes (Signed)
Subjective:    Patient ID: Heather Ramsey, female    DOB: 01-27-1946, 71 y.o.   MRN: [id number]  HPI She is here for a physical exam.   She is dealing with lumbar stenosis and radiculopathy. She has daily pain and it limits her exercise.  She is following with orthopedics.  She has done PT and had injections.   She denies other changes in her history and has no concerns.   Medications and allergies reviewed with patient and updated if appropriate.  Patient Active Problem List   Diagnosis Date Noted  . 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 Lumbar radiculopathy 09/04/2016  . Onychomycosis 10/13/2015  . 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 Abnormal EKG 02/05/2012  . Atrophic vaginitis   . Basal cell cancer 04/24/2011  . DIVERTICULOSIS, COLON 06/09/2009  . Hyperlipidemia 05/31/2008  . IRRITABLE BOWEL SYNDROME 05/27/2008  . Myalgia and myositis 05/27/2008  . Osteopenia 05/27/2008  . Chronic fatigue syndrome 05/27/2008    Current Outpatient Prescriptions on File Prior to Visit  Medication Sig Dispense Refill  . b complex vitamins tablet Take 1 tablet by mouth daily.      . Calcium Carbonate-Vitamin D (CALCIUM + D PO) Take by mouth daily.    . Coenzyme Q10 (COQ10) 100 MG CAPS Take by mouth 3 (three) times daily.      . diclofenac sodium (VOLTAREN) 1 % GEL Voltaren Gel 3 grams to 3 large joints upto TID 3 TUBES with 3 refills 3 Tube 3  . fish oil-omega-3 fatty acids 1000 MG capsule Take 2 g by mouth daily.      . Ginger, Zingiber officinalis, (GINGER ROOT) 550 MG CAPS Take by mouth daily.      Marland Kitchen lidocaine (LIDODERM) 5 % Place 1 patch onto the skin as needed. Remove & Discard patch within 12 hours or as directed by MD    . magnesium 30 MG tablet Take 30 mg by mouth 2 (two) times daily.      . nitrofurantoin (MACRODANTIN) 50 MG capsule Take 50 mg by mouth as needed.    . Psyllium (METAMUCIL) 28.3 % POWD Take 1 scoop  dissolved in at least 8 ounces water/juice and drink twice daily 1 Bottle 0  . Red Yeast Rice Extract (RED YEAST RICE PO) Take by mouth.      . TURMERIC PO Take 400 mg by mouth daily.     Current Facility-Administered Medications on File Prior to Visit  Medication Dose Route Frequency Provider Last Rate Last Dose  . lidocaine (PF) (XYLOCAINE) 1 % injection 0.3 mL  0.3 mL Other Once Magnus Sinning, MD        Past Medical History:  Diagnosis Date  . Allergy   . Atrophic vaginitis   . Chest pain    negative  cardiac evaluation; Dr Percival Spanish  . Diverticulosis of colon 2005& 2010   FH colon cancer  . Fibromyalgia    Dr Estanislado Pandy  . GERD (gastroesophageal reflux disease)   . Heart murmur   . Hemorrhoid   . Hyperlipidemia   . IBS (irritable bowel syndrome)   . Migraine   . MVP (mitral valve prolapse)   . Osteopenia   . Pulmonary nodule 07/2006  . UTI (lower urinary tract infection) 2005   Citrobacter koseri, hospitalized w/ kidney infection 1983    Past Surgical History:  Procedure Laterality Date  . BREAST BIOPSY Left 2003  . [place]  with bladder tack   . COLONOSCOPY  07/2014   negative; Dr Olevia Perches  . FOOT SURGERY Left 2014    Social History   Social History  . Marital status: Married    Spouse name: N/A  . Number of children: 2  . Years of education: N/A   Social History Main Topics  . Smoking status: Never Smoker  . Smokeless tobacco: Never Used  . Alcohol use No  . Drug use: No  . Sexual activity: Yes    Birth control/ protection: Post-menopausal   Other Topics Concern  . None   Social History Narrative   Regular exercise- yes: walks, but not as much due to knee arthritis.  Does stretching   Lives with husband.             Family History  Problem Relation Age of Onset  . Heart attack Father 2    Died with multiple medical problems  . Colon cancer Father 29  . Diabetes Father   . Tuberculosis Paternal Grandmother   . Stroke  Mother 66  . Hypothyroidism Mother   . Hypertension Mother   . Hypothyroidism Sister   . Hypertension Sister   . Breast cancer Sister   . Diabetes Sister   . Heart attack Sister     2 sisters > 56    Review of Systems  Constitutional: Negative for chills and fever.  Eyes: Negative for visual disturbance.  Respiratory: Negative for cough, shortness of breath and wheezing.   Cardiovascular: Negative for chest pain, palpitations and leg swelling.  Gastrointestinal: Positive for anal bleeding (hemorrhoids) and constipation (metamucil). Negative for abdominal pain, blood in stool, diarrhea and nausea.       No gerd  Genitourinary: Negative for dysuria and hematuria.  Musculoskeletal: Positive for arthralgias, back pain and myalgias.  Skin: Negative for color change and rash.  Neurological: Negative for dizziness, light-headedness and headaches.  Psychiatric/Behavioral: Negative for dysphoric mood. The patient is not nervous/anxious.        Objective:   Vitals:   11/26/16 0830  BP: (!) 152/100  Pulse: 70  Resp: 16  Temp: 97.7 F (36.5 C)   Filed Weights   11/26/16 0830  Weight: 127 lb (57.6 kg)   Body mass index is 23.23 kg/m.  Wt Readings from Last 3 Encounters:  11/26/16 127 lb (57.6 kg)  08/28/16 123 lb (55.8 kg)  08/15/16 127 lb (57.6 kg)     Physical Exam Constitutional: She appears well-developed and well-nourished. No distress.  HENT:  Head: Normocephalic and atraumatic.  Right Ear: External ear normal. Normal ear canal and TM Left Ear: External ear normal.  Normal ear canal and TM Mouth/Throat: Oropharynx is clear and moist.  Eyes: Conjunctivae and EOM are normal.  Neck: Neck supple. No tracheal deviation present. No thyromegaly present.  No carotid bruit  Cardiovascular: Normal rate, regular rhythm and normal heart sounds.   2/6 systolic murmur heard.  No edema. Pulmonary/Chest: Effort normal and breath sounds normal. No respiratory distress. She has no  wheezes. She has no rales.  Breast: deferred to Gyn Abdominal: Soft. She exhibits no distension. There is no tenderness.  Lymphadenopathy: She has no cervical adenopathy.  Skin: Skin is warm and dry. She is not diaphoretic.  Psychiatric: She has a normal mood and affect. Her behavior is normal.         Assessment & Plan:   Physical exam: Screening blood work   ordered Immunizations   Up to date  Colonoscopy  Up to date  Mammogram   Up to date  Gyn    Up to date  Dexa Up to date - 2016 - done by gyn Eye exams  Up to date  EKG  Last done 2013  - needs EKG today -- discussed getting a stress test and she deferred - having no concerning symptoms Exercise  - some exercise - walking Weight   normal BMI Skin   No concerns Substance abuse   none  See Problem List for Assessment and Plan of chronic medical problems.

## 2016-11-25 NOTE — Patient Instructions (Addendum)
Test(s) ordered today. Your results will be released to [place] (or called to you) after review, usually within 72hours after test completion. If any changes need to be made, you will be notified at that same time.  All other Health Maintenance issues reviewed.   All recommended immunizations and age-appropriate screenings are up-to-date or discussed.  No immunizations administered today.   An EKG was done today  Medications reviewed and updated.  Changes include starting losartan 25 mg daily.  Monitor your BP at home - your BP goal is < 130/80.  Please followup in 4-6 weeks   Health Maintenance, Female Adopting a healthy lifestyle and getting preventive care can go a long way to promote health and wellness. Talk with your health care provider about what schedule of regular examinations is right for you. This is a good chance for you to check in with your provider about disease prevention and staying healthy. In between checkups, there are plenty of things you can do on your own. Experts have done a lot of research about which lifestyle changes and preventive measures are most likely to keep you healthy. Ask your health care provider for more information. Weight and diet Eat a healthy diet  Be sure to include plenty of vegetables, fruits, low-fat dairy products, and lean protein.  Do not eat a lot of foods high in solid fats, added sugars, or salt.  Get regular exercise. This is one of the most important things you can do for your health.  Most adults should exercise for at least 150 minutes each week. The exercise should increase your heart rate and make you sweat (moderate-intensity exercise).  Most adults should also do strengthening exercises at least twice a week. This is in addition to the moderate-intensity exercise. Maintain a healthy weight  Body mass index (BMI) is a measurement that can be used to identify possible weight problems. It estimates body fat based on height and  weight. Your health care provider can help determine your BMI and help you achieve or maintain a healthy weight.  For females 46 years of age and older:  A BMI below 18.5 is considered underweight.  A BMI of 18.5 to 24.9 is normal.  A BMI of 25 to 29.9 is considered overweight.  A BMI of 30 and above is considered obese. Watch levels of cholesterol and blood lipids  You should start having your blood tested for lipids and cholesterol at 70 years of age, then have this test every 5 years.  You may need to have your cholesterol levels checked more often if:  Your lipid or cholesterol levels are high.  You are older than 70 years of age.  You are at high risk for heart disease. Cancer screening Lung Cancer  Lung cancer screening is recommended for adults 16-57 years old who are at high risk for lung cancer because of a history of smoking.  A yearly low-dose CT scan of the lungs is recommended for people who:  Currently smoke.  Have quit within the past 15 years.  Have at least a 30-pack-year history of smoking. A pack year is smoking an average of one pack of cigarettes a day for 1 year.  Yearly screening should continue until it has been 15 years since you quit.  Yearly screening should stop if you develop a health problem that would prevent you from having lung cancer treatment. Breast Cancer  Practice breast self-awareness. This means understanding how your breasts normally appear and feel.  It  also means doing regular breast self-exams. Let your health care provider know about any changes, no matter how small.  If you are in your 20s or 30s, you should have a clinical breast exam (CBE) by a health care provider every 1-3 years as part of a regular health exam.  If you are 70 or older, have a CBE every year. Also consider having a breast X-ray (mammogram) every year.  If you have a family history of breast cancer, talk to your health care provider about genetic  screening.  If you are at high risk for breast cancer, talk to your health care provider about having an MRI and a mammogram every year.  Breast cancer gene (BRCA) assessment is recommended for women who have family members with BRCA-related cancers. BRCA-related cancers include:  Breast.  Ovarian.  Tubal.  Peritoneal cancers.  Results of the assessment will determine the need for genetic counseling and BRCA1 and BRCA2 testing. Cervical Cancer  Your health care provider may recommend that you be screened regularly for cancer of the pelvic organs (ovaries, uterus, and vagina). This screening involves a pelvic examination, including checking for microscopic changes to the surface of your cervix (Pap test). You may be encouraged to have this screening done every 3 years, beginning at age 16.  For women ages 68-65, health care providers may recommend pelvic exams and Pap testing every 3 years, or they may recommend the Pap and pelvic exam, combined with testing for human papilloma virus (HPV), every 5 years. Some types of HPV increase your risk of cervical cancer. Testing for HPV may also be done on women of any age with unclear Pap test results.  Other health care providers may not recommend any screening for nonpregnant women who are considered low risk for pelvic cancer and who do not have symptoms. Ask your health care provider if a screening pelvic exam is right for you.  If you have had past treatment for cervical cancer or a condition that could lead to cancer, you need Pap tests and screening for cancer for at least 20 years after your treatment. If Pap tests have been discontinued, your risk factors (such as having a new sexual partner) need to be reassessed to determine if screening should resume. Some women have medical problems that increase the chance of getting cervical cancer. In these cases, your health care provider may recommend more frequent screening and Pap tests. Colorectal  Cancer  This type of cancer can be detected and often prevented.  Routine colorectal cancer screening usually begins at 70 years of age and continues through 70 years of age.  Your health care provider may recommend screening at an earlier age if you have risk factors for colon cancer.  Your health care provider may also recommend using home test kits to check for hidden blood in the stool.  A small camera at the end of a tube can be used to examine your colon directly (sigmoidoscopy or colonoscopy). This is done to check for the earliest forms of colorectal cancer.  Routine screening usually begins at age 35.  Direct examination of the colon should be repeated every 5-10 years through 70 years of age. However, you may need to be screened more often if early forms of precancerous polyps or small growths are found. Skin Cancer  Check your skin from head to toe regularly.  Tell your health care provider about any new moles or changes in moles, especially if there is a change in a  mole's shape or color.  Also tell your health care provider if you have a mole that is larger than the size of a pencil eraser.  Always use sunscreen. Apply sunscreen liberally and repeatedly throughout the day.  Protect yourself by wearing long sleeves, pants, a wide-brimmed hat, and sunglasses whenever you are outside. Heart disease, diabetes, and high blood pressure  High blood pressure causes heart disease and increases the risk of stroke. High blood pressure is more likely to develop in:  People who have blood pressure in the high end of the normal range (130-139/85-89 mm Hg).  People who are overweight or obese.  People who are African American.  If you are 34-26 years of age, have your blood pressure checked every 3-5 years. If you are 76 years of age or older, have your blood pressure checked every year. You should have your blood pressure measured twice-once when you are at a hospital or clinic,  and once when you are not at a hospital or clinic. Record the average of the two measurements. To check your blood pressure when you are not at a hospital or clinic, you can use:  An automated blood pressure machine at a pharmacy.  A home blood pressure monitor.  If you are between 58 years and 75 years old, ask your health care provider if you should take aspirin to prevent strokes.  Have regular diabetes screenings. This involves taking a blood sample to check your fasting blood sugar level.  If you are at a normal weight and have a low risk for diabetes, have this test once every three years after 70 years of age.  If you are overweight and have a high risk for diabetes, consider being tested at a younger age or more often. Preventing infection Hepatitis B  If you have a higher risk for hepatitis B, you should be screened for this virus. You are considered at high risk for hepatitis B if:  You were born in a country where hepatitis B is common. Ask your health care provider which countries are considered high risk.  Your parents were born in a high-risk country, and you have not been immunized against hepatitis B (hepatitis B vaccine).  You have HIV or AIDS.  You use needles to inject street drugs.  You live with someone who has hepatitis B.  You have had sex with someone who has hepatitis B.  You get hemodialysis treatment.  You take certain medicines for conditions, including cancer, organ transplantation, and autoimmune conditions. Hepatitis C  Blood testing is recommended for:  Everyone born from [birth year] through 1964.  Anyone with known risk factors for hepatitis C. Sexually transmitted infections (STIs)  You should be screened for sexually transmitted infections (STIs) including gonorrhea and chlamydia if:  You are sexually active and are younger than 70 years of age.  You are older than 70 years of age and your health care provider tells you that you are at risk  for this type of infection.  Your sexual activity has changed since you were last screened and you are at an increased risk for chlamydia or gonorrhea. Ask your health care provider if you are at risk.  If you do not have HIV, but are at risk, it may be recommended that you take a prescription medicine daily to prevent HIV infection. This is called pre-exposure prophylaxis (PrEP). You are considered at risk if:  You are sexually active and do not regularly use condoms or know the HIV  status of your partner(s).  You take drugs by injection.  You are sexually active with a partner who has HIV. Talk with your health care provider about whether you are at high risk of being infected with HIV. If you choose to begin PrEP, you should first be tested for HIV. You should then be tested every 3 months for as long as you are taking PrEP. Pregnancy  If you are premenopausal and you may become pregnant, ask your health care provider about preconception counseling.  If you may become pregnant, take 400 to 800 micrograms (mcg) of folic acid every day.  If you want to prevent pregnancy, talk to your health care provider about birth control (contraception). Osteoporosis and menopause  Osteoporosis is a disease in which the bones lose minerals and strength with aging. This can result in serious bone fractures. Your risk for osteoporosis can be identified using a bone density scan.  If you are 63 years of age or older, or if you are at risk for osteoporosis and fractures, ask your health care provider if you should be screened.  Ask your health care provider whether you should take a calcium or vitamin D supplement to lower your risk for osteoporosis.  Menopause may have certain physical symptoms and risks.  Hormone replacement therapy may reduce some of these symptoms and risks. Talk to your health care provider about whether hormone replacement therapy is right for you. Follow these instructions at  home:  Schedule regular health, dental, and eye exams.  Stay current with your immunizations.  Do not use any tobacco products including cigarettes, chewing tobacco, or electronic cigarettes.  If you are pregnant, do not drink alcohol.  If you are breastfeeding, limit how much and how often you drink alcohol.  Limit alcohol intake to no more than 1 drink per day for nonpregnant women. One drink equals 12 ounces of beer, 5 ounces of wine, or 1 ounces of hard liquor.  Do not use street drugs.  Do not share needles.  Ask your health care provider for help if you need support or information about quitting drugs.  Tell your health care provider if you often feel depressed.  Tell your health care provider if you have ever been abused or do not feel safe at home. This information is not intended to replace advice given to you by your health care provider. Make sure you discuss any questions you have with your health care provider. Document Released: 03/26/2011 Document Revised: 02/16/2016 Document Reviewed: 06/14/2015 Elsevier Interactive Patient Education  2017 [employer].

## 2016-11-26 ENCOUNTER — Encounter: Payer: Self-pay | Admitting: Internal Medicine

## 2016-11-26 ENCOUNTER — Ambulatory Visit (INDEPENDENT_AMBULATORY_CARE_PROVIDER_SITE_OTHER): Payer: PPO | Admitting: Internal Medicine

## 2016-11-26 ENCOUNTER — Other Ambulatory Visit (INDEPENDENT_AMBULATORY_CARE_PROVIDER_SITE_OTHER): Payer: PPO

## 2016-11-26 VITALS — BP 152/104 | HR 70 | Temp 97.7°F | Resp 16 | Ht 62.0 in | Wt 127.0 lb

## 2016-11-26 DIAGNOSIS — Z0000 Encounter for general adult medical examination without abnormal findings: Secondary | ICD-10-CM | POA: Diagnosis not present

## 2016-11-26 DIAGNOSIS — R9431 Abnormal electrocardiogram [ECG] [EKG]: Secondary | ICD-10-CM

## 2016-11-26 DIAGNOSIS — M85861 Other specified disorders of bone density and structure, right lower leg: Secondary | ICD-10-CM | POA: Diagnosis not present

## 2016-11-26 DIAGNOSIS — E7800 Pure hypercholesterolemia, unspecified: Secondary | ICD-10-CM

## 2016-11-26 DIAGNOSIS — M48062 Spinal stenosis, lumbar region with neurogenic claudication: Secondary | ICD-10-CM

## 2016-11-26 LAB — CBC WITH DIFFERENTIAL/PLATELET
Basophils Absolute: 0.1 10*3/uL (ref 0.0–0.1)
Basophils Relative: 1 % (ref 0.0–3.0)
Eosinophils Absolute: 0.1 10*3/uL (ref 0.0–0.7)
Eosinophils Relative: 1.4 % (ref 0.0–5.0)
HCT: 42.6 % (ref 36.0–46.0)
Hemoglobin: 14.4 g/dL (ref 12.0–15.0)
Lymphocytes Relative: 24.6 % (ref 12.0–46.0)
Lymphs Abs: 1.4 10*3/uL (ref 0.7–4.0)
MCHC: 33.8 g/dL (ref 30.0–36.0)
MCV: 91.5 fl (ref 78.0–100.0)
Monocytes Absolute: 0.4 10*3/uL (ref 0.1–1.0)
Monocytes Relative: 7.2 % (ref 3.0–12.0)
Neutro Abs: 3.8 10*3/uL (ref 1.4–7.7)
Neutrophils Relative %: 65.8 % (ref 43.0–77.0)
Platelets: 272 10*3/uL (ref 150.0–400.0)
RBC: 4.66 Mil/uL (ref 3.87–5.11)
RDW: 12.8 % (ref 11.5–15.5)
WBC: 5.7 10*3/uL (ref 4.0–10.5)

## 2016-11-26 LAB — COMPREHENSIVE METABOLIC PANEL WITH GFR
ALT: 13 U/L (ref 0–35)
AST: 16 U/L (ref 0–37)
Albumin: 4.4 g/dL (ref 3.5–5.2)
Alkaline Phosphatase: 46 U/L (ref 39–117)
BUN: 15 mg/dL (ref 6–23)
CO2: 28 meq/L (ref 19–32)
Calcium: 9.7 mg/dL (ref 8.4–10.5)
Chloride: 103 meq/L (ref 96–112)
Creatinine, Ser: 0.74 mg/dL (ref 0.40–1.20)
GFR: 82.43 mL/min
Glucose, Bld: 90 mg/dL (ref 70–99)
Potassium: 4.7 meq/L (ref 3.5–5.1)
Sodium: 139 meq/L (ref 135–145)
Total Bilirubin: 0.5 mg/dL (ref 0.2–1.2)
Total Protein: 7 g/dL (ref 6.0–8.3)

## 2016-11-26 LAB — LIPID PANEL
Cholesterol: 273 mg/dL — ABNORMAL HIGH (ref 0–200)
HDL: 80.9 mg/dL
LDL Cholesterol: 178 mg/dL — ABNORMAL HIGH (ref 0–99)
NonHDL: 191.93
Total CHOL/HDL Ratio: 3
Triglycerides: 71 mg/dL (ref 0.0–149.0)
VLDL: 14.2 mg/dL (ref 0.0–40.0)

## 2016-11-26 LAB — TSH: TSH: 3.17 u[IU]/mL (ref 0.35–4.50)

## 2016-11-26 MED ORDER — LOSARTAN POTASSIUM 25 MG PO TABS: 25.0000 mg | ORAL_TABLET | Freq: Every day | ORAL | 5 refills | Status: DC

## 2016-11-26 NOTE — Assessment & Plan Note (Signed)
EKG today Dr Percival Spanish recommended considering another stress test - due to risk factors and family history -  discussed with her - she deferred at this time No concerning symptoms - will consider stress test in near future depending on symtpoms

## 2016-11-26 NOTE — Progress Notes (Signed)
Pre visit review using our clinic review tool, if applicable. No additional management support is needed unless otherwise documented below in the visit note. 

## 2016-11-26 NOTE — Assessment & Plan Note (Signed)
Did not tolerate lipitor Check cmp, lipid panel Continue exercising

## 2016-11-26 NOTE — Assessment & Plan Note (Signed)
Did not tolerate actonel dexa up to date, but due this year - has been ordered by gyn Taking calcium and vitamin D Tries to walk for exercise

## 2016-12-06 ENCOUNTER — Telehealth (INDEPENDENT_AMBULATORY_CARE_PROVIDER_SITE_OTHER): Payer: Self-pay | Admitting: Physical Medicine and Rehabilitation

## 2016-12-06 NOTE — Telephone Encounter (Signed)
[facility]- needs auth for [id number].

## 2016-12-06 NOTE — Telephone Encounter (Signed)
Ok to to do on left

## 2016-12-06 NOTE — Telephone Encounter (Signed)
Faxed auth form to [facility]

## 2016-12-10 ENCOUNTER — Encounter: Payer: Self-pay | Admitting: Internal Medicine

## 2016-12-12 MED ORDER — LOSARTAN POTASSIUM 50 MG PO TABS: 50.0000 mg | ORAL_TABLET | Freq: Every day | ORAL | 3 refills | Status: DC

## 2016-12-13 NOTE — Telephone Encounter (Signed)
Still pending per website 

## 2016-12-14 NOTE — Telephone Encounter (Signed)
Still pending per website 

## 2016-12-17 NOTE — Telephone Encounter (Signed)
Still pending as of this morning.

## 2016-12-17 NOTE — Telephone Encounter (Signed)
Tried to call pt to go ahead and schedule and got no answer

## 2016-12-18 NOTE — Telephone Encounter (Signed)
auth received. [id number]. eff 12/06/16-03/06/17. Called pt and scheduled her for 12/20/16 @ 1:00.

## 2016-12-20 ENCOUNTER — Encounter (INDEPENDENT_AMBULATORY_CARE_PROVIDER_SITE_OTHER): Payer: Self-pay | Admitting: Physical Medicine and Rehabilitation

## 2016-12-20 ENCOUNTER — Ambulatory Visit (INDEPENDENT_AMBULATORY_CARE_PROVIDER_SITE_OTHER): Payer: Self-pay

## 2016-12-20 ENCOUNTER — Ambulatory Visit (INDEPENDENT_AMBULATORY_CARE_PROVIDER_SITE_OTHER): Payer: PPO | Admitting: Physical Medicine and Rehabilitation

## 2016-12-20 VITALS — BP 144/84 | HR 78 | Temp 98.2°F

## 2016-12-20 DIAGNOSIS — M5416 Radiculopathy, lumbar region: Secondary | ICD-10-CM | POA: Diagnosis not present

## 2016-12-20 MED ORDER — HYDROCODONE-ACETAMINOPHEN 5-325 MG PO TABS: 1.0000 | ORAL_TABLET | Freq: Three times a day (TID) | ORAL | 0 refills | Status: DC | PRN

## 2016-12-20 MED ORDER — METHYLPREDNISOLONE ACETATE 80 MG/ML IJ SUSP
80.0000 mg | Freq: Once | INTRAMUSCULAR | Status: AC
  Administered 2016-12-20: 80 mg

## 2016-12-20 MED ORDER — LIDOCAINE HCL (PF) 1 % IJ SOLN
0.3300 mL | Freq: Once | INTRAMUSCULAR | Status: AC
  Administered 2016-12-20: 0.3 mL

## 2016-12-20 NOTE — Progress Notes (Signed)
Heather Ramsey - 71 y.o. female MRN [id number]  Date of birth: 09-28-1945  Office Visit Note: Visit Date: 12/20/2016 PCP: Binnie Rail, MD Referred by: Binnie Rail, MD  Subjective: Chief Complaint  Patient presents with  . Lower Back - Pain   HPI: Heather Ramsey is a very pleasant 71 year old female but very anxious. She is having severe chronic worsening pain across lower back and worse on right now as of about 1 month. Radiates down back of right leg to ankle and a somewhat L5 distribution. On left side only feels it in her buttock but nothing radiating down the leg. Denies numbness and tingling. States she can't get any relief. She has been using some hydrocodone that was provided by [facility].  She reports trip to [place] really seems to have flared everything up. Her pain was worse on the left but now is worse on the right with radicular complaints. MRI is reviewed again below. She has spoken with Dr. Patrecia Pour and they have put in a referral to see Dr. Ellene Route for neurosurgical evaluation.    ROS Otherwise per HPI.  Assessment & Plan: Visit Diagnoses:  1. Lumbar radiculopathy     Plan: Findings:  Right L4-5 intralaminar epidural steroid injection. Consider transforaminal approach versus facet joint injection diagnostically depending on her consultation with Dr. Ellene Route. Continue to emphasize neutral spine core strengthening.    Meds & Orders:  Meds ordered this encounter  Medications  . lidocaine (PF) (XYLOCAINE) 1 % injection 0.3 mL  . methylPREDNISolone acetate (DEPO-MEDROL) injection 80 mg  . HYDROcodone-acetaminophen (NORCO/VICODIN) 5-325 MG tablet    Sig: Take 1 tablet by mouth every 8 (eight) hours as needed for moderate pain.    Dispense:  40 tablet    Refill:  0    Orders Placed This Encounter  Procedures  . XR C-ARM NO REPORT  . Epidural Steroid injection    Follow-up: Return if symptoms worsen or fail to improve.   Procedures: No procedures  performed  Lumbar Epidural Steroid Injection - Interlaminar Approach with Fluoroscopic Guidance  Patient: Heather Ramsey      Date of Birth: February 23, 1946 MRN: [id number] PCP: Binnie Rail, MD      Visit Date: 12/20/2016   Universal Protocol:    Date/Time: 03/30/187:02 AM  Consent Given By: the patient  Position: PRONE  Additional Comments: Vital signs were monitored before and after the procedure. Patient was prepped and draped in the usual sterile fashion. The correct patient, procedure, and site was verified.   Injection Procedure Details:  Procedure Site One Meds Administered:  Meds ordered this encounter  Medications  . lidocaine (PF) (XYLOCAINE) 1 % injection 0.3 mL  . methylPREDNISolone acetate (DEPO-MEDROL) injection 80 mg  . HYDROcodone-acetaminophen (NORCO/VICODIN) 5-325 MG tablet    Sig: Take 1 tablet by mouth every 8 (eight) hours as needed for moderate pain.    Dispense:  40 tablet    Refill:  0     Laterality: Right  Location/Site:  L4-L5  Needle size: 20 G  Needle type: Tuohy  Needle Placement: Paramedian epidural  Findings:  -Contrast Used: 1 mL iohexol 180 mg iodine/mL   -Comments: Excellent flow of contrast into the epidural space.  Procedure Details: Using a paramedian approach from the side mentioned above, the region overlying the inferior lamina was localized under fluoroscopic visualization and the soft tissues overlying this structure were infiltrated with 4 ml. of 1% Lidocaine without Epinephrine. The Tuohy needle was inserted  into the epidural space using a paramedian approach.   The epidural space was localized using loss of resistance along with lateral and bi-planar fluoroscopic views.  After negative aspirate for air, blood, and CSF, a 2 ml. volume of Isovue-250 was injected into the epidural space and the flow of contrast was observed. Radiographs were obtained for documentation purposes.    The injectate was administered into the  level noted above.   Additional Comments:  The patient tolerated the procedure well Dressing: Band-Aid    Post-procedure details: Patient was observed during the procedure. Post-procedure instructions were reviewed.  Patient left the clinic in stable condition.    Clinical History: Lumbar spine MRI12/12/2015   IMPRESSION: 1. Advanced L4-5 facet arthrosis with grade 1 anterolisthesis, moderate spinal stenosis, and left greater than right lateral recess stenosis with potential left L5 nerve impingement. 2. Mild right and mild-to-moderate left neural foraminal stenosis at L5-S1 due to disc and facet degeneration.   She reports that she has never smoked. She has never used smokeless tobacco. No results for input(s): HGBA1C, LABURIC in the last 8760 hours.  Objective:  VS:  HT:    WT:   BMI:     BP:(!) 144/84  HR:78bpm  TEMP:98.2 F (36.8 C)( )  RESP:96 % Physical Exam  Musculoskeletal:  Patient ambulates without aid with a slightly forward flexed spine. She has good distal strength.    Ortho Exam Imaging: Xr C-arm No Report  Result Date: 12/20/2016 Please see Notes or Procedures tab for imaging impression.   Past Medical/Family/Surgical/Social History: Medications & Allergies reviewed per EMR Patient Active Problem List   Diagnosis Date Noted  . 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 Lumbar radiculopathy 09/04/2016  . Onychomycosis 10/13/2015  . 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 Abnormal EKG 02/05/2012  . Atrophic vaginitis   . Basal cell cancer 04/24/2011  . DIVERTICULOSIS, COLON 06/09/2009  . Hyperlipidemia 05/31/2008  . IRRITABLE BOWEL SYNDROME 05/27/2008  . Myalgia and myositis 05/27/2008  . Osteopenia 05/27/2008  . Chronic fatigue syndrome 05/27/2008   Past Medical History:  Diagnosis Date  . Allergy   . Atrophic vaginitis   . Chest pain    negative  cardiac evaluation; Dr Percival Spanish  .  Diverticulosis of colon 2005& 2010   FH colon cancer  . Fibromyalgia    Dr Estanislado Pandy  . GERD (gastroesophageal reflux disease)   . Heart murmur   . Hemorrhoid   . Hyperlipidemia   . IBS (irritable bowel syndrome)   . Migraine   . MVP (mitral valve prolapse)   . Osteopenia   . Pulmonary nodule 07/2006  . UTI (lower urinary tract infection) 2005   Citrobacter koseri, hospitalized w/ kidney infection 1983   Family History  Problem Relation Age of Onset  . Heart attack Father 71    Died with multiple medical problems  . Colon cancer Father 14  . Diabetes Father   . Tuberculosis Paternal Grandmother   . Stroke Mother 23  . Hypothyroidism Mother   . Hypertension Mother   . Hypothyroidism Sister   . Hypertension Sister   . Breast cancer Sister   . Diabetes Sister   . Heart attack Sister     2 sisters > 81   Past Surgical History:  Procedure Laterality Date  . BREAST BIOPSY Left 2003  . [place]   with bladder tack   . COLONOSCOPY  07/2014   negative; Dr Olevia Perches  .  FOOT SURGERY Left 2014   Social History   Occupational History  . Not on file.   Social History Main Topics  . Smoking status: Never Smoker  . Smokeless tobacco: Never Used  . Alcohol use No  . Drug use: No  . Sexual activity: Yes    Birth control/ protection: Post-menopausal

## 2016-12-20 NOTE — Patient Instructions (Signed)

## 2016-12-21 NOTE — Procedures (Signed)
Lumbar Epidural Steroid Injection - Interlaminar Approach with Fluoroscopic Guidance  Patient: Heather Ramsey      Date of Birth: 1946-02-02 MRN: [id number] PCP: Binnie Rail, MD      Visit Date: 12/20/2016   Universal Protocol:    Date/Time: 03/30/187:02 AM  Consent Given By: the patient  Position: PRONE  Additional Comments: Vital signs were monitored before and after the procedure. Patient was prepped and draped in the usual sterile fashion. The correct patient, procedure, and site was verified.   Injection Procedure Details:  Procedure Site One Meds Administered:  Meds ordered this encounter  Medications  . lidocaine (PF) (XYLOCAINE) 1 % injection 0.3 mL  . methylPREDNISolone acetate (DEPO-MEDROL) injection 80 mg  . HYDROcodone-acetaminophen (NORCO/VICODIN) 5-325 MG tablet    Sig: Take 1 tablet by mouth every 8 (eight) hours as needed for moderate pain.    Dispense:  40 tablet    Refill:  0     Laterality: Right  Location/Site:  L4-L5  Needle size: 20 G  Needle type: Tuohy  Needle Placement: Paramedian epidural  Findings:  -Contrast Used: 1 mL iohexol 180 mg iodine/mL   -Comments: Excellent flow of contrast into the epidural space.  Procedure Details: Using a paramedian approach from the side mentioned above, the region overlying the inferior lamina was localized under fluoroscopic visualization and the soft tissues overlying this structure were infiltrated with 4 ml. of 1% Lidocaine without Epinephrine. The Tuohy needle was inserted into the epidural space using a paramedian approach.   The epidural space was localized using loss of resistance along with lateral and bi-planar fluoroscopic views.  After negative aspirate for air, blood, and CSF, a 2 ml. volume of Isovue-250 was injected into the epidural space and the flow of contrast was observed. Radiographs were obtained for documentation purposes.    The injectate was administered into the level noted  above.   Additional Comments:  The patient tolerated the procedure well Dressing: Band-Aid    Post-procedure details: Patient was observed during the procedure. Post-procedure instructions were reviewed.  Patient left the clinic in stable condition.

## 2016-12-25 ENCOUNTER — Telehealth (INDEPENDENT_AMBULATORY_CARE_PROVIDER_SITE_OTHER): Payer: Self-pay | Admitting: Physical Medicine and Rehabilitation

## 2016-12-25 NOTE — Telephone Encounter (Signed)
[facility] auth for bilateral [id number]. Patient is aware we will call her when approved and that it could take up to 14 business days for approval.

## 2016-12-25 NOTE — Telephone Encounter (Signed)
Bilateral L5 trans esi two weeks from last injection. She is only 5 days out, could still help. Ask about valium pre-procedure

## 2016-12-26 ENCOUNTER — Telehealth (INDEPENDENT_AMBULATORY_CARE_PROVIDER_SITE_OTHER): Payer: Self-pay | Admitting: Orthopaedic Surgery

## 2016-12-26 NOTE — Telephone Encounter (Signed)
COPY OF RECORDS & XRAY CD MAILED TO DR. Ellene Route PER PTS SIGNED RELEASE

## 2016-12-29 DIAGNOSIS — I10 Essential (primary) hypertension: Secondary | ICD-10-CM | POA: Insufficient documentation

## 2016-12-29 NOTE — Patient Instructions (Addendum)
    Medications reviewed and updated.  Changes include starting amlodipine 5 mg daily for your blood pressure.    Your prescription(s) have been submitted to your pharmacy. Please take as directed and contact our office if you believe you are having problem(s) with the medication(s).    Please followup in 1 month

## 2016-12-29 NOTE — Progress Notes (Signed)
Subjective:    Patient ID: Heather Ramsey, female    DOB: 07-08-1946, 71 y.o.   MRN: [id number]  HPI The patient is here for follow up of hypertension.  We started losartan 25 mg about 5 weeks ago.  We increased the dose to 50 mg a few weeks ago because her BP was still elevated.  She was doing some walking for exercise, but that has decreased due to increased back pain with radiculopathy.  Her BP still elevated at home - similar to what we got here today.  She had some dry mouth and constipation from the medication, but otherwise tolerated it well.    She is taking motrin daily for her back pain.     Medications and allergies reviewed with patient and updated if appropriate.  Patient Active Problem List   Diagnosis Date Noted  . Hypertension 12/29/2016  . 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 Lumbar radiculopathy 09/04/2016  . Onychomycosis 10/13/2015  . 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 Abnormal EKG 02/05/2012  . Atrophic vaginitis   . Basal cell cancer 04/24/2011  . DIVERTICULOSIS, COLON 06/09/2009  . Hyperlipidemia 05/31/2008  . IRRITABLE BOWEL SYNDROME 05/27/2008  . Myalgia and myositis 05/27/2008  . Osteopenia 05/27/2008  . Chronic fatigue syndrome 05/27/2008    Current Outpatient Prescriptions on File Prior to Visit  Medication Sig Dispense Refill  . b complex vitamins tablet Take 1 tablet by mouth daily.      . Calcium Carbonate-Vitamin D (CALCIUM + D PO) Take by mouth daily.    . Coenzyme Q10 (COQ10) 100 MG CAPS Take by mouth 3 (three) times daily.      . cyclobenzaprine (FLEXERIL) 10 MG tablet Take 10 mg by mouth at bedtime.    . diclofenac sodium (VOLTAREN) 1 % GEL Voltaren Gel 3 grams to 3 large joints upto TID 3 TUBES with 3 refills 3 Tube 3  . fish oil-omega-3 fatty acids 1000 MG capsule Take 2 g by mouth daily.      . Ginger, Zingiber officinalis, (GINGER ROOT) 550 MG CAPS Take by mouth daily.       Marland Kitchen HYDROcodone-acetaminophen (NORCO/VICODIN) 5-325 MG tablet Take 1 tablet by mouth every 8 (eight) hours as needed for moderate pain. 40 tablet 0  . lidocaine (LIDODERM) 5 % Place 1 patch onto the skin as needed. Remove & Discard patch within 12 hours or as directed by MD    . losartan (COZAAR) 50 MG tablet Take 1 tablet (50 mg total) by mouth daily. 90 tablet 3  . magnesium 30 MG tablet Take 30 mg by mouth 2 (two) times daily.      . nitrofurantoin (MACRODANTIN) 50 MG capsule Take 50 mg by mouth as needed.    . Psyllium (METAMUCIL) 28.3 % POWD Take 1 scoop dissolved in at least 8 ounces water/juice and drink twice daily 1 Bottle 0  . Red Yeast Rice Extract (RED YEAST RICE PO) Take by mouth.      . TURMERIC PO Take 400 mg by mouth daily.     No current facility-administered medications on file prior to visit.     Past Medical History:  Diagnosis Date  . Allergy   . Atrophic vaginitis   . Chest pain    negative  cardiac evaluation; Dr Percival Spanish  . Diverticulosis of colon 2005& 2010   FH colon cancer  . Fibromyalgia    Dr Estanislado Pandy  . GERD (gastroesophageal reflux disease)   .  Heart murmur   . Hemorrhoid   . Hyperlipidemia   . IBS (irritable bowel syndrome)   . Migraine   . MVP (mitral valve prolapse)   . Osteopenia   . Pulmonary nodule 07/2006  . UTI (lower urinary tract infection) 2005   Citrobacter koseri, hospitalized w/ kidney infection 1983    Past Surgical History:  Procedure Laterality Date  . BREAST BIOPSY Left 2003  . [place]   with bladder tack   . COLONOSCOPY  07/2014   negative; Dr Olevia Perches  . FOOT SURGERY Left 2014    Social History   Social History  . Marital status: Married    Spouse name: N/A  . Number of children: 2  . Years of education: N/A   Social History Main Topics  . Smoking status: Never Smoker  . Smokeless tobacco: Never Used  . Alcohol use No  . Drug use: No  . Sexual activity: Yes    Birth control/ protection:  Post-menopausal   Other Topics Concern  . Not on file   Social History Narrative   Regular exercise- yes: walks, but not as much due to knee arthritis.  Does stretching   Lives with husband.             Family History  Problem Relation Age of Onset  . Heart attack Father 47    Died with multiple medical problems  . Colon cancer Father 35  . Diabetes Father   . Tuberculosis Paternal Grandmother   . Stroke Mother 35  . Hypothyroidism Mother   . Hypertension Mother   . Hypothyroidism Sister   . Hypertension Sister   . Breast cancer Sister   . Diabetes Sister   . Heart attack Sister     2 sisters > 65    Review of Systems  Respiratory: Negative for shortness of breath.   Cardiovascular: Negative for chest pain, palpitations and leg swelling.  Neurological: Negative for light-headedness and headaches.       Objective:   Vitals:   12/31/16 0944  BP: (!) 158/92  Pulse: (!) 59  Resp: 16  Temp: 97.7 F (36.5 C)   Wt Readings from Last 3 Encounters:  12/31/16 129 lb (58.5 kg)  11/26/16 127 lb (57.6 kg)  08/28/16 123 lb (55.8 kg)   Body mass index is 23.59 kg/m.   Physical Exam    Constitutional: Appears well-developed and well-nourished. No distress.  HENT:  Head: Normocephalic and atraumatic.  Neck: Neck supple. No tracheal deviation present. No thyromegaly present.  No cervical lymphadenopathy Cardiovascular: Normal rate, regular rhythm and normal heart sounds.   2/6 systolic murmur heard. No carotid bruit .  No edema Pulmonary/Chest: Effort normal and breath sounds normal. No respiratory distress. No has no wheezes. No rales.  Skin: Skin is warm and dry. Not diaphoretic.  Psychiatric: Normal mood and affect. Behavior is normal.      Assessment & Plan:    See Problem List for Assessment and Plan of chronic medical problems.

## 2016-12-31 ENCOUNTER — Ambulatory Visit (INDEPENDENT_AMBULATORY_CARE_PROVIDER_SITE_OTHER): Payer: PPO | Admitting: Internal Medicine

## 2016-12-31 ENCOUNTER — Encounter: Payer: Self-pay | Admitting: Internal Medicine

## 2016-12-31 DIAGNOSIS — I10 Essential (primary) hypertension: Secondary | ICD-10-CM | POA: Diagnosis not present

## 2016-12-31 MED ORDER — AMLODIPINE BESYLATE 5 MG PO TABS: 5.0000 mg | ORAL_TABLET | Freq: Every day | ORAL | 5 refills | Status: DC

## 2016-12-31 NOTE — Assessment & Plan Note (Signed)
Still elevated continue losartan 50 mg daily Start amlodipine 5 mg daily She will update via mychart f/u with me in 1 month Will check bmp at next visit

## 2016-12-31 NOTE — Progress Notes (Signed)
Pre visit review using our clinic review tool, if applicable. No additional management support is needed unless otherwise documented below in the visit note. 

## 2017-01-03 DIAGNOSIS — M19041 Primary osteoarthritis, right hand: Secondary | ICD-10-CM | POA: Insufficient documentation

## 2017-01-03 DIAGNOSIS — M797 Fibromyalgia: Secondary | ICD-10-CM | POA: Insufficient documentation

## 2017-01-03 DIAGNOSIS — F5101 Primary insomnia: Secondary | ICD-10-CM | POA: Insufficient documentation

## 2017-01-03 DIAGNOSIS — M7061 Trochanteric bursitis, right hip: Secondary | ICD-10-CM | POA: Insufficient documentation

## 2017-01-03 DIAGNOSIS — M7062 Trochanteric bursitis, left hip: Secondary | ICD-10-CM

## 2017-01-03 DIAGNOSIS — M19042 Primary osteoarthritis, left hand: Secondary | ICD-10-CM

## 2017-01-03 DIAGNOSIS — R5383 Other fatigue: Secondary | ICD-10-CM | POA: Insufficient documentation

## 2017-01-03 NOTE — Progress Notes (Signed)
Office Visit Note  Patient: Heather Ramsey             Date of Birth: Aug 14, 1946           MRN: [id number]             PCP: Binnie Rail, MD Referring: Binnie Rail, MD Visit Date: 01/17/2017 Occupation: @GUAROCC @    Subjective:  Lower back pain   History of Present Illness: Heather Ramsey is a 71 y.o. female with history of fibromyalgia osteoarthritis and disc disease. She continues to have lower back pain. She has had for epidural injections in the last 5 months. She saw Dr. Deatra Robinson yesterday after evaluation he felt that she will benefit from lumbar spine fusion. She is scheduled to have epidural injection today. She complains of bilateral arm pain. She states she reached back  with her left arm and heard a pop in her left arm and now notices a lump now.  Activities of Daily Living:  Patient reports morning stiffness for 10 minutes.   Patient Denies nocturnal pain.  Difficulty dressing/grooming: Denies Difficulty climbing stairs: Reports Difficulty getting out of chair: Denies Difficulty using hands for taps, buttons, cutlery, and/or writing: Denies   Review of Systems  Constitutional: Positive for fatigue. Negative for night sweats, weight gain, weight loss and weakness.  HENT: Positive for mouth dryness. Negative for mouth sores, trouble swallowing, trouble swallowing and nose dryness.   Eyes: Positive for dryness. Negative for pain, redness and visual disturbance.  Respiratory: Negative for cough, shortness of breath and difficulty breathing.   Cardiovascular: Positive for hypertension. Negative for chest pain, palpitations, irregular heartbeat and swelling in legs/feet.  Gastrointestinal: Positive for constipation. Negative for blood in stool and diarrhea.  Endocrine: Negative for increased urination.  Genitourinary: Negative for vaginal dryness.  Musculoskeletal: Positive for arthralgias, joint pain, myalgias, morning stiffness and myalgias. Negative for joint  swelling, muscle weakness and muscle tenderness.  Skin: Negative for color change, rash, hair loss, skin tightness, ulcers and sensitivity to sunlight.  Allergic/Immunologic: Negative for susceptible to infections.  Neurological: Negative for dizziness, memory loss and night sweats.  Hematological: Negative for swollen glands.  Psychiatric/Behavioral: Positive for sleep disturbance. Negative for depressed mood. The patient is not nervous/anxious.     PMFS History:  Patient Active Problem List   Diagnosis Date Noted  . Fibromyalgia 01/03/2017  . Primary insomnia 01/03/2017  . Other fatigue 01/03/2017  . Primary osteoarthritis of both hands 01/03/2017  . Trochanteric bursitis of both hips 01/03/2017  . Hypertension 12/29/2016  . 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 Lumbar radiculopathy 09/04/2016  . Onychomycosis 10/13/2015  . 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 Abnormal EKG 02/05/2012  . Atrophic vaginitis   . Basal cell cancer 04/24/2011  . DIVERTICULOSIS, COLON 06/09/2009  . Hyperlipidemia 05/31/2008  . IRRITABLE BOWEL SYNDROME 05/27/2008  . Myalgia and myositis 05/27/2008  . Osteopenia 05/27/2008  . Chronic fatigue syndrome 05/27/2008    Past Medical History:  Diagnosis Date  . Allergy   . Atrophic vaginitis   . Chest pain    negative  cardiac evaluation; Dr Percival Spanish  . Diverticulosis of colon 2005& 2010   FH colon cancer  . Fibromyalgia    Dr Estanislado Pandy  . GERD (gastroesophageal reflux disease)   . Heart murmur   . Hemorrhoid   . Hyperlipidemia   . IBS (irritable bowel syndrome)   . Migraine   . MVP (mitral valve prolapse)   .  Osteopenia   . Pulmonary nodule 07/2006  . UTI (lower urinary tract infection) 2005   Citrobacter koseri, hospitalized w/ kidney infection 1983    Family History  Problem Relation Age of Onset  . Heart attack Father 28    Died with multiple medical problems  . Colon cancer Father  49  . Diabetes Father   . Tuberculosis Paternal Grandmother   . Stroke Mother 49  . Hypothyroidism Mother   . Hypertension Mother   . Hypothyroidism Sister   . Hypertension Sister   . Breast cancer Sister   . Diabetes Sister   . Heart attack Sister     2 sisters > 38   Past Surgical History:  Procedure Laterality Date  . BREAST BIOPSY Left 2003  . [place]   with bladder tack   . COLONOSCOPY  07/2014   negative; Dr Olevia Perches  . FOOT SURGERY Left 2014   Social History   Social History Narrative   Regular exercise- yes: walks, but not as much due to knee arthritis.  Does stretching   Lives with husband.              Objective: Vital Signs: BP 118/71 (BP Location: Left Arm, Patient Position: Sitting, Cuff Size: Normal)   Pulse 87   Resp 13   Ht 5' 1.5" (1.562 m)   Wt 127 lb (57.6 kg)   BMI 23.61 kg/m    Physical Exam  Constitutional: She is oriented to person, place, and time. She appears well-developed and well-nourished.  HENT:  Head: Normocephalic and atraumatic.  Eyes: Conjunctivae and EOM are normal.  Neck: Normal range of motion.  Cardiovascular: Normal rate, regular rhythm and intact distal pulses.   Murmur heard. Pulmonary/Chest: Effort normal and breath sounds normal.  Abdominal: Soft. Bowel sounds are normal.  Lymphadenopathy:    She has no cervical adenopathy.  Neurological: She is alert and oriented to person, place, and time.  Skin: Skin is warm and dry. Capillary refill takes less than 2 seconds.  Psychiatric: She has a normal mood and affect. Her behavior is normal.  Nursing note and vitals reviewed.    Musculoskeletal Exam: C-spine and thoracic spine good range of motion. She has limited painful range of motion of her lumbar spine. Shoulder joints elbow joints wrist joint MCPs PIPs DIPs with good range of motion. She has some DIP thickening consistent with osteoarthritis. Hip joints are good range of motion. She had tenderness over  bilateral trochanteric bursa area consistent with trochanteric bursitis. Knee joints ankles MTPs PIPs with good range of motion. With no synovitis. Fibromyalgia tender points with 12 out of 18 positive.  CDAI Exam: No CDAI exam completed.    Investigation: Findings:  We reviewed her labs from January 2010 that showed CBC with diff normal, BMP normal, and CK at 181.  Her labs from March 2010 showed CBC with normal, sed rate normal at 2, CMP normal except for glucose at 100, and CK mildly elevated at 183. TSH, ANA, SPEP and CCP were all within normal limits.  Vitamin D was at 39.   Patient got labs from her PCPs office today March 15,018 CMP normal, CBC normal, LDL 178, TSH normal  CBC    Component Value Date/Time   WBC 5.7 11/26/2016 0954   RBC 4.66 11/26/2016 0954   HGB 14.4 11/26/2016 0954   HCT 42.6 11/26/2016 0954   PLT 272.0 11/26/2016 0954   MCV 91.5 11/26/2016 0954   MCHC 33.8  11/26/2016 0954   RDW 12.8 11/26/2016 0954   LYMPHSABS 1.4 11/26/2016 0954   MONOABS 0.4 11/26/2016 0954   EOSABS 0.1 11/26/2016 0954   BASOSABS 0.1 11/26/2016 0954   CMP     Component Value Date/Time   NA 139 11/26/2016 0954   K 4.7 11/26/2016 0954   CL 103 11/26/2016 0954   CO2 28 11/26/2016 0954   GLUCOSE 90 11/26/2016 0954   BUN 15 11/26/2016 0954   CREATININE 0.74 11/26/2016 0954   CALCIUM 9.7 11/26/2016 0954   PROT 7.0 11/26/2016 0954   ALBUMIN 4.4 11/26/2016 0954   AST 16 11/26/2016 0954   ALT 13 11/26/2016 0954   ALKPHOS 46 11/26/2016 0954   BILITOT 0.5 11/26/2016 0954   GFRNONAA 77.08 06/09/2009 0000   GFRAA  10/06/2008 1145    >60        The eGFR has been calculated using the MDRD equation. This calculation has not been validated in all clinical situations. eGFR's persistently <60 mL/min signify possible Chronic Kidney Disease.    Imaging: Xr C-arm No Report  Result Date: 12/20/2016 Please see Notes or Procedures tab for imaging impression.   Speciality Comments:  No specialty comments available.    Procedures:  No procedures performed Allergies: Clarithromycin; Clindamycin; Levofloxacin; Tramadol; Actonel [risedronate sodium]; Atorvastatin; Bactroban [mupirocin calcium]; and Risedronate sodium   Assessment / Plan:     Visit Diagnoses: Fibromyalgia: She has generalized pain and discomfort and myalgias but overall she believes her fibromyalgia is fairly well controlled.  Primary insomnia: Due to trochanteric bursitis and lower back pain  Other fatigue: Due to underlying insomnia  Primary osteoarthritis of both hands: Joint protection and muscle strengthening discussed.  Spinal stenosis of lumbar region with neurogenic claudication: She is getting epidural injection today. She may consider lumbar spine fusion near future.  Trochanteric bursitis of both hips: ITB and exercise were demonstrated.  Biceps tender rupture on left: She has some bruising and some tenderness. We discussed possible surgery in future although it is not necessary.  Osteopenia of multiple sites: She is aware of taking calcium and vitamin D.  History of hypertension  History of IBS  History of basal cell cancer  PVNS (pigmented villonodular synovitis)  History of diverticulosis    Orders: No orders of the defined types were placed in this encounter.  No orders of the defined types were placed in this encounter.   Face-to-face time spent with patient was 30 minutes. 50% of time was spent in counseling and coordination of care.  Follow-Up Instructions: Return in about 1 year (around 01/17/2018) for FMS, OA,  DDD.   Bo Merino, MD  Note - This record has been created using Editor, commissioning.  Chart creation errors have been sought, but may not always  have been located. Such creation errors do not reflect on  the standard of medical care.

## 2017-01-08 ENCOUNTER — Encounter (INDEPENDENT_AMBULATORY_CARE_PROVIDER_SITE_OTHER): Payer: PPO | Admitting: Physical Medicine and Rehabilitation

## 2017-01-10 ENCOUNTER — Encounter: Payer: Self-pay | Admitting: Internal Medicine

## 2017-01-14 ENCOUNTER — Ambulatory Visit: Payer: PPO | Admitting: Rheumatology

## 2017-01-16 DIAGNOSIS — M4316 Spondylolisthesis, lumbar region: Secondary | ICD-10-CM | POA: Diagnosis not present

## 2017-01-17 ENCOUNTER — Ambulatory Visit (INDEPENDENT_AMBULATORY_CARE_PROVIDER_SITE_OTHER): Payer: PPO | Admitting: Physical Medicine and Rehabilitation

## 2017-01-17 ENCOUNTER — Encounter: Payer: Self-pay | Admitting: Rheumatology

## 2017-01-17 ENCOUNTER — Encounter (INDEPENDENT_AMBULATORY_CARE_PROVIDER_SITE_OTHER): Payer: Self-pay | Admitting: Physical Medicine and Rehabilitation

## 2017-01-17 ENCOUNTER — Ambulatory Visit (INDEPENDENT_AMBULATORY_CARE_PROVIDER_SITE_OTHER): Payer: PPO | Admitting: Rheumatology

## 2017-01-17 ENCOUNTER — Ambulatory Visit (INDEPENDENT_AMBULATORY_CARE_PROVIDER_SITE_OTHER): Payer: PPO

## 2017-01-17 VITALS — BP 118/71 | HR 87 | Resp 13 | Ht 61.5 in | Wt 127.0 lb

## 2017-01-17 VITALS — BP 111/73 | HR 77

## 2017-01-17 DIAGNOSIS — M48062 Spinal stenosis, lumbar region with neurogenic claudication: Secondary | ICD-10-CM

## 2017-01-17 DIAGNOSIS — M7061 Trochanteric bursitis, right hip: Secondary | ICD-10-CM | POA: Diagnosis not present

## 2017-01-17 DIAGNOSIS — M7062 Trochanteric bursitis, left hip: Secondary | ICD-10-CM

## 2017-01-17 DIAGNOSIS — Z8719 Personal history of other diseases of the digestive system: Secondary | ICD-10-CM

## 2017-01-17 DIAGNOSIS — S46212A Strain of muscle, fascia and tendon of other parts of biceps, left arm, initial encounter: Secondary | ICD-10-CM

## 2017-01-17 DIAGNOSIS — M19041 Primary osteoarthritis, right hand: Secondary | ICD-10-CM | POA: Diagnosis not present

## 2017-01-17 DIAGNOSIS — M1220 Villonodular synovitis (pigmented), unspecified site: Secondary | ICD-10-CM | POA: Diagnosis not present

## 2017-01-17 DIAGNOSIS — F5101 Primary insomnia: Secondary | ICD-10-CM

## 2017-01-17 DIAGNOSIS — M8589 Other specified disorders of bone density and structure, multiple sites: Secondary | ICD-10-CM | POA: Diagnosis not present

## 2017-01-17 DIAGNOSIS — M19042 Primary osteoarthritis, left hand: Secondary | ICD-10-CM

## 2017-01-17 DIAGNOSIS — Z8679 Personal history of other diseases of the circulatory system: Secondary | ICD-10-CM

## 2017-01-17 DIAGNOSIS — M5416 Radiculopathy, lumbar region: Secondary | ICD-10-CM | POA: Diagnosis not present

## 2017-01-17 DIAGNOSIS — Z85828 Personal history of other malignant neoplasm of skin: Secondary | ICD-10-CM

## 2017-01-17 DIAGNOSIS — R5383 Other fatigue: Secondary | ICD-10-CM | POA: Diagnosis not present

## 2017-01-17 DIAGNOSIS — M797 Fibromyalgia: Secondary | ICD-10-CM

## 2017-01-17 MED ORDER — IIOPAMIDOL (ISOVUE-250) INJECTION 51%
3.0000 mL | Freq: Once | INTRAVENOUS | Status: AC
Start: 2017-01-17 — End: 2017-01-17
  Administered 2017-01-17: 3 mL
  Filled 2017-01-17: qty 50

## 2017-01-17 MED ORDER — LIDOCAINE HCL (PF) 1 % IJ SOLN
2.0000 mL | Freq: Once | INTRAMUSCULAR | Status: AC
  Administered 2017-01-17: 2 mL

## 2017-01-17 MED ORDER — METHYLPREDNISOLONE ACETATE 80 MG/ML IJ SUSP
80.0000 mg | Freq: Once | INTRAMUSCULAR | Status: AC
  Administered 2017-01-17: 80 mg

## 2017-01-17 NOTE — Procedures (Signed)
Lumbosacral Transforaminal Epidural Steroid Injection - Infraneural Approach with Fluoroscopic Guidance  Patient: Heather Ramsey      Date of Birth: 12-18-1945 MRN: [id number] PCP: Binnie Rail, MD      Visit Date: 01/17/2017   Universal Protocol:    Date/Time: 04/26/183:51 PM  Consent Given By: the patient  Position: PRONE   Additional Comments: Vital signs were monitored before and after the procedure. Patient was prepped and draped in the usual sterile fashion. The correct patient, procedure, and site was verified.   Injection Procedure Details:  Procedure Site One Meds Administered:  Meds ordered this encounter  Medications  . lidocaine (PF) (XYLOCAINE) 1 % injection 2 mL  . iopamidol (ISOVUE-250) 51 % injection 3 mL  . methylPREDNISolone acetate (DEPO-MEDROL) injection 80 mg      Laterality: Bilateral  Location/Site:  L5-S1  Needle size: 22 G  Needle type: Spinal  Needle Placement: Transforaminal  Findings:  -Contrast Used: 1 mL iohexol 180 mg iodine/mL   -Comments: Excellent flow of contrast along the nerve and into the epidural space.  Procedure Details: After squaring off the end-plates of the desired vertebral level to get a true AP view, the C-arm was obliqued to the painful side so that the superior articulating process is positioned about 1/3 the length of the inferior endplate.  The needle was aimed toward the junction of the superior articular process and the transverse process of the inferior vertebrae. The needle's initial entry is in the lower third of the foramen through Kambin's triangle. The soft tissues overlying this target were infiltrated with 2-3 ml. of 1% Lidocaine without Epinephrine.  The spinal needle was then inserted and advanced toward the target using a "trajectory" view along the fluoroscope beam.  Under AP and lateral visualization, the needle was advanced so it did not puncture dura and did not traverse medially beyond the 6  o'clock position of the pedicle. Bi-planar projections were used to confirm position. Aspiration was confirmed to be negative for CSF and/or blood. A 1-2 ml. volume of Isovue-250 was injected and flow of contrast was noted at each level. Radiographs were obtained for documentation purposes.   After attaining the desired flow of contrast documented above, a 0.5 to 1.0 ml test dose of 0.25% Marcaine was injected into each respective transforaminal space.  The patient was observed for 90 seconds post injection.  After no sensory deficits were reported, and normal lower extremity motor function was noted,   the above injectate was administered so that equal amounts of the injectate were placed at each foramen (level) into the transforaminal epidural space.   Additional Comments:  The patient tolerated the procedure well Dressing: Band-Aid    Post-procedure details: Patient was observed during the procedure. Post-procedure instructions were reviewed.  Patient left the clinic in stable condition.

## 2017-01-17 NOTE — Progress Notes (Signed)
Heather Ramsey - 71 y.o. female MRN [id number]  Date of birth: [birth year]  Office Visit Note: Visit Date: 01/17/2017 PCP: Binnie Rail, MD Referred by: Binnie Rail, MD  Subjective: Chief Complaint  Patient presents with  . Lower Back - Pain   HPI: Heather Ramsey is a very pleasant 71 year old female with chronic worsening low back pain and bilateral radicular type leg pain. Epidural injections have been intermittently successful. The last injection she states did ultimately did give her some relief and she is better than she was only first saw her. She says the last injection may be gave her 30-40% relief. She says" she is not in agony but still painful". She gets bilateral symptoms left equal to right and it does radiate down the legs. She does have somewhat of an anxiety disposition and does carry a diagnosis of fibromyalgia which complicates the issue. She is followed by Dr. Patrecia Pour. She recently, yesterday, saw Dr. Ellene Route for neurosurgical evaluation. We did refer her there at her request. He did recommend lumbar fusion. It appears that he took flexion and extension x-rays according to her report. She comes in today for planned bilateral L5 transforaminal epidural steroid injection for diagnostic and therapeutic standpoint as we have not tried that. This would probably be about the last injection we could perform for any length of time especially if this is not giving her enough relief. She says overall from the very first time we saw her she has done a lot better than the first amount of pain but she still has ongoing symptoms that does affect her quality of life. She has no focal weakness.    ROS Otherwise per HPI.  Assessment & Plan: Visit Diagnoses:  1. Lumbar radiculopathy     Plan: Findings:  Diagnostic of therapeutic bilateral L5 transforaminal epidural steroid injection.    Meds & Orders:  Meds ordered this encounter  Medications  . lidocaine (PF) (XYLOCAINE) 1 %  injection 2 mL  . iopamidol (ISOVUE-250) 51 % injection 3 mL  . methylPREDNISolone acetate (DEPO-MEDROL) injection 80 mg    Orders Placed This Encounter  Procedures  . XR C-ARM NO REPORT  . Epidural Steroid injection    Follow-up: Return if symptoms worsen or fail to improve. Continue to follow up with Dr. Patrecia Pour and Dr. Ellene Route depending on surgical consideration.  Procedures: No procedures performed  Lumbosacral Transforaminal Epidural Steroid Injection - Infraneural Approach with Fluoroscopic Guidance  Patient: Heather Ramsey      Date of Birth: 01-02-1946 MRN: [id number] PCP: Binnie Rail, MD      Visit Date: 01/17/2017   Universal Protocol:    Date/Time: 04/26/183:51 PM  Consent Given By: the patient  Position: PRONE   Additional Comments: Vital signs were monitored before and after the procedure. Patient was prepped and draped in the usual sterile fashion. The correct patient, procedure, and site was verified.   Injection Procedure Details:  Procedure Site One Meds Administered:  Meds ordered this encounter  Medications  . lidocaine (PF) (XYLOCAINE) 1 % injection 2 mL  . iopamidol (ISOVUE-250) 51 % injection 3 mL  . methylPREDNISolone acetate (DEPO-MEDROL) injection 80 mg      Laterality: Bilateral  Location/Site:  L5-S1  Needle size: 22 G  Needle type: Spinal  Needle Placement: Transforaminal  Findings:  -Contrast Used: 1 mL iohexol 180 mg iodine/mL   -Comments: Excellent flow of contrast along the nerve and into the epidural space.  Procedure Details: After squaring  off the end-plates of the desired vertebral level to get a true AP view, the C-arm was obliqued to the painful side so that the superior articulating process is positioned about 1/3 the length of the inferior endplate.  The needle was aimed toward the junction of the superior articular process and the transverse process of the inferior vertebrae. The needle's initial entry is in the  lower third of the foramen through Kambin's triangle. The soft tissues overlying this target were infiltrated with 2-3 ml. of 1% Lidocaine without Epinephrine.  The spinal needle was then inserted and advanced toward the target using a "trajectory" view along the fluoroscope beam.  Under AP and lateral visualization, the needle was advanced so it did not puncture dura and did not traverse medially beyond the 6 o'clock position of the pedicle. Bi-planar projections were used to confirm position. Aspiration was confirmed to be negative for CSF and/or blood. A 1-2 ml. volume of Isovue-250 was injected and flow of contrast was noted at each level. Radiographs were obtained for documentation purposes.   After attaining the desired flow of contrast documented above, a 0.5 to 1.0 ml test dose of 0.25% Marcaine was injected into each respective transforaminal space.  The patient was observed for 90 seconds post injection.  After no sensory deficits were reported, and normal lower extremity motor function was noted,   the above injectate was administered so that equal amounts of the injectate were placed at each foramen (level) into the transforaminal epidural space.   Additional Comments:  The patient tolerated the procedure well Dressing: Band-Aid    Post-procedure details: Patient was observed during the procedure. Post-procedure instructions were reviewed.  Patient left the clinic in stable condition.   Clinical History: Lumbar spine MRI12/12/2015   IMPRESSION: 1. Advanced L4-5 facet arthrosis with grade 1 anterolisthesis, moderate spinal stenosis, and left greater than right lateral recess stenosis with potential left L5 nerve impingement. 2. Mild right and mild-to-moderate left neural foraminal stenosis at L5-S1 due to disc and facet degeneration.   She reports that she has never smoked. She has never used smokeless tobacco. No results for input(s): HGBA1C, LABURIC in the last 8760  hours.  Objective:  VS:  HT:    WT:   BMI:     BP:111/73  HR:77bpm  TEMP: ( )  RESP:98 % Physical Exam  Musculoskeletal:  Patient ambulates without aid she is a little slow to rise from a seated position. She ambulates with a normal gait with slightly forward flexed lumbar spine. She has good distal strength.    Ortho Exam Imaging: Xr C-arm No Report  Result Date: 01/17/2017 Please see Notes or Procedures tab for imaging impression.   Past Medical/Family/Surgical/Social History: Medications & Allergies reviewed per EMR Patient Active Problem List   Diagnosis Date Noted  . Fibromyalgia 01/03/2017  . Primary insomnia 01/03/2017  . Other fatigue 01/03/2017  . Primary osteoarthritis of both hands 01/03/2017  . Trochanteric bursitis of both hips 01/03/2017  . Hypertension 12/29/2016  . 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 Lumbar radiculopathy 09/04/2016  . Onychomycosis 10/13/2015  . 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 Abnormal EKG 02/05/2012  . Atrophic vaginitis   . Basal cell cancer 04/24/2011  . DIVERTICULOSIS, COLON 06/09/2009  . Hyperlipidemia 05/31/2008  . IRRITABLE BOWEL SYNDROME 05/27/2008  . Myalgia and myositis 05/27/2008  . Osteopenia 05/27/2008  . Chronic fatigue syndrome 05/27/2008   Past Medical History:  Diagnosis Date  . Allergy   .  Atrophic vaginitis   . Chest pain    negative  cardiac evaluation; Dr Percival Spanish  . Diverticulosis of colon 2005& 2010   FH colon cancer  . Fibromyalgia    Dr Estanislado Pandy  . GERD (gastroesophageal reflux disease)   . Heart murmur   . Hemorrhoid   . Hyperlipidemia   . IBS (irritable bowel syndrome)   . Migraine   . MVP (mitral valve prolapse)   . Osteopenia   . Pulmonary nodule 07/2006  . UTI (lower urinary tract infection) 2005   Citrobacter koseri, hospitalized w/ kidney infection 1983   Family History  Problem Relation Age of Onset  . Heart attack Father  7    Died with multiple medical problems  . Colon cancer Father 84  . Diabetes Father   . Tuberculosis Paternal Grandmother   . Stroke Mother 69  . Hypothyroidism Mother   . Hypertension Mother   . Hypothyroidism Sister   . Hypertension Sister   . Breast cancer Sister   . Diabetes Sister   . Heart attack Sister     2 sisters > 55   Past Surgical History:  Procedure Laterality Date  . BREAST BIOPSY Left 2003  . [place]   with bladder tack   . COLONOSCOPY  07/2014   negative; Dr Olevia Perches  . FOOT SURGERY Left 2014   Social History   Occupational History  . Not on file.   Social History Main Topics  . Smoking status: Never Smoker  . Smokeless tobacco: Never Used  . Alcohol use No  . Drug use: No  . Sexual activity: Yes    Birth control/ protection: Post-menopausal

## 2017-01-17 NOTE — Progress Notes (Deleted)
Patient states she did have some relief with last injection but it just took awhile to feel any relief. Maybe  30% relief. "Not in agony now but still painful" Left side=right side.

## 2017-01-17 NOTE — Patient Instructions (Signed)

## 2017-01-18 ENCOUNTER — Other Ambulatory Visit: Payer: Self-pay | Admitting: *Deleted

## 2017-01-18 MED ORDER — AMLODIPINE BESYLATE 5 MG PO TABS: 7.5000 mg | ORAL_TABLET | Freq: Every day | ORAL | 2 refills | Status: DC

## 2017-01-18 NOTE — Telephone Encounter (Signed)
Rec'd fax stating pt states she is now taking 1.5 mg of the amlodipine needing updated script. Verified chart MD did advise for pt to increase to 1.5 mg (see email). Sent updated script to No Name...Johny Chess

## 2017-01-27 NOTE — Patient Instructions (Addendum)

## 2017-01-27 NOTE — Progress Notes (Signed)
Subjective:    Patient ID: Heather Ramsey, female    DOB: 12-Nov-1945, 71 y.o.   MRN: [id number]  HPI The patient is here for follow up.    Hypertension: We started her on losartan two months ago.  She is taking her medication daily. She is compliant with a low sodium diet.  She denies chest pain, palpitations, edema, shortness of breath and regular headaches. She is exercising regularly - walking some.  She does monitor her blood pressure at home - it has been well controlled but after she had a cortisone injection recently it has been elevated since then.    She was planning on prolonging surgery and continuing with the injections, but she thinks she will go ahead with the surgery next month.    Hyperlipidemia: She did not tolerated lipitor.  She is taking red yeast rice intermittently and fish oil, but no prescription medication.  She is compliant with a low fat/cholesterol diet. She is exercising regularly - some walking.    Tick bite:  It itches in the area.  That day she went out in a field.  She got him off that day. She does not think the tick was imbedded.    She has had some urinary hesitancy and feeling like she has to go and will not be able to go since she started the BP medication.  She denies dysuria or hematuria.   Medications and allergies reviewed with patient and updated if appropriate.  Patient Active Problem List   Diagnosis Date Noted  . Fibromyalgia 01/03/2017  . Primary insomnia 01/03/2017  . Other fatigue 01/03/2017  . Primary osteoarthritis of both hands 01/03/2017  . Trochanteric bursitis of both hips 01/03/2017  . Hypertension 12/29/2016  . 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 Lumbar radiculopathy 09/04/2016  . Onychomycosis 10/13/2015  . 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 Abnormal EKG 02/05/2012  . Atrophic vaginitis   . Basal cell cancer 04/24/2011  . DIVERTICULOSIS, COLON 06/09/2009  .  Hyperlipidemia 05/31/2008  . IRRITABLE BOWEL SYNDROME 05/27/2008  . Myalgia and myositis 05/27/2008  . Osteopenia 05/27/2008  . Chronic fatigue syndrome 05/27/2008    Current Outpatient Prescriptions on File Prior to Visit  Medication Sig Dispense Refill  . amLODipine (NORVASC) 5 MG tablet Take 1.5 tablets (7.5 mg total) by mouth daily. 45 tablet 2  . b complex vitamins tablet Take 1 tablet by mouth daily.      . Calcium Carbonate-Vitamin D (CALCIUM + D PO) Take by mouth daily.    . Coenzyme Q10 (COQ10) 100 MG CAPS Take by mouth 3 (three) times daily.      . cyclobenzaprine (FLEXERIL) 10 MG tablet Take 10 mg by mouth at bedtime.    . diclofenac sodium (VOLTAREN) 1 % GEL Voltaren Gel 3 grams to 3 large joints upto TID 3 TUBES with 3 refills 3 Tube 3  . fish oil-omega-3 fatty acids 1000 MG capsule Take 2 g by mouth daily.      . Ginger, Zingiber officinalis, (GINGER ROOT) 550 MG CAPS Take by mouth daily.      Marland Kitchen HYDROcodone-acetaminophen (NORCO/VICODIN) 5-325 MG tablet Take 1 tablet by mouth every 8 (eight) hours as needed for moderate pain. 40 tablet 0  . Ibuprofen (MOTRIN PO) Take by mouth.    . lidocaine (LIDODERM) 5 % Place 1 patch onto the skin as needed. Remove & Discard patch within 12 hours or as directed by MD    .  losartan (COZAAR) 50 MG tablet Take 1 tablet (50 mg total) by mouth daily. 90 tablet 3  . magnesium 30 MG tablet Take 30 mg by mouth 2 (two) times daily.      . methocarbamol (ROBAXIN) 500 MG tablet Take 500 mg by mouth 4 (four) times daily.    . nitrofurantoin (MACRODANTIN) 50 MG capsule Take 50 mg by mouth as needed.    . Psyllium (METAMUCIL) 28.3 % POWD Take 1 scoop dissolved in at least 8 ounces water/juice and drink twice daily 1 Bottle 0  . Red Yeast Rice Extract (RED YEAST RICE PO) Take by mouth.      . TURMERIC PO Take 400 mg by mouth daily.     No current facility-administered medications on file prior to visit.     Past Medical History:  Diagnosis Date  .  Allergy   . Atrophic vaginitis   . Chest pain    negative  cardiac evaluation; Dr Percival Spanish  . Diverticulosis of colon 2005& 2010   FH colon cancer  . Fibromyalgia    Dr Estanislado Pandy  . GERD (gastroesophageal reflux disease)   . Heart murmur   . Hemorrhoid   . Hyperlipidemia   . IBS (irritable bowel syndrome)   . Migraine   . MVP (mitral valve prolapse)   . Osteopenia   . Pulmonary nodule 07/2006  . UTI (lower urinary tract infection) 2005   Citrobacter koseri, hospitalized w/ kidney infection 1983    Past Surgical History:  Procedure Laterality Date  . BREAST BIOPSY Left 2003  . [place]   with bladder tack   . COLONOSCOPY  07/2014   negative; Dr Olevia Perches  . FOOT SURGERY Left 2014    Social History   Social History  . Marital status: Married    Spouse name: N/A  . Number of children: 2  . Years of education: N/A   Social History Main Topics  . Smoking status: Never Smoker  . Smokeless tobacco: Never Used  . Alcohol use No  . Drug use: No  . Sexual activity: Yes    Birth control/ protection: Post-menopausal   Other Topics Concern  . None   Social History Narrative   Regular exercise- yes: walks, but not as much due to knee arthritis.  Does stretching   Lives with husband.             Family History  Problem Relation Age of Onset  . Heart attack Father 88    Died with multiple medical problems  . Colon cancer Father 13  . Diabetes Father   . Tuberculosis Paternal Grandmother   . Stroke Mother 67  . Hypothyroidism Mother   . Hypertension Mother   . Hypothyroidism Sister   . Hypertension Sister   . Breast cancer Sister   . Diabetes Sister   . Heart attack Sister     2 sisters > 86    Review of Systems  Constitutional: Negative for chills and fever.  Respiratory: Negative for cough, shortness of breath and wheezing.   Cardiovascular: Negative for chest pain, palpitations and leg swelling.  Gastrointestinal: Negative for abdominal  pain.  Genitourinary: Positive for difficulty urinating. Negative for dysuria, frequency and hematuria.  Neurological: Positive for headaches (occ, allergy related). Negative for dizziness and light-headedness.       Objective:   Vitals:   01/28/17 0925  BP: (!) 136/96  Pulse: 72  Resp: 16  Temp: 97.8 F (36.6 C)  Wt Readings from Last 3 Encounters:  01/28/17 129 lb (58.5 kg)  01/17/17 127 lb (57.6 kg)  12/31/16 129 lb (58.5 kg)   Body mass index is 23.59 kg/m.   Physical Exam    Constitutional: Appears well-developed and well-nourished. No distress.  HENT:  Head: Normocephalic and atraumatic.  Neck: Neck supple. No tracheal deviation present. No thyromegaly present.  No cervical lymphadenopathy Cardiovascular: Normal rate, regular rhythm and normal heart sounds.   No murmur heard. No carotid bruit .  No edema Pulmonary/Chest: Effort normal and breath sounds normal. No respiratory distress. No has no wheezes. No rales.  Skin: Skin is warm and dry. Not diaphoretic.  Psychiatric: Normal mood and affect. Behavior is normal.      Assessment & Plan:    See Problem List for Assessment and Plan of chronic medical problems.

## 2017-01-28 ENCOUNTER — Other Ambulatory Visit (INDEPENDENT_AMBULATORY_CARE_PROVIDER_SITE_OTHER): Payer: PPO

## 2017-01-28 ENCOUNTER — Encounter: Payer: Self-pay | Admitting: Internal Medicine

## 2017-01-28 ENCOUNTER — Ambulatory Visit (INDEPENDENT_AMBULATORY_CARE_PROVIDER_SITE_OTHER): Payer: PPO | Admitting: Internal Medicine

## 2017-01-28 ENCOUNTER — Other Ambulatory Visit: Payer: PPO

## 2017-01-28 VITALS — BP 136/96 | HR 72 | Temp 97.8°F | Resp 16 | Ht 62.0 in | Wt 129.0 lb

## 2017-01-28 DIAGNOSIS — M8589 Other specified disorders of bone density and structure, multiple sites: Secondary | ICD-10-CM | POA: Diagnosis not present

## 2017-01-28 DIAGNOSIS — I10 Essential (primary) hypertension: Secondary | ICD-10-CM

## 2017-01-28 DIAGNOSIS — E7800 Pure hypercholesterolemia, unspecified: Secondary | ICD-10-CM | POA: Diagnosis not present

## 2017-01-28 DIAGNOSIS — R3911 Hesitancy of micturition: Secondary | ICD-10-CM

## 2017-01-28 LAB — COMPREHENSIVE METABOLIC PANEL
ALT: 17 U/L (ref 0–35)
AST: 16 U/L (ref 0–37)
Albumin: 4.6 g/dL (ref 3.5–5.2)
Alkaline Phosphatase: 45 U/L (ref 39–117)
BUN: 13 mg/dL (ref 6–23)
CALCIUM: 9.9 mg/dL (ref 8.4–10.5)
CO2: 30 meq/L (ref 19–32)
Chloride: 98 mEq/L (ref 96–112)
Creatinine, Ser: 0.7 mg/dL (ref 0.40–1.20)
GFR: 87.84 mL/min (ref >60.00)
Glucose, Bld: 98 mg/dL (ref 70–99)
POTASSIUM: 3.8 meq/L (ref 3.5–5.1)
Sodium: 134 mEq/L — ABNORMAL LOW (ref 135–145)
Total Bilirubin: 0.4 mg/dL (ref 0.2–1.2)
Total Protein: 7 g/dL (ref 6.0–8.3)

## 2017-01-28 LAB — URINALYSIS, ROUTINE W REFLEX MICROSCOPIC
Bilirubin Urine: NEGATIVE
HGB URINE DIPSTICK: NEGATIVE
KETONES UR: NEGATIVE
Nitrite: NEGATIVE
RBC / HPF: NONE SEEN (ref 0–?)
Specific Gravity, Urine: <=1.005 — AB (ref 1.000–1.030)
TOTAL PROTEIN, URINE-UPE24: NEGATIVE
URINE GLUCOSE: NEGATIVE
UROBILINOGEN UA: 0.2 (ref 0.0–1.0)
pH: 6.5 (ref 5.0–8.0)

## 2017-01-28 MED ORDER — AMLODIPINE BESYLATE 5 MG PO TABS: 7.5000 mg | ORAL_TABLET | Freq: Every day | ORAL | 3 refills | Status: DC

## 2017-01-28 NOTE — Assessment & Plan Note (Signed)
BP was controlled prior to cortisone injection - now elevated She plans on having surgery Will just continue current meds for now and monitor - will likely improve without steroid injections If continues to be elevated she will let me know and we can increase the losartan cmp today

## 2017-01-28 NOTE — Progress Notes (Signed)
Pre visit review using our clinic review tool, if applicable. No additional management support is needed unless otherwise documented below in the visit note. 

## 2017-01-28 NOTE — Assessment & Plan Note (Signed)
Associated with some difficulty urinating Will check UA

## 2017-01-28 NOTE — Assessment & Plan Note (Addendum)
Did not tolerate actonel or evista Managed by gyn/ rheumatology Does not want to consider any other medication at this time - will discuss at a later date

## 2017-01-28 NOTE — Assessment & Plan Note (Signed)
Did not tolerate lipitor Does not want to consider a different statin at this time Will discuss at future visit

## 2017-01-30 ENCOUNTER — Other Ambulatory Visit: Payer: Self-pay | Admitting: Neurological Surgery

## 2017-01-31 ENCOUNTER — Other Ambulatory Visit: Payer: Self-pay | Admitting: Internal Medicine

## 2017-01-31 MED ORDER — NITROFURANTOIN MONOHYD MACRO 100 MG PO CAPS: 100.0000 mg | ORAL_CAPSULE | Freq: Two times a day (BID) | ORAL | 0 refills | Status: DC

## 2017-02-19 NOTE — Pre-Procedure Instructions (Signed)
    Heather Ramsey  02/19/2017      [facility] [address], [place] [address] [place] [address] Phone: [phone] Fax: [phone]    Your procedure is scheduled on June 5.  Report to [facility] Admitting at 9 A.M.  Call this number if you have problems the morning of surgery:  [phone]   Remember:  Do not eat food or drink liquids after midnight.  Take these medicines the morning of surgery with A SIP OF WATER : amLODipin (NORVASC)  IF needed HYDROcodone-acetaminophen (NORCO/VICODIN), methocarbamol (ROBAXIN)    STOP aspirin, herbal medications, fish oil, vitamins, NSAIDS-aleve, advil, ibuprofen              Do not wear jewelry, make-up or nail polish.  Do not wear lotions, powders, or perfumes, or deoderant.  Do not shave 48 hours prior to surgery.  Men may shave face and neck.  Do not bring valuables to the hospital.  [facility] is not responsible for any belongings or valuables.  Contacts, dentures or bridgework may not be worn into surgery.  Leave your suitcase in the car.  After surgery it may be brought to your room.  For patients admitted to the hospital, discharge time will be determined by your treatment team.  Patients discharged the day of surgery will not be allowed to drive home.   Name and phone number of your driver:    Special instructions:  Preparing for surgery  Please read over the following fact sheets that you were given. Pain Booklet and Surgical Site Infection Prevention

## 2017-02-20 ENCOUNTER — Encounter (HOSPITAL_COMMUNITY)
Admission: RE | Admit: 2017-02-20 | Discharge: 2017-02-20 | Disposition: A | Discharge status: Home / Self Care | Payer: PPO | Source: Ambulatory Visit | Attending: Neurological Surgery | Admitting: Neurological Surgery

## 2017-02-20 ENCOUNTER — Encounter (HOSPITAL_COMMUNITY): Payer: Self-pay

## 2017-02-20 DIAGNOSIS — M4316 Spondylolisthesis, lumbar region: Secondary | ICD-10-CM | POA: Insufficient documentation

## 2017-02-20 DIAGNOSIS — Z01818 Encounter for other preprocedural examination: Secondary | ICD-10-CM | POA: Insufficient documentation

## 2017-02-20 HISTORY — DX: Essential (primary) hypertension: I10

## 2017-02-20 LAB — BASIC METABOLIC PANEL
ANION GAP: 7 (ref 5–15)
BUN: 19 mg/dL (ref 6–20)
CHLORIDE: 104 mmol/L (ref 101–111)
CO2: 26 mmol/L (ref 22–32)
Calcium: 9.4 mg/dL (ref 8.9–10.3)
Creatinine, Ser: 0.68 mg/dL (ref 0.44–1.00)
Glucose, Bld: 102 mg/dL — ABNORMAL HIGH (ref 65–99)
POTASSIUM: 4.2 mmol/L (ref 3.5–5.1)
SODIUM: 137 mmol/L (ref 135–145)

## 2017-02-20 LAB — CBC
HEMATOCRIT: 38.3 % (ref 36.0–46.0)
HEMOGLOBIN: 12.8 g/dL (ref 12.0–15.0)
MCH: 30 pg (ref 26.0–34.0)
MCHC: 33.4 g/dL (ref 30.0–36.0)
MCV: 89.9 fL (ref 78.0–100.0)
Platelets: 227 10*3/uL (ref 150–400)
RBC: 4.26 MIL/uL (ref 3.87–5.11)
RDW: 13 % (ref 11.5–15.5)
WBC: 5.1 10*3/uL (ref 4.0–10.5)

## 2017-02-20 LAB — ABO/RH: ABO/RH(D): A NEG

## 2017-02-20 LAB — SURGICAL PCR SCREEN
MRSA, PCR: NEGATIVE
Staphylococcus aureus: NEGATIVE

## 2017-02-26 ENCOUNTER — Inpatient Hospital Stay (HOSPITAL_COMMUNITY): Payer: PPO

## 2017-02-26 ENCOUNTER — Inpatient Hospital Stay (HOSPITAL_COMMUNITY)
Admission: RE | Disposition: A | Discharge status: Home / Self Care | Payer: Self-pay | Source: Ambulatory Visit | Attending: Neurological Surgery

## 2017-02-26 ENCOUNTER — Inpatient Hospital Stay (HOSPITAL_COMMUNITY): Payer: PPO | Admitting: Certified Registered"

## 2017-02-26 ENCOUNTER — Encounter (HOSPITAL_COMMUNITY): Payer: Self-pay | Admitting: Certified Registered"

## 2017-02-26 ENCOUNTER — Inpatient Hospital Stay (HOSPITAL_COMMUNITY)
Admission: RE | Admit: 2017-02-26 | Discharge: 2017-03-01 | DRG: 454 | Disposition: A | Discharge status: Home / Self Care | Payer: PPO | Source: Ambulatory Visit | Attending: Neurological Surgery | Admitting: Neurological Surgery

## 2017-02-26 DIAGNOSIS — M797 Fibromyalgia: Secondary | ICD-10-CM | POA: Diagnosis present

## 2017-02-26 DIAGNOSIS — I341 Nonrheumatic mitral (valve) prolapse: Secondary | ICD-10-CM | POA: Diagnosis not present

## 2017-02-26 DIAGNOSIS — M4326 Fusion of spine, lumbar region: Secondary | ICD-10-CM | POA: Diagnosis not present

## 2017-02-26 DIAGNOSIS — M5117 Intervertebral disc disorders with radiculopathy, lumbosacral region: Secondary | ICD-10-CM | POA: Diagnosis not present

## 2017-02-26 DIAGNOSIS — M4316 Spondylolisthesis, lumbar region: Secondary | ICD-10-CM | POA: Diagnosis present

## 2017-02-26 DIAGNOSIS — Z888 Allergy status to other drugs, medicaments and biological substances status: Secondary | ICD-10-CM

## 2017-02-26 DIAGNOSIS — I10 Essential (primary) hypertension: Secondary | ICD-10-CM | POA: Diagnosis not present

## 2017-02-26 DIAGNOSIS — M5416 Radiculopathy, lumbar region: Secondary | ICD-10-CM | POA: Diagnosis not present

## 2017-02-26 DIAGNOSIS — D62 Acute posthemorrhagic anemia: Secondary | ICD-10-CM | POA: Diagnosis not present

## 2017-02-26 DIAGNOSIS — M4317 Spondylolisthesis, lumbosacral region: Secondary | ICD-10-CM | POA: Diagnosis not present

## 2017-02-26 DIAGNOSIS — R509 Fever, unspecified: Secondary | ICD-10-CM | POA: Diagnosis not present

## 2017-02-26 DIAGNOSIS — Z881 Allergy status to other antibiotic agents status: Secondary | ICD-10-CM

## 2017-02-26 DIAGNOSIS — Z885 Allergy status to narcotic agent status: Secondary | ICD-10-CM

## 2017-02-26 DIAGNOSIS — I959 Hypotension, unspecified: Secondary | ICD-10-CM | POA: Diagnosis not present

## 2017-02-26 DIAGNOSIS — M4807 Spinal stenosis, lumbosacral region: Secondary | ICD-10-CM | POA: Diagnosis not present

## 2017-02-26 DIAGNOSIS — M48062 Spinal stenosis, lumbar region with neurogenic claudication: Secondary | ICD-10-CM | POA: Diagnosis not present

## 2017-02-26 DIAGNOSIS — Z419 Encounter for procedure for purposes other than remedying health state, unspecified: Secondary | ICD-10-CM

## 2017-02-26 HISTORY — PX: OTHER SURGICAL HISTORY: SHX169

## 2017-02-26 SURGERY — POSTERIOR LUMBAR FUSION 2 LEVEL
Anesthesia: General | Site: Spine Lumbar

## 2017-02-26 MED ORDER — THROMBIN 5000 UNITS EX SOLR
CUTANEOUS | Status: AC
  Filled 2017-02-26: qty 5000

## 2017-02-26 MED ORDER — OXYCODONE HCL 5 MG/5ML PO SOLN: 5.0000 mg | Freq: Once | ORAL | Status: DC | PRN

## 2017-02-26 MED ORDER — DEXTROSE 5 % IV SOLN
INTRAVENOUS | Status: DC | PRN
  Administered 2017-02-26: 40 ug/min via INTRAVENOUS

## 2017-02-26 MED ORDER — FENTANYL CITRATE (PF) 250 MCG/5ML IJ SOLN
INTRAMUSCULAR | Status: AC
  Filled 2017-02-26: qty 5

## 2017-02-26 MED ORDER — METHOCARBAMOL 500 MG PO TABS: 500.0000 mg | ORAL_TABLET | Freq: Every evening | ORAL | Status: DC | PRN

## 2017-02-26 MED ORDER — FENTANYL CITRATE (PF) 100 MCG/2ML IJ SOLN: 25.0000 ug | INTRAMUSCULAR | Status: DC | PRN

## 2017-02-26 MED ORDER — SODIUM CHLORIDE 0.9% FLUSH
3.0000 mL | Freq: Two times a day (BID) | INTRAVENOUS | Status: DC
  Administered 2017-02-27: 3 mL via INTRAVENOUS

## 2017-02-26 MED ORDER — ONDANSETRON HCL 4 MG PO TABS: 4.0000 mg | ORAL_TABLET | Freq: Four times a day (QID) | ORAL | Status: DC | PRN

## 2017-02-26 MED ORDER — ROCURONIUM BROMIDE 100 MG/10ML IV SOLN
INTRAVENOUS | Status: DC | PRN
  Administered 2017-02-26: 50 mg via INTRAVENOUS
  Administered 2017-02-26 (×4): 20 mg via INTRAVENOUS

## 2017-02-26 MED ORDER — AMLODIPINE BESYLATE 2.5 MG PO TABS
7.5000 mg | ORAL_TABLET | Freq: Every day | ORAL | Status: DC
  Administered 2017-02-28: 7.5 mg via ORAL
  Filled 2017-02-26 (×3): qty 1

## 2017-02-26 MED ORDER — BUPIVACAINE HCL (PF) 0.5 % IJ SOLN
INTRAMUSCULAR | Status: DC | PRN
Start: 2017-02-26 — End: 2017-02-26
  Administered 2017-02-26: 20 mL
  Administered 2017-02-26: 5 mL

## 2017-02-26 MED ORDER — PSYLLIUM 95 % PO PACK
1.0000 | PACK | Freq: Two times a day (BID) | ORAL | Status: DC
  Administered 2017-02-27 – 2017-03-01 (×4): 1 via ORAL
  Filled 2017-02-26 (×6): qty 1

## 2017-02-26 MED ORDER — SODIUM CHLORIDE 0.9 % IR SOLN
Status: DC | PRN
  Administered 2017-02-26: 500 mL

## 2017-02-26 MED ORDER — CEFAZOLIN SODIUM-DEXTROSE 2-3 GM-% IV SOLR
INTRAVENOUS | Status: DC | PRN
  Administered 2017-02-26 (×2): 2 g via INTRAVENOUS

## 2017-02-26 MED ORDER — SODIUM CHLORIDE 0.9% FLUSH: 3.0000 mL | INTRAVENOUS | Status: DC | PRN

## 2017-02-26 MED ORDER — NITROFURANTOIN MACROCRYSTAL 50 MG PO CAPS
50.0000 mg | ORAL_CAPSULE | Freq: Every day | ORAL | Status: DC
  Filled 2017-02-26 (×2): qty 1

## 2017-02-26 MED ORDER — OXYCODONE HCL 5 MG PO TABS: 5.0000 mg | ORAL_TABLET | Freq: Once | ORAL | Status: DC | PRN

## 2017-02-26 MED ORDER — LACTATED RINGERS IV SOLN
INTRAVENOUS | Status: DC | PRN
  Administered 2017-02-26 (×2): via INTRAVENOUS

## 2017-02-26 MED ORDER — LIDOCAINE-EPINEPHRINE 1 %-1:100000 IJ SOLN
INTRAMUSCULAR | Status: DC | PRN
  Administered 2017-02-26: 5 mL

## 2017-02-26 MED ORDER — MENTHOL 3 MG MT LOZG: 1.0000 | LOZENGE | OROMUCOSAL | Status: DC | PRN

## 2017-02-26 MED ORDER — ALUM & MAG HYDROXIDE-SIMETH 200-200-20 MG/5ML PO SUSP: 30.0000 mL | Freq: Four times a day (QID) | ORAL | Status: DC | PRN

## 2017-02-26 MED ORDER — PHENYLEPHRINE HCL 10 MG/ML IJ SOLN
INTRAMUSCULAR | Status: DC | PRN
  Administered 2017-02-26 (×3): 80 ug via INTRAVENOUS

## 2017-02-26 MED ORDER — SODIUM CHLORIDE 0.9 % IV SOLN: 250.0000 mL | INTRAVENOUS | Status: DC

## 2017-02-26 MED ORDER — HYDROCODONE-ACETAMINOPHEN 5-325 MG PO TABS: 1.0000 | ORAL_TABLET | ORAL | Status: DC | PRN

## 2017-02-26 MED ORDER — ACETAMINOPHEN 325 MG PO TABS: 650.0000 mg | ORAL_TABLET | ORAL | Status: DC | PRN

## 2017-02-26 MED ORDER — ONDANSETRON HCL 4 MG/2ML IJ SOLN
INTRAMUSCULAR | Status: DC | PRN
  Administered 2017-02-26: 4 mg via INTRAVENOUS

## 2017-02-26 MED ORDER — CEFAZOLIN SODIUM-DEXTROSE 2-4 GM/100ML-% IV SOLN
2.0000 g | Freq: Three times a day (TID) | INTRAVENOUS | Status: AC
  Administered 2017-02-26 – 2017-02-27 (×2): 2 g via INTRAVENOUS
  Filled 2017-02-26 (×2): qty 100

## 2017-02-26 MED ORDER — HYDROMORPHONE HCL 1 MG/ML IJ SOLN: 1.0000 mg | INTRAMUSCULAR | Status: DC | PRN

## 2017-02-26 MED ORDER — HYDROCODONE-ACETAMINOPHEN 5-325 MG PO TABS
1.0000 | ORAL_TABLET | Freq: Three times a day (TID) | ORAL | Status: DC | PRN
  Administered 2017-02-27: 1 via ORAL
  Filled 2017-02-26: qty 1

## 2017-02-26 MED ORDER — LACTATED RINGERS IV SOLN: INTRAVENOUS | Status: DC

## 2017-02-26 MED ORDER — BUPIVACAINE HCL (PF) 0.5 % IJ SOLN
INTRAMUSCULAR | Status: AC
  Filled 2017-02-26: qty 30

## 2017-02-26 MED ORDER — SUGAMMADEX SODIUM 200 MG/2ML IV SOLN
INTRAVENOUS | Status: AC
  Filled 2017-02-26: qty 2

## 2017-02-26 MED ORDER — LIDOCAINE-EPINEPHRINE 1 %-1:100000 IJ SOLN
INTRAMUSCULAR | Status: AC
  Filled 2017-02-26: qty 1

## 2017-02-26 MED ORDER — SODIUM CHLORIDE 0.9 % IV SOLN
INTRAVENOUS | Status: DC | PRN
  Administered 2017-02-26: 16:00:00 via INTRAVENOUS

## 2017-02-26 MED ORDER — BISACODYL 10 MG RE SUPP: 10.0000 mg | Freq: Every day | RECTAL | Status: DC | PRN

## 2017-02-26 MED ORDER — LOSARTAN POTASSIUM 50 MG PO TABS: 50.0000 mg | ORAL_TABLET | Freq: Every day | ORAL | Status: DC

## 2017-02-26 MED ORDER — THROMBIN 5000 UNITS EX SOLR
OROMUCOSAL | Status: DC | PRN
  Administered 2017-02-26 (×3): 5 mL via TOPICAL

## 2017-02-26 MED ORDER — MIDAZOLAM HCL 5 MG/5ML IJ SOLN
INTRAMUSCULAR | Status: DC | PRN
  Administered 2017-02-26: 2 mg via INTRAVENOUS

## 2017-02-26 MED ORDER — LIDOCAINE HCL (CARDIAC) 20 MG/ML IV SOLN
INTRAVENOUS | Status: DC | PRN
  Administered 2017-02-26: 30 mg via INTRAVENOUS

## 2017-02-26 MED ORDER — DOCUSATE SODIUM 100 MG PO CAPS
100.0000 mg | ORAL_CAPSULE | Freq: Two times a day (BID) | ORAL | Status: DC
  Administered 2017-02-26 – 2017-03-01 (×6): 100 mg via ORAL
  Filled 2017-02-26 (×6): qty 1

## 2017-02-26 MED ORDER — SENNA 8.6 MG PO TABS
1.0000 | ORAL_TABLET | Freq: Two times a day (BID) | ORAL | Status: DC
  Administered 2017-02-26 – 2017-03-01 (×6): 8.6 mg via ORAL
  Filled 2017-02-26 (×6): qty 1

## 2017-02-26 MED ORDER — PROPOFOL 10 MG/ML IV BOLUS
INTRAVENOUS | Status: DC | PRN
  Administered 2017-02-26: 100 mg via INTRAVENOUS

## 2017-02-26 MED ORDER — 0.9 % SODIUM CHLORIDE (POUR BTL) OPTIME
TOPICAL | Status: DC | PRN
  Administered 2017-02-26: 1000 mL

## 2017-02-26 MED ORDER — PHENYLEPHRINE 40 MCG/ML (10ML) SYRINGE FOR IV PUSH (FOR BLOOD PRESSURE SUPPORT)
PREFILLED_SYRINGE | INTRAVENOUS | Status: AC
  Filled 2017-02-26: qty 10

## 2017-02-26 MED ORDER — PROPOFOL 10 MG/ML IV BOLUS
INTRAVENOUS | Status: AC
  Filled 2017-02-26: qty 20

## 2017-02-26 MED ORDER — MAGNESIUM OXIDE 400 (241.3 MG) MG PO TABS
200.0000 mg | ORAL_TABLET | Freq: Every day | ORAL | Status: DC
  Administered 2017-02-27 – 2017-03-01 (×3): 200 mg via ORAL
  Filled 2017-02-26 (×3): qty 0.5

## 2017-02-26 MED ORDER — ALBUMIN HUMAN 5 % IV SOLN
INTRAVENOUS | Status: DC | PRN
  Administered 2017-02-26 (×2): via INTRAVENOUS

## 2017-02-26 MED ORDER — CYCLOBENZAPRINE HCL 5 MG PO TABS
5.0000 mg | ORAL_TABLET | Freq: Every day | ORAL | Status: DC
  Administered 2017-02-26 – 2017-02-28 (×3): 5 mg via ORAL
  Filled 2017-02-26 (×3): qty 1

## 2017-02-26 MED ORDER — PHENOL 1.4 % MT LIQD: 1.0000 | OROMUCOSAL | Status: DC | PRN

## 2017-02-26 MED ORDER — FLEET ENEMA 7-19 GM/118ML RE ENEM: 1.0000 | ENEMA | Freq: Once | RECTAL | Status: DC | PRN

## 2017-02-26 MED ORDER — ONDANSETRON HCL 4 MG/2ML IJ SOLN
INTRAMUSCULAR | Status: AC
  Administered 2017-02-26: 4 mg via INTRAVENOUS
  Filled 2017-02-26: qty 2

## 2017-02-26 MED ORDER — ACETAMINOPHEN 650 MG RE SUPP: 650.0000 mg | RECTAL | Status: DC | PRN

## 2017-02-26 MED ORDER — CEFAZOLIN SODIUM-DEXTROSE 2-4 GM/100ML-% IV SOLN
INTRAVENOUS | Status: AC
  Filled 2017-02-26: qty 100

## 2017-02-26 MED ORDER — THROMBIN 20000 UNITS EX SOLR
CUTANEOUS | Status: AC
  Filled 2017-02-26: qty 20000

## 2017-02-26 MED ORDER — MIDAZOLAM HCL 2 MG/2ML IJ SOLN
INTRAMUSCULAR | Status: AC
  Filled 2017-02-26: qty 2

## 2017-02-26 MED ORDER — FENTANYL CITRATE (PF) 100 MCG/2ML IJ SOLN
INTRAMUSCULAR | Status: DC | PRN
  Administered 2017-02-26 (×6): 50 ug via INTRAVENOUS
  Administered 2017-02-26: 150 ug via INTRAVENOUS
  Administered 2017-02-26: 50 ug via INTRAVENOUS

## 2017-02-26 MED ORDER — DEXAMETHASONE SODIUM PHOSPHATE 10 MG/ML IJ SOLN
INTRAMUSCULAR | Status: DC | PRN
  Administered 2017-02-26: 10 mg via INTRAVENOUS

## 2017-02-26 MED ORDER — ONDANSETRON HCL 4 MG/2ML IJ SOLN
INTRAMUSCULAR | Status: AC
  Filled 2017-02-26: qty 2

## 2017-02-26 MED ORDER — POLYETHYLENE GLYCOL 3350 17 G PO PACK: 17.0000 g | PACK | Freq: Every day | ORAL | Status: DC | PRN

## 2017-02-26 MED ORDER — SUGAMMADEX SODIUM 200 MG/2ML IV SOLN
INTRAVENOUS | Status: DC | PRN
  Administered 2017-02-26: 200 mg via INTRAVENOUS

## 2017-02-26 MED ORDER — THROMBIN 20000 UNITS EX SOLR
CUTANEOUS | Status: DC | PRN
  Administered 2017-02-26: 20 mL via TOPICAL

## 2017-02-26 MED ORDER — DEXTROSE 5 % IV SOLN
INTRAVENOUS | Status: DC | PRN
  Administered 2017-02-26: 17:00:00 via INTRAVENOUS

## 2017-02-26 MED ORDER — DEXTROSE 5 % IV SOLN
INTRAVENOUS | Status: DC | PRN
  Administered 2017-02-26: 13:00:00 via INTRAVENOUS

## 2017-02-26 MED ORDER — ONDANSETRON HCL 4 MG/2ML IJ SOLN
4.0000 mg | Freq: Four times a day (QID) | INTRAMUSCULAR | Status: DC | PRN
  Administered 2017-02-26 – 2017-02-27 (×2): 4 mg via INTRAVENOUS
  Filled 2017-02-26: qty 2

## 2017-02-26 MED ORDER — MAGNESIUM 30 MG PO TABS: 30.0000 mg | ORAL_TABLET | Freq: Two times a day (BID) | ORAL | Status: DC

## 2017-02-26 SURGICAL SUPPLY — 80 items
ADH SKN CLS APL DERMABOND .7 (GAUZE/BANDAGES/DRESSINGS) ×1
APL SRG 60D 8 XTD TIP BNDBL (TIP)
BAG DECANTER FOR FLEXI CONT (MISCELLANEOUS) ×3 IMPLANT
BASKET BONE COLLECTION (BASKET) ×3 IMPLANT
BLADE CLIPPER SURG (BLADE) IMPLANT
BONE CANC CHIPS 20CC PCAN1/4 (Bone Implant) ×3 IMPLANT
BUR MATCHSTICK NEURO 3.0 LAGG (BURR) ×3 IMPLANT
CAGE COROENT PLIF 10X28-8 LUMB (Cage) ×4 IMPLANT
CAGE PLIF 8X9X23-12 LUMBAR (Cage) ×4 IMPLANT
CANISTER SUCT 3000ML PPV (MISCELLANEOUS) ×3 IMPLANT
CARTRIDGE OIL MAESTRO DRILL (MISCELLANEOUS) ×1 IMPLANT
CHIPS CANC BONE 20CC PCAN1/4 (Bone Implant) ×1 IMPLANT
CONT SPEC 4OZ CLIKSEAL STRL BL (MISCELLANEOUS) ×3 IMPLANT
COVER BACK TABLE 60X90IN (DRAPES) ×3 IMPLANT
DECANTER SPIKE VIAL GLASS SM (MISCELLANEOUS) ×3 IMPLANT
DERMABOND ADVANCED (GAUZE/BANDAGES/DRESSINGS) ×2
DERMABOND ADVANCED .7 DNX12 (GAUZE/BANDAGES/DRESSINGS) ×1 IMPLANT
DEVICE DISSECT PLASMABLAD 3.0S (MISCELLANEOUS) ×1 IMPLANT
DIFFUSER DRILL AIR PNEUMATIC (MISCELLANEOUS) ×1 IMPLANT
DRAPE C-ARM 42X72 X-RAY (DRAPES) ×6 IMPLANT
DRAPE HALF SHEET 40X57 (DRAPES) IMPLANT
DRAPE LAPAROTOMY 100X72X124 (DRAPES) ×3 IMPLANT
DRAPE POUCH INSTRU U-SHP 10X18 (DRAPES) ×3 IMPLANT
DRSG OPSITE POSTOP 4X6 (GAUZE/BANDAGES/DRESSINGS) ×2 IMPLANT
DURAPREP 26ML APPLICATOR (WOUND CARE) ×3 IMPLANT
DURASEAL APPLICATOR TIP (TIP) IMPLANT
DURASEAL SPINE SEALANT 3ML (MISCELLANEOUS) IMPLANT
ELECT REM PT RETURN 9FT ADLT (ELECTROSURGICAL) ×3
ELECTRODE REM PT RTRN 9FT ADLT (ELECTROSURGICAL) ×1 IMPLANT
GAUZE SPONGE 4X4 12PLY STRL (GAUZE/BANDAGES/DRESSINGS) ×1 IMPLANT
GAUZE SPONGE 4X4 16PLY XRAY LF (GAUZE/BANDAGES/DRESSINGS) ×4 IMPLANT
GLOVE BIOGEL PI IND STRL 7.0 (GLOVE) IMPLANT
GLOVE BIOGEL PI IND STRL 7.5 (GLOVE) IMPLANT
GLOVE BIOGEL PI IND STRL 8 (GLOVE) IMPLANT
GLOVE BIOGEL PI IND STRL 8.5 (GLOVE) ×2 IMPLANT
GLOVE BIOGEL PI INDICATOR 7.0 (GLOVE) ×2
GLOVE BIOGEL PI INDICATOR 7.5 (GLOVE) ×2
GLOVE BIOGEL PI INDICATOR 8 (GLOVE) ×8
GLOVE BIOGEL PI INDICATOR 8.5 (GLOVE) ×4
GLOVE ECLIPSE 7.0 STRL STRAW (GLOVE) ×2 IMPLANT
GLOVE ECLIPSE 7.5 STRL STRAW (GLOVE) ×6 IMPLANT
GLOVE ECLIPSE 8.5 STRL (GLOVE) ×6 IMPLANT
GOWN STRL REUS W/ TWL LRG LVL3 (GOWN DISPOSABLE) IMPLANT
GOWN STRL REUS W/ TWL XL LVL3 (GOWN DISPOSABLE) IMPLANT
GOWN STRL REUS W/TWL 2XL LVL3 (GOWN DISPOSABLE) ×10 IMPLANT
GOWN STRL REUS W/TWL LRG LVL3 (GOWN DISPOSABLE) ×3
GOWN STRL REUS W/TWL XL LVL3 (GOWN DISPOSABLE)
GRAFT BNE CANC CHIPS 1-8 20CC (Bone Implant) IMPLANT
HEMOSTAT POWDER KIT SURGIFOAM (HEMOSTASIS) ×6 IMPLANT
KIT BASIN OR (CUSTOM PROCEDURE TRAY) ×3 IMPLANT
KIT ROOM TURNOVER OR (KITS) ×3 IMPLANT
MODULE POWER NUVASIVE (MISCELLANEOUS) IMPLANT
NDL SPNL 18GX3.5 QUINCKE PK (NEEDLE) IMPLANT
NEEDLE HYPO 22GX1.5 SAFETY (NEEDLE) ×3 IMPLANT
NEEDLE SPNL 18GX3.5 QUINCKE PK (NEEDLE) IMPLANT
NS IRRIG 1000ML POUR BTL (IV SOLUTION) ×3 IMPLANT
OIL CARTRIDGE MAESTRO DRILL (MISCELLANEOUS)
PACK LAMINECTOMY NEURO (CUSTOM PROCEDURE TRAY) ×3 IMPLANT
PAD ARMBOARD 7.5X6 YLW CONV (MISCELLANEOUS) ×13 IMPLANT
PATTIES SURGICAL .5 X1 (DISPOSABLE) ×3 IMPLANT
PATTIES SURGICAL 1X1 (DISPOSABLE) ×4 IMPLANT
PLASMABLADE 3.0S (MISCELLANEOUS) ×3
POWER MODULE NUVASIVE (MISCELLANEOUS) ×3
ROD RELINE LOROTIC TI 5.5X55MM (Rod) ×4 IMPLANT
SCREW LOCK RELINE 5.5 TULIP (Screw) ×12 IMPLANT
SCREW RELINE-O POLY 6.5X40 (Screw) ×4 IMPLANT
SCREW RELINE-O POLY 6.5X45 (Screw) ×8 IMPLANT
SPONGE LAP 4X18 X RAY DECT (DISPOSABLE) IMPLANT
SPONGE SURGIFOAM ABS GEL 100 (HEMOSTASIS) ×3 IMPLANT
SUT PROLENE 6 0 BV (SUTURE) IMPLANT
SUT VIC AB 1 CT1 18XBRD ANBCTR (SUTURE) ×1 IMPLANT
SUT VIC AB 1 CT1 8-18 (SUTURE) ×6
SUT VIC AB 2-0 CP2 18 (SUTURE) ×3 IMPLANT
SUT VIC AB 3-0 SH 8-18 (SUTURE) ×3 IMPLANT
SYR 3ML LL SCALE MARK (SYRINGE) ×12 IMPLANT
SYR 5ML LL (SYRINGE) IMPLANT
TOWEL GREEN STERILE (TOWEL DISPOSABLE) ×3 IMPLANT
TOWEL GREEN STERILE FF (TOWEL DISPOSABLE) ×3 IMPLANT
TRAY FOLEY W/METER SILVER 16FR (SET/KITS/TRAYS/PACK) ×3 IMPLANT
WATER STERILE IRR 1000ML POUR (IV SOLUTION) ×3 IMPLANT

## 2017-02-26 NOTE — Transfer of Care (Signed)
Immediate Anesthesia Transfer of Care Note  Patient: Heather Ramsey  Procedure(s) Performed: Procedure(s): Lumbar Four-Five,Lumbar Five-Sacral One Posterior lumbar interbody fusion (N/A)  Patient Location: PACU  Anesthesia Type:General  Level of Consciousness: awake, oriented, sedated, drowsy, patient cooperative and responds to stimulation  Airway & Oxygen Therapy: Patient Spontanous Breathing and Patient connected to nasal cannula oxygen  Post-op Assessment: Report given to RN, Post -op Vital signs reviewed and stable, Patient moving all extremities and Patient moving all extremities X 4  Post vital signs: Reviewed and stable  Last Vitals:  Vitals:   02/26/17 0931 02/26/17 1715  BP: 119/70   Pulse: 69 75  Resp: 18 19  Temp: 36.8 C 36.7 C    Last Pain:  Vitals:   02/26/17 0947  TempSrc:   PainSc: 3          Complications: No apparent anesthesia complications

## 2017-02-26 NOTE — Progress Notes (Signed)
Orthopedic Tech Progress Note Patient Details:  Heather Ramsey [birth year] [id number] Patient has brace. Patient ID: [name], female   DOB: 05-Dec-1945, 71 y.o.   MRN: [id number]   Heather Ramsey 02/26/2017, 8:34 PM

## 2017-02-26 NOTE — H&P (Signed)
CHIEF COMPLAINT: Back pain, bilateral lower extremity pain since October 2017.  HISTORY OF PRESENT ILLNESS: [name] is a 71 year old right-handed individual who tells me that she has been having problems since October 2017. This process has now been going on over a year's time and she has been seen and treated by Dr. Laurence Spates. Dr. Ernestina Patches has done a number steroid injections on her and she notes the initial couple of injections had given her good relief, but the 3rd injection that she had gave relief in a very delayed fashion, and was not as complete. She is scheduled to have another injection, but because the pain was considerably worsening and her function was deteriorating, she is referred here for further evaluation of this process. She has had an MRI of the lumbar spine that we had the opportunity to review today in the office. 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 She has not had any plain radiographs performed in a while and today in the office, I obtained a lateral flexion-extension film of the lumbar spine. This film demonstrates that her 4 mm anterolisthesis at L4-L5 in the neutral position increases to 7 mm when flexed and does not decrease when she extends. The alignment of her spine in the coronal plane is good and normal. Clinically, the patient notes that she gets pain chronically in her back, but radiation into the legs and buttocks has occurred with increasing frequency, such that it limits her capacity to walk any distance. She notes that, at this point, she can walk comfortably for less than 1/4 of a mile and perhaps not even as much as 1/8 of a mile on most days without having to stop and rest. Her motor strength she feels has been intact throughout all this time, and as noted on her exam today that she can  stand and walk without antalgia and has good strength in the major groups including the iliopsoas, quads, tibialis anterior, and the gastrocs.   Her past medical history reveals that her general health has been very good. She has had a history of high blood pressure. 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 Her other surgeries include a bladder tacking procedure. She notes allergies to Clarithromycin, Clindamycin, Levofloxacin, Bactroban, Atorvastatin, Risedronate Sodium, and Tramadol.  CURRENT MEDICATIONS: Include Cyclobenzaprine, Hydrocodone, Methocarbamol, Macrodantin, Voltaren, Losartan, and Motrin.  SYSTEMS REVIEW: Notable for the back pain, leg pain, arm pain, arthritis, leg pain while walking, and high cholesterol on a 14-point review sheet.  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 I noted to the patient and her husband that there is no overt compression of the nerves, but this condition is generally progressive and degenerative in nature and can cause further irritability with time. She has tried all manner of conservative treatment and has recently been released from physical therapy, citing that she is worsening despite their maximal efforts. She is to have another epidural injection by Dr. Ernestina Patches in the near future, and this is reasonable in an effort to try to put off the surgical intervention, but ultimately I believe that she will require surgical decompression  at L4-5 and L5-S1 with stabilization using pedicle screws from L4 to the sacrum. I demonstrated to her and her husband on a model what that surgery entails and what it looks like in terms of the hardware that is placed in her spine. Surgery is indeed a major undertaking and typically takes  approximately 4 hours of time in the operating room. Initial recovery in the hospital typically lasts about 4 days, and once the patient is ambulatory and functioning independently, they can be discharged home using an external corset for a period of about 6-8 weeks depending on how they are healing in the postoperative phase. Thereafter, recovery continues over the next months, that it can typically take between 3 and 6 months to recover fully enough to return to near normal activities. The surgery is a big undertaking and I believe is always wanted to be left for option of last resort. However, given the diligence that she has had with conservative efforts and the failure of those efforts, I believe the surgery is an appropriate consideration at this time. She is now admitted for surgery.

## 2017-02-26 NOTE — Anesthesia Procedure Notes (Signed)
Procedure Name: Intubation Date/Time: 02/26/2017 1:02 PM Performed by: Gaylene Brooks Pre-anesthesia Checklist: Patient identified, Emergency Drugs available, Suction available and Patient being monitored Patient Re-evaluated:Patient Re-evaluated prior to inductionOxygen Delivery Method: Circle System Utilized Preoxygenation: Pre-oxygenation with 100% oxygen Intubation Type: IV induction Ventilation: Mask ventilation without difficulty Laryngoscope Size: Miller and 2 Grade View: Grade I Tube type: Oral Tube size: 7.0 mm Number of attempts: 1 Airway Equipment and Method: Stylet Placement Confirmation: ETT inserted through vocal cords under direct vision,  positive ETCO2 and breath sounds checked- equal and bilateral Secured at: 21 cm Tube secured with: Tape Dental Injury: Teeth and Oropharynx as per pre-operative assessment

## 2017-02-26 NOTE — Anesthesia Preprocedure Evaluation (Addendum)
Anesthesia Evaluation  Patient identified by MRN, date of birth, ID band Patient awake    Reviewed: Allergy & Precautions, NPO status , Patient's Chart, lab work & pertinent test results  History of Anesthesia Complications Negative for: history of anesthetic complications  Airway Mallampati: II  TM Distance: >3 FB Neck ROM: Full    Dental  (+) Teeth Intact, Dental Advisory Given   Pulmonary neg pulmonary ROS,    breath sounds clear to auscultation       Cardiovascular hypertension, Pt. on medications + Valvular Problems/Murmurs MVP  Rhythm:Regular     Neuro/Psych  Headaches,  Neuromuscular disease    GI/Hepatic Neg liver ROS, GERD  Controlled,  Endo/Other  negative endocrine ROS  Renal/GU negative Renal ROS     Musculoskeletal  (+) Arthritis , Fibromyalgia -  Abdominal   Peds  Hematology negative hematology ROS (+)   Anesthesia Other Findings   Reproductive/Obstetrics                            Anesthesia Physical Anesthesia Plan  ASA: II  Anesthesia Plan: General   Post-op Pain Management:    Induction: Intravenous  PONV Risk Score and Plan: 3 and Ondansetron, Dexamethasone, Propofol and Treatment may vary due to age  Airway Management Planned: Oral ETT  Additional Equipment: None  Intra-op Plan:   Post-operative Plan: Extubation in OR  Informed Consent: I have reviewed the patients History and Physical, chart, labs and discussed the procedure including the risks, benefits and alternatives for the proposed anesthesia with the patient or authorized representative who has indicated his/her understanding and acceptance.   Dental advisory given  Plan Discussed with: CRNA and Surgeon  Anesthesia Plan Comments:         Anesthesia Quick Evaluation

## 2017-02-26 NOTE — Anesthesia Postprocedure Evaluation (Signed)
Anesthesia Post Note  Patient: Heather Ramsey  Procedure(s) Performed: Procedure(s) (LRB): Lumbar Four-Five,Lumbar Five-Sacral One Posterior lumbar interbody fusion (N/A)     Patient location during evaluation: PACU Anesthesia Type: General Level of consciousness: awake and alert, patient cooperative and oriented Pain management: pain level controlled Vital Signs Assessment: post-procedure vital signs reviewed and stable Respiratory status: spontaneous breathing, nonlabored ventilation, respiratory function stable and patient connected to nasal cannula oxygen Cardiovascular status: blood pressure returned to baseline and stable Postop Assessment: no signs of nausea or vomiting Anesthetic complications: no    Last Vitals:  Vitals:   02/26/17 1800 02/26/17 1815  BP: 99/62 (!) 94/59  Pulse: 65 65  Resp: 16 14  Temp:  36.5 C    Last Pain:  Vitals:   02/26/17 1815  TempSrc:   PainSc: 1     LLE Motor Response: Purposeful movement;Responds to commands (02/26/17 1815) LLE Sensation: Full sensation;No numbness;No tingling (02/26/17 1815) RLE Motor Response: Purposeful movement;Responds to commands (02/26/17 1815) RLE Sensation: Full sensation;No numbness;No tingling (02/26/17 1815)      Seleta Rhymes. Karsen Fellows

## 2017-02-26 NOTE — Op Note (Signed)
Date of surgery: 02/26/2017 Preoperative diagnosis: Spondylolisthesis L4-5 and L5-S1 with lumbar radiculopathy, lumbar stenosis, neurogenic claudication. Postoperative diagnosis: Same Procedure: Laminectomy L4-L5 decompression of the L4-L5 and S1 nerve roots with more work than required for simple interbody technique. Total discectomy L4-5 and L5-S1 with posterior lumbar interbody arthrodesis using peek spacers local autograft and allograft. Segmental fixation from L4 to the sacrum with pedicle screws posterior lateral arthrodesis with autograft and allograft L4 to sacrum. Surgeon: Kristeen Miss M.D. First assistant: Ashley Jacobs M.D. Anesthesia: Gen. endotracheal Indications: The patient is a 71 year old individual's had significant back and bilateral lower extremity pain for a prolonged period time. She has degenerative spondylolisthesis at L4-5 and L5-S1 has been advised regarding the need for surgical decompression and stabilization of these 2 segments.  Procedure: Patient was brought to the operating room supine on a stretcher. After the smooth induction of general endotracheal anesthesia, she was turned prone. The back was prepped with alcohol DuraPrep and draped in a sterile fashion. Midline incision was created and carried down to the lumbar dorsal fascia. The spinous processes of L4-L5 and the sacrum were then uncovered and identified positively with the radiograph. A subperiosteal dissection was carried out over the facet joints to expose L4-L5 and the sacrum including the transverse processes of L4-L5 and the sacral ala. These areas were packed off in the lateral gutters after being decorticated for later use in grafting. Laminectomy was then created removing the information lamina of L4 out to and including the entirety of the facet joint at L4-L5. The entire laminar arch of L5 was removed. The yellow ligament was then taken down and careful decompression of the L4 the L5 and the S1 nerve  roots was undertaken bilaterally using a 2 and 3 mm Kerrison punch and a high-speed drill. The facets were decorticated at these levels also. Then the disc spaces were isolated once the decompression was completed the disc spaces were entered with a 15 blade and accommodation of curettes rongeurs and disc shavers were used 3 move substantial quantities of severely degenerated and desiccated disc material. At the L4-5 level once the endplates were completely decorticated is decided to implant an 8 peek spacer with 10 mm of height and 25 mm length. A total of 6 mL of bone graft was packed into the interspace also. At L5-S1 after complete discectomy was completed 12 spacers measuring 9 mm in height and 23 mm in length were used to secure the interspace. 6 mL of bone graft was packed into this interspace also. Then the lateral gutters were uncovered and a total of 6 mL of bone graft was packed into either lateral gutter. Pedicle entry sites were then chosen at L4-L5 and the sacrum. 6.5 x 45 mm screws were placed in L4 and L5, 6.5 x 40 mm screws were placed in the sacrum. This was done under fluoroscopic guidance. Precontoured 60 mm rods were used to connect the screws together in the neutral construct. Lateral gutters had already been packed with the remaining of bone graft and then hemostasis was checked in the wound final radiographs were obtained. The pads of the L4 the L5 and the S1 nerve roots and the common dural tube were checked further patency. When this was verified the retractors were removed and the lumbar dorsal fascia was closed with #1 Vicryl in interrupted fashion, 2-0 Vicryl was used in the subcutaneous anus tissues. 3-0 Vicryl was used to close the subcutaneous take her skin. Dermabond was placed on the skin.  Blood loss is estimated at over 500 mL. 200 mL of Cell Saver blood was returned to the patient.

## 2017-02-27 LAB — CBC
HCT: 21 % — ABNORMAL LOW (ref 36.0–46.0)
Hemoglobin: 7.1 g/dL — ABNORMAL LOW (ref 12.0–15.0)
MCH: 30 pg (ref 26.0–34.0)
MCHC: 33.8 g/dL (ref 30.0–36.0)
MCV: 88.6 fL (ref 78.0–100.0)
Platelets: 148 10*3/uL — ABNORMAL LOW (ref 150–400)
RBC: 2.37 MIL/uL — AB (ref 3.87–5.11)
RDW: 12.9 % (ref 11.5–15.5)
WBC: 10 10*3/uL (ref 4.0–10.5)

## 2017-02-27 LAB — BASIC METABOLIC PANEL
Anion gap: 12 (ref 5–15)
BUN: 7 mg/dL (ref 6–20)
CALCIUM: 7.9 mg/dL — AB (ref 8.9–10.3)
CO2: 22 mmol/L (ref 22–32)
Chloride: 100 mmol/L — ABNORMAL LOW (ref 101–111)
Creatinine, Ser: 0.9 mg/dL (ref 0.44–1.00)
GFR calc non Af Amer: >60 mL/min (ref >60)
GLUCOSE: 194 mg/dL — AB (ref 65–99)
POTASSIUM: 3.7 mmol/L (ref 3.5–5.1)
SODIUM: 134 mmol/L — AB (ref 135–145)

## 2017-02-27 LAB — PREPARE RBC (CROSSMATCH)

## 2017-02-27 MED ORDER — HYDROCODONE-ACETAMINOPHEN 5-325 MG PO TABS
1.0000 | ORAL_TABLET | ORAL | Status: DC | PRN
  Administered 2017-02-27 (×3): 1 via ORAL
  Administered 2017-02-27 – 2017-02-28 (×2): 2 via ORAL
  Administered 2017-02-28: 1 via ORAL
  Administered 2017-02-28 – 2017-03-01 (×7): 2 via ORAL
  Filled 2017-02-27: qty 2
  Filled 2017-02-27: qty 1
  Filled 2017-02-27 (×3): qty 2
  Filled 2017-02-27: qty 1
  Filled 2017-02-27: qty 2
  Filled 2017-02-27: qty 1
  Filled 2017-02-27 (×3): qty 2
  Filled 2017-02-27: qty 1
  Filled 2017-02-27: qty 2

## 2017-02-27 MED ORDER — DEXAMETHASONE SODIUM PHOSPHATE 4 MG/ML IJ SOLN
4.0000 mg | Freq: Once | INTRAMUSCULAR | Status: AC
  Administered 2017-02-27: 4 mg via INTRAVENOUS
  Filled 2017-02-27: qty 1

## 2017-02-27 MED ORDER — SODIUM CHLORIDE 0.9 % IV SOLN: Freq: Once | INTRAVENOUS | Status: DC

## 2017-02-27 MED ORDER — METHOCARBAMOL 500 MG PO TABS
500.0000 mg | ORAL_TABLET | Freq: Four times a day (QID) | ORAL | Status: DC | PRN
  Administered 2017-02-28 – 2017-03-01 (×5): 500 mg via ORAL
  Filled 2017-02-27 (×5): qty 1

## 2017-02-27 MED FILL — Thrombin For Soln 5000 Unit: CUTANEOUS | Qty: 5000 | Status: AC

## 2017-02-27 MED FILL — Sodium Chloride IV Soln 0.9%: INTRAVENOUS | Qty: 2000 | Status: AC

## 2017-02-27 MED FILL — Gelatin Absorbable MT Powder: OROMUCOSAL | Qty: 1 | Status: AC

## 2017-02-27 MED FILL — Heparin Sodium (Porcine) Inj 1000 Unit/ML: INTRAMUSCULAR | Qty: 30 | Status: AC

## 2017-02-27 NOTE — Evaluation (Signed)
Occupational Therapy Evaluation Patient Details Name: Heather Ramsey MRN: [id number] DOB: October 12, 1945 Today's Date: 02/27/2017    History of Present Illness Pt is a 71 y/o female who presents s/p L4-S1 PLIF on 02/26/17.    Clinical Impression   PTA, pt was independent and living with her husband. Currently, pt performing ADLs with Min A and functional mobility with Min guard and RW. Provided education on LB dressing techniques with AE; pt demonstrated understanding with Min A. Pt would benefit from further acute OT to facilitate safe dc and increase independence with ADLs and functional mobility. Recommend dc home with initial 24 hour supervision.    Follow Up Recommendations  No OT follow up;Supervision/Assistance - 24 hour    Equipment Recommendations  None recommended by OT (Pt confirms that they have all DME)    Recommendations for Other Services PT consult     Precautions / Restrictions Precautions Precautions: Fall;Back Precaution Booklet Issued: Yes (comment) Precaution Comments: Reviewed precautions verbally throughout functional mobility.  Required Braces or Orthoses: Spinal Brace Spinal Brace: Lumbar corset;Applied in sitting position Restrictions Weight Bearing Restrictions: No      Mobility Bed Mobility Overal bed mobility: Needs Assistance Bed Mobility: Rolling;Sidelying to Sit Rolling: Modified independent (Device/Increase time) Sidelying to sit: Supervision       General bed mobility comments: Pt at EOB with PT upon arrival  Transfers Overall transfer level: Needs assistance Equipment used: Rolling walker (2 wheeled) Transfers: Sit to/from Stand Sit to Stand: Min guard         General transfer comment: VC's for hand placement on seated surface for safety. Was able to power-up to full stand without assist, however close guard provided.     Balance Overall balance assessment: Needs assistance Sitting-balance support: Feet supported;No upper  extremity supported Sitting balance-Leahy Scale: Good     Standing balance support: Bilateral upper extremity supported;During functional activity Standing balance-Leahy Scale: Poor Standing balance comment: Relies on RW for dynamic standing activity.                            ADL either performed or assessed with clinical judgement   ADL Overall ADL's : Needs assistance/impaired Eating/Feeding: Set up;Sitting   Grooming: Min guard;Standing   Upper Body Bathing: Minimal assistance;Sitting   Lower Body Bathing: Minimal assistance;With adaptive equipment;With caregiver independent assisting;Sit to/from stand;Adhering to back precautions   Upper Body Dressing : Minimal assistance;Sitting   Lower Body Dressing: Minimal assistance;With adaptive equipment;Adhering to back precautions;Sit to/from stand Lower Body Dressing Details (indicate cue type and reason): Pt donned underwear with AE and Min VCs for sequencing task with reacher. Min A for standing balance due to pt's fatigue Toilet Transfer: Ambulation;BSC;RW;Min guard Armed forces technical officer Details (indicate cue type and reason): Simulated to recliner         Functional mobility during ADLs: Min guard;Rolling walker       Vision         Perception     Praxis      Pertinent Vitals/Pain Pain Assessment: 0-10 Pain Score: 6  Faces Pain Scale: Hurts even more Pain Location: Incision site Pain Descriptors / Indicators: Operative site guarding Pain Intervention(s): Monitored during session;Limited activity within patient's tolerance;Repositioned     Hand Dominance Right   Extremity/Trunk Assessment Upper Extremity Assessment Upper Extremity Assessment: Overall WFL for tasks assessed   Lower Extremity Assessment Lower Extremity Assessment: Defer to PT evaluation   Cervical / Trunk Assessment Cervical / Trunk  Assessment: Other exceptions Cervical / Trunk Exceptions: s/p surgery   Communication  Communication Communication: No difficulties   Cognition Arousal/Alertness: Awake/alert Behavior During Therapy: WFL for tasks assessed/performed Overall Cognitive Status: Within Functional Limits for tasks assessed                                     General Comments  Pt husband present for session. BP reports feeling "woozy" and BP 88/60. Notified RN    Exercises     Shoulder Instructions      Home Living Family/patient expects to be discharged to:: Private residence Living Arrangements: Spouse/significant other Available Help at Discharge: Family;Available 24 hours/day Type of Home: House Home Access: Stairs to enter [employer] of Steps: 8   Home Layout: Two level;Able to live on main level with bedroom/bathroom     Bathroom Shower/Tub: Occupational psychologist: Standard Bathroom Accessibility: Yes   Home Equipment: Environmental consultant - 2 wheels;Shower seat;Bedside commode;Hand held shower head          Prior Functioning/Environment Level of Independence: Independent                 OT Problem List: Decreased strength;Decreased range of motion;Decreased activity tolerance;Impaired balance (sitting and/or standing);Decreased safety awareness;Decreased knowledge of use of DME or AE;Decreased knowledge of precautions;Pain      OT Treatment/Interventions: Self-care/ADL training;Therapeutic exercise;Energy conservation;DME and/or AE instruction;Patient/family education    OT Goals(Current goals can be found in the care plan section) Acute Rehab OT Goals Patient Stated Goal: Home at d/c OT Goal Formulation: With patient Time For Goal Achievement: 03/13/17 Potential to Achieve Goals: Good ADL Goals Pt Will Perform Lower Body Bathing: with set-up;with supervision;with caregiver independent in assisting;with adaptive equipment;sit to/from stand Pt Will Perform Lower Body Dressing: with set-up;with supervision;with adaptive equipment;with  caregiver independent in assisting;sit to/from stand Pt Will Transfer to Toilet: with set-up;with supervision;ambulating;bedside commode Pt Will Perform Toileting - Clothing Manipulation and hygiene: with set-up;with supervision;sit to/from stand Pt Will Perform Tub/Shower Transfer: Shower transfer;shower seat;ambulating;rolling walker;with min guard assist  OT Frequency: Min 2X/week   Barriers to D/C:            Co-evaluation              AM-PAC PT "6 Clicks" Daily Activity     Outcome Measure Help from another person eating meals?: None Help from another person taking care of personal grooming?: A Little Help from another person toileting, which includes using toliet, bedpan, or urinal?: A Little Help from another person bathing (including washing, rinsing, drying)?: A Little Help from another person to put on and taking off regular upper body clothing?: A Little Help from another person to put on and taking off regular lower body clothing?: A Little 6 Click Score: 19   End of Session Equipment Utilized During Treatment: Gait belt;Rolling walker;Back brace Nurse Communication: Mobility status;Precautions  Activity Tolerance: Patient tolerated treatment well;Patient limited by fatigue;Patient limited by pain Patient left: in chair;with call bell/phone within reach;with family/visitor present  OT Visit Diagnosis: Unsteadiness on feet (R26.81);Other abnormalities of gait and mobility (R26.89);Muscle weakness (generalized) (M62.81);Pain Pain - Right/Left:  (Back) Pain - part of body:  (Back)                Time: 9924-2683 OT Time Calculation (min): 28 min Charges:  OT General Charges $OT Visit: 1 Procedure OT Evaluation $OT Eval Low Complexity: 1  Procedure OT Treatments $Self Care/Home Management : 8-22 mins G-Codes:     [employer], OTR/L [place] 02/27/2017, 9:06 AM

## 2017-02-27 NOTE — Evaluation (Signed)
Physical Therapy Evaluation Patient Details Name: Heather Ramsey MRN: [id number] DOB: [birth year] Today's Date: 02/27/2017   History of Present Illness  Pt is a 71 y/o female who presents s/p L4-S1 PLIF on 02/26/17.   Clinical Impression  Pt admitted with above diagnosis. Pt currently with functional limitations due to the deficits listed below (see PT Problem List). At the time of PT eval pt was able to perform transfers and ambulation with min guard to supervision for safety. Will have husband available 24 hours at d/c, and feel they will be able to manage well at home. Pt with soft BP initially (80's/60's in supine, increasing to 108/69 upon sitting EOB). After gait training, pt reports feeling "woozy" and BP checked, again reading 80's/60's. RN aware. Pt will benefit from skilled PT to increase their independence and safety with mobility to allow discharge to the venue listed below.       Follow Up Recommendations No PT follow up;Supervision for mobility/OOB    Equipment Recommendations  None recommended by PT    Recommendations for Other Services       Precautions / Restrictions Precautions Precautions: Fall;Back Precaution Booklet Issued: Yes (comment) Precaution Comments: Reviewed precautions verbally throughout functional mobility.  Required Braces or Orthoses: Spinal Brace Spinal Brace: Lumbar corset;Applied in sitting position Restrictions Weight Bearing Restrictions: No      Mobility  Bed Mobility Overal bed mobility: Needs Assistance Bed Mobility: Rolling;Sidelying to Sit Rolling: Modified independent (Device/Increase time) Sidelying to sit: Supervision       General bed mobility comments: Increased time and use of rails required. HOB slightly elevated for comfort. Pt was able to transition to full sitting position without assistance.   Transfers Overall transfer level: Needs assistance Equipment used: Rolling walker (2 wheeled) Transfers: Sit to/from  Stand Sit to Stand: Min guard         General transfer comment: VC's for hand placement on seated surface for safety. Was able to power-up to full stand without assist, however close guard provided.   Ambulation/Gait Ambulation/Gait assistance: Min guard;Supervision Ambulation Distance (Feet): 120 Feet Assistive device: Rolling walker (2 wheeled) Gait Pattern/deviations: Step-through pattern;Decreased stride length;Trunk flexed;Narrow base of support Gait velocity: Decreased Gait velocity interpretation: Below normal speed for age/gender General Gait Details: Slow but generally steady with the RW. Min guard provided intitially but once in hall progressed to supervision level. VC's for improved posture and energy conservation.   Stairs            Wheelchair Mobility    Modified Rankin (Stroke Patients Only)       Balance Overall balance assessment: Needs assistance Sitting-balance support: Feet supported;No upper extremity supported Sitting balance-Leahy Scale: Good     Standing balance support: Bilateral upper extremity supported;During functional activity Standing balance-Leahy Scale: Poor Standing balance comment: Relies on RW for dynamic standing activity.                              Pertinent Vitals/Pain Pain Assessment: Faces Faces Pain Scale: Hurts even more Pain Location: Incision site Pain Descriptors / Indicators: Operative site guarding Pain Intervention(s): Limited activity within patient's tolerance;Monitored during session;Repositioned    Home Living Family/patient expects to be discharged to:: Private residence Living Arrangements: Spouse/significant other Available Help at Discharge: Family;Available 24 hours/day Type of Home: House Home Access: Stairs to enter   [employer] of Steps: 8 Home Layout: Two level;Able to live on main level with bedroom/bathroom Home  Equipment: Gilford Rile - 2 wheels;Shower seat      Prior  Function Level of Independence: Independent               Hand Dominance        Extremity/Trunk Assessment   Upper Extremity Assessment Upper Extremity Assessment: Defer to OT evaluation    Lower Extremity Assessment Lower Extremity Assessment: Generalized weakness (Consistent with pre-op diagnosis)    Cervical / Trunk Assessment Cervical / Trunk Assessment: Other exceptions Cervical / Trunk Exceptions: s/p surgery  Communication   Communication: No difficulties  Cognition Arousal/Alertness: Awake/alert Behavior During Therapy: WFL for tasks assessed/performed Overall Cognitive Status: Within Functional Limits for tasks assessed                                        General Comments      Exercises     Assessment/Plan    PT Assessment Patient needs continued PT services  PT Problem List Decreased strength;Decreased range of motion;Decreased activity tolerance;Decreased balance;Decreased mobility;Decreased knowledge of use of DME;Decreased safety awareness;Decreased knowledge of precautions;Pain       PT Treatment Interventions DME instruction;Gait training;Stair training;Therapeutic exercise;Functional mobility training;Therapeutic activities;Neuromuscular re-education;Patient/family education    PT Goals (Current goals can be found in the Care Plan section)  Acute Rehab PT Goals Patient Stated Goal: Home at d/c PT Goal Formulation: With patient/family Time For Goal Achievement: 03/06/17 Potential to Achieve Goals: Good    Frequency Min 5X/week   Barriers to discharge        Co-evaluation               AM-PAC PT "6 Clicks" Daily Activity  Outcome Measure Difficulty turning over in bed (including adjusting bedclothes, sheets and blankets)?: Total Difficulty moving from lying on back to sitting on the side of the bed? : Total Difficulty sitting down on and standing up from a chair with arms (e.g., wheelchair, bedside commode,  etc,.)?: Total Help needed moving to and from a bed to chair (including a wheelchair)?: A Little Help needed walking in hospital room?: A Little Help needed climbing 3-5 steps with a railing? : A Little 6 Click Score: 12    End of Session Equipment Utilized During Treatment: Gait belt;Back brace Activity Tolerance: Patient tolerated treatment well Patient left: in chair;with call bell/phone within reach;with family/visitor present;Other (comment) (OT present) Nurse Communication: Mobility status PT Visit Diagnosis: Pain;Muscle weakness (generalized) (M62.81)    Time: 2778-2423 PT Time Calculation (min) (ACUTE ONLY): 35 min   Charges:   PT Evaluation $PT Eval Moderate Complexity: 1 Procedure PT Treatments $Gait Training: 8-22 mins   PT G Codes:        Rolinda Roan, PT, DPT Acute Rehabilitation Services Pager: [place] 02/27/2017, 8:45 AM

## 2017-02-27 NOTE — Progress Notes (Signed)
Patient ID: Heather Ramsey, female   DOB: 10/04/1945, 71 y.o.   MRN: [id number] BP low this am with patient symptomatic of hypovolemia and anemia secondary to acute blood loss from surgery.  Better now after transfusion of 2 units prbcs for hg of 7.2. Motor function intact. Low grade fever to 100F. Observe.

## 2017-02-28 LAB — HEMOGLOBIN AND HEMATOCRIT, BLOOD
HEMATOCRIT: 28.7 % — AB (ref 36.0–46.0)
Hemoglobin: 9.9 g/dL — ABNORMAL LOW (ref 12.0–15.0)

## 2017-02-28 LAB — TYPE AND SCREEN
ABO/RH(D): A NEG
Antibody Screen: NEGATIVE
UNIT DIVISION: 0
Unit division: 0

## 2017-02-28 LAB — BPAM RBC
BLOOD PRODUCT EXPIRATION DATE: 201806112359
Blood Product Expiration Date: 201806152359
ISSUE DATE / TIME: 201806061042
ISSUE DATE / TIME: 201806061258
UNIT TYPE AND RH: 600
Unit Type and Rh: 600

## 2017-02-28 MED ORDER — METHOCARBAMOL 500 MG PO TABS: 500.0000 mg | ORAL_TABLET | Freq: Four times a day (QID) | ORAL | 3 refills | Status: DC | PRN

## 2017-02-28 MED ORDER — HYDROCODONE-ACETAMINOPHEN 5-325 MG PO TABS: 1.0000 | ORAL_TABLET | ORAL | 0 refills | Status: DC | PRN

## 2017-02-28 NOTE — Progress Notes (Signed)
Patient ID: Heather Ramsey, female   DOB: 1945/10/23, 71 y.o.   MRN: [id number] Vital signs stable Feels better post transfusion. Dressing clean and dry Doing well.

## 2017-02-28 NOTE — Progress Notes (Signed)
Occupational Therapy Treatment Patient Details Name: Heather Ramsey MRN: [id number] DOB: March 02, 1946 Today's Date: 02/28/2017    History of present illness Pt is a 71 y/o female who presents s/p L4-S1 PLIF on 02/26/17.    OT comments  Pt progressing towards OT goals. Pt reports feeling much better after getting blood yesterday. Back handout provided and reviewed adls in detail. Pt educated on: clothing between brace, never sleep in brace, set an alarm at night for medication, avoid sitting for long periods of time, correct bed positioning for sleeping, correct sequence for bed mobility, AE kit, safety in the kitchen, avoiding lifting more than 5 pounds and never wash directly over incision. All education is complete and patient and husband indicates understanding.    Follow Up Recommendations  No OT follow up;Supervision/Assistance - 24 hour    Equipment Recommendations  None recommended by OT    Recommendations for Other Services      Precautions / Restrictions Precautions Precautions: Fall;Back Precaution Booklet Issued: Yes (comment) Precaution Comments: reviewed in full again Required Braces or Orthoses: Spinal Brace Spinal Brace: Lumbar corset;Applied in sitting position Restrictions Weight Bearing Restrictions: No       Mobility Bed Mobility               General bed mobility comments: Pt sitting OOB in recliner when OT entered room  Transfers Overall transfer level: Needs assistance Equipment used: Rolling walker (2 wheeled) Transfers: Sit to/from Stand Sit to Stand: Min guard         General transfer comment: good hand placement this session for sit to stand from recliner    Balance Overall balance assessment: Needs assistance Sitting-balance support: Feet supported;No upper extremity supported Sitting balance-Leahy Scale: Good     Standing balance support: Bilateral upper extremity supported;During functional activity Standing balance-Leahy Scale:  Poor Standing balance comment: Relies on RW for dynamic standing activity.                            ADL either performed or assessed with clinical judgement   ADL Overall ADL's : Needs assistance/impaired Eating/Feeding: Set up;Sitting Eating/Feeding Details (indicate cue type and reason): eating breakfast when OT left session         Lower Body Bathing: Minimal assistance;With adaptive equipment;With caregiver independent assisting;Sit to/from stand;Adhering to back precautions   Upper Body Dressing : Sitting;With caregiver independent assisting;Min guard Upper Body Dressing Details (indicate cue type and reason): Pt and husband feel confident with don/doff of brace Lower Body Dressing: With adaptive equipment;Adhering to back precautions;Sit to/from stand;With caregiver independent assisting;Min guard               Functional mobility during ADLs: Min guard;Rolling walker General ADL Comments: educated on all AE equipment (husband bought AE kit yesterday)     Manufacturing systems engineer      Cognition Arousal/Alertness: Awake/alert Behavior During Therapy: WFL for tasks assessed/performed Overall Cognitive Status: Within Functional Limits for tasks assessed                                          Exercises     Shoulder Instructions       General Comments Pt's husband present for session    Pertinent Vitals/ Pain       Pain  Assessment: Faces Faces Pain Scale: Hurts little more Pain Location: Incision site Pain Descriptors / Indicators: Operative site guarding Pain Intervention(s): Monitored during session;Repositioned  Home Living                                          Prior Functioning/Environment              Frequency  Min 2X/week        Progress Toward Goals  OT Goals(current goals can now be found in the care plan section)  Progress towards OT goals: Progressing toward  goals  Acute Rehab OT Goals Patient Stated Goal: Home at d/c OT Goal Formulation: With patient Time For Goal Achievement: 03/13/17 Potential to Achieve Goals: Good  Plan Discharge plan remains appropriate    Co-evaluation                 AM-PAC PT "6 Clicks" Daily Activity     Outcome Measure   Help from another person eating meals?: None Help from another person taking care of personal grooming?: None Help from another person toileting, which includes using toliet, bedpan, or urinal?: A Little Help from another person bathing (including washing, rinsing, drying)?: A Little Help from another person to put on and taking off regular upper body clothing?: A Little Help from another person to put on and taking off regular lower body clothing?: A Little 6 Click Score: 20    End of Session Equipment Utilized During Treatment: Gait belt;Rolling walker;Back brace  OT Visit Diagnosis: Unsteadiness on feet (R26.81);Other abnormalities of gait and mobility (R26.89);Muscle weakness (generalized) (M62.81);Pain Pain - part of body:  (back)   Activity Tolerance Patient tolerated treatment well   Patient Left in chair;with call bell/phone within reach;with family/visitor present   Nurse Communication Mobility status        Time: 5056-9794 OT Time Calculation (min): 20 min  Charges: OT General Charges $OT Visit: 1 Procedure OT Treatments $Self Care/Home Management : 8-22 mins  Hulda Humphrey OTR/L [place] 02/28/2017, 9:01 AM

## 2017-02-28 NOTE — Progress Notes (Signed)
Physical Therapy Treatment Patient Details Name: Heather Ramsey MRN: [id number] DOB: [birth year] Today's Date: 02/28/2017    History of Present Illness Pt is a 71 y/o female who presents s/p L4-S1 PLIF on 02/26/17.     PT Comments    Pt progressing well towards physical therapy goals. Was able to improve gait training distance as well as completed stair training this session. Will continue to follow and progress as able per POC.    Follow Up Recommendations  No PT follow up;Supervision for mobility/OOB     Equipment Recommendations  None recommended by PT    Recommendations for Other Services       Precautions / Restrictions Precautions Precautions: Fall;Back Precaution Booklet Issued: Yes (comment) Precaution Comments: Pt and husband were educated on precautions during functional mobility.  Required Braces or Orthoses: Spinal Brace Spinal Brace: Lumbar corset;Applied in sitting position Restrictions Weight Bearing Restrictions: No    Mobility  Bed Mobility Overal bed mobility: Needs Assistance Bed Mobility: Rolling;Sidelying to Sit;Sit to Sidelying Rolling: Modified independent (Device/Increase time) Sidelying to sit: Supervision     Sit to sidelying: Supervision General bed mobility comments: Reviewed bed mobility with HOB flat and rails lowered to simulate home environment. Pt was able to complete without assistance, however VC's provided for proper technique.   Transfers Overall transfer level: Needs assistance Equipment used: Rolling walker (2 wheeled) Transfers: Sit to/from Stand Sit to Stand: Min guard         General transfer comment: Pt demonstrated good hand placement on seated surface for safety.   Ambulation/Gait Ambulation/Gait assistance: Supervision Ambulation Distance (Feet): 300 Feet Assistive device: Rolling walker (2 wheeled) Gait Pattern/deviations: Step-through pattern;Decreased stride length;Trunk flexed;Narrow base of support Gait  velocity: Decreased Gait velocity interpretation: Below normal speed for age/gender General Gait Details: Slow but generally steady with the RW. Supervision provided for safety and general maintenance of precautions.    Stairs Stairs: Yes   Stair Management: One rail Left;Step to pattern;Forwards (HHA on R) Number of Stairs: 10 General stair comments: VC's for sequencing and technique. Able to complete full flight with min guard assist for safety.   Wheelchair Mobility    Modified Rankin (Stroke Patients Only)       Balance Overall balance assessment: Needs assistance Sitting-balance support: Feet supported;No upper extremity supported Sitting balance-Leahy Scale: Good     Standing balance support: Bilateral upper extremity supported;During functional activity Standing balance-Leahy Scale: Poor Standing balance comment: Relies on RW for dynamic standing activity.                             Cognition Arousal/Alertness: Awake/alert Behavior During Therapy: WFL for tasks assessed/performed Overall Cognitive Status: Within Functional Limits for tasks assessed                                        Exercises      General Comments General comments (skin integrity, edema, etc.): Pt husband present for session.      Pertinent Vitals/Pain Pain Assessment: Faces Faces Pain Scale: Hurts a little bit Pain Location: Incision site Pain Descriptors / Indicators: Operative site guarding Pain Intervention(s): Limited activity within patient's tolerance;Monitored during session;Repositioned    Home Living                      Prior Function  PT Goals (current goals can now be found in the care plan section) Acute Rehab PT Goals Patient Stated Goal: Home at d/c PT Goal Formulation: With patient/family Time For Goal Achievement: 03/06/17 Potential to Achieve Goals: Good Progress towards PT goals: Progressing toward goals     Frequency    Min 5X/week      PT Plan Current plan remains appropriate    Co-evaluation              AM-PAC PT "6 Clicks" Daily Activity  Outcome Measure  Difficulty turning over in bed (including adjusting bedclothes, sheets and blankets)?: Total Difficulty moving from lying on back to sitting on the side of the bed? : Total Difficulty sitting down on and standing up from a chair with arms (e.g., wheelchair, bedside commode, etc,.)?: Total Help needed moving to and from a bed to chair (including a wheelchair)?: A Little Help needed walking in hospital room?: A Little Help needed climbing 3-5 steps with a railing? : A Little 6 Click Score: 12    End of Session Equipment Utilized During Treatment: Gait belt;Back brace Activity Tolerance: Patient tolerated treatment well Patient left: in chair;with call bell/phone within reach;with family/visitor present Nurse Communication: Mobility status PT Visit Diagnosis: Pain;Muscle weakness (generalized) (M62.81) Pain - part of body:  (back)     Time: 6294-7654 PT Time Calculation (min) (ACUTE ONLY): 24 min  Charges:  $Gait Training: 23-37 mins                    G Codes:       Rolinda Roan, PT, DPT Acute Rehabilitation Services Pager: [place] 02/28/2017, 10:29 AM

## 2017-02-28 NOTE — Discharge Summary (Signed)
Physician Discharge Summary  Patient ID: Heather Ramsey MRN: [id number] DOB/AGE: September 05, 1946 71 y.o.  Admit date: 02/26/2017 Discharge date: 02/28/2017  Admission Diagnoses:Spondylolisthesis and stenosis with radiculopathy L4-5 and L5-S1  Discharge Diagnoses: Spondylolisthesis and stenosis with radiculopathy L4-5 and L5-S1. Acute blood loss anemia. Active Problems:   Spondylolisthesis at L4-L5 level   Discharged Condition: good  Hospital Course: Patient was admitted to undergo surgical decompression and stabilization from L4 to the sacrum. She tolerated surgery well. During the postoperative period it was noted that she was hypotensive. Her hemoglobin was 7.1. She was transfused 2 units of packed red cells and felt immediately better. She improved her level of functioning so that she can be discharged home on the third postoperative day.  Consults: None  Significant Diagnostic Studies: None  Treatments: surgery: Bilateral decompression and fusion L4 to the sacrum  Discharge Exam: Blood pressure 103/66, pulse 87, temperature 98.8 F (37.1 C), resp. rate 16, height 5' 1.5" (1.562 m), weight 58.3 kg (128 lb 8.4 oz), SpO2 98 %. Incision is clean and dry motor function is intact station and gait are intact.  Disposition: 01-Home or Self Care  Discharge Instructions    Call MD for:  redness, tenderness, or signs of infection (pain, swelling, redness, odor or green/yellow discharge around incision site)    Complete by:  As directed    Call MD for:  severe uncontrolled pain    Complete by:  As directed    Call MD for:  temperature >100.4    Complete by:  As directed    Diet - low sodium heart healthy    Complete by:  As directed    Discharge instructions    Complete by:  As directed    Okay to shower. Do not apply salves or appointments to incision. No heavy lifting with the upper extremities greater than 15 pounds. May resume driving when not requiring pain medication and patient  feels comfortable with doing so.   Incentive spirometry RT    Complete by:  As directed    Increase activity slowly    Complete by:  As directed      Allergies as of 02/28/2017      Reactions   Clarithromycin Swelling, Rash   Chest pain, cysts on sclera with jaundice, GERD, vomiting   Clindamycin Swelling   angioedema   Levofloxacin Other (See Comments)   Headache; dysphasia X 30 min   Tramadol Nausea And Vomiting, Swelling   Fainting, Syncope   Actonel [risedronate Sodium] Nausea And Vomiting, Other (See Comments)   Vomiting, migraines   Atorvastatin Other (See Comments)   MUSCLE ACHES MEMORY   Bactroban [mupirocin Calcium] Rash   Risedronate Sodium Other (See Comments)   SEVERE HEADACHES      Medication List    TAKE these medications   amLODipine 5 MG tablet Commonly known as:  NORVASC Take 1.5 tablets (7.5 mg total) by mouth daily.   b complex vitamins tablet Take 1 tablet by mouth daily.   CALCIUM + D PO Take 1 tablet by mouth 2 (two) times daily.   Chromium 200 MCG Tabs Take 1 tablet by mouth daily.   cyclobenzaprine 10 MG tablet Commonly known as:  FLEXERIL Take 5 mg by mouth at bedtime.   diclofenac sodium 1 % Gel Commonly known as:  VOLTAREN Voltaren Gel 3 grams to 3 large joints upto TID 3 TUBES with 3 refills What changed:  how much to take  how to take this  when  to take this  reasons to take this  additional instructions   fish oil-omega-3 fatty acids 1000 MG capsule Take 1-2 g by mouth See admin instructions. 1 cap in the morning, 2 caps in the evening   Ginger Root 550 MG Caps Take 1 capsule by mouth daily.   HYDROcodone-acetaminophen 5-325 MG tablet Commonly known as:  NORCO/VICODIN Take 1 tablet by mouth every 8 (eight) hours as needed for moderate pain. What changed:  Another medication with the same name was added. Make sure you understand how and when to take each.   HYDROcodone-acetaminophen 5-325 MG tablet Commonly known as:   NORCO/VICODIN Take 1-2 tablets by mouth every 3 (three) hours as needed for moderate pain. What changed:  You were already taking a medication with the same name, and this prescription was added. Make sure you understand how and when to take each.   ICAPS AREDS 2 PO Take 1 capsule by mouth daily.   losartan 50 MG tablet Commonly known as:  COZAAR Take 1 tablet (50 mg total) by mouth daily.   magnesium 30 MG tablet Take 30 mg by mouth 2 (two) times daily.   methocarbamol 500 MG tablet Commonly known as:  ROBAXIN Take 500 mg by mouth at bedtime as needed for muscle spasms. What changed:  Another medication with the same name was added. Make sure you understand how and when to take each.   methocarbamol 500 MG tablet Commonly known as:  ROBAXIN Take 1 tablet (500 mg total) by mouth every 6 (six) hours as needed for muscle spasms. What changed:  You were already taking a medication with the same name, and this prescription was added. Make sure you understand how and when to take each.   MOTRIN PO Take 400 mg by mouth at bedtime.   nitrofurantoin 50 MG capsule Commonly known as:  MACRODANTIN Take 50 mg by mouth as needed.   Psyllium 28.3 % Powd Commonly known as:  METAMUCIL Take 1 scoop dissolved in at least 8 ounces water/juice and drink twice daily   RED YEAST RICE PO Take 1 capsule by mouth daily.   TURMERIC PO Take 400 mg by mouth daily.        SignedEarleen Newport 02/28/2017, 7:42 PM

## 2017-03-01 NOTE — Progress Notes (Signed)
Patient alert and oriented, mae's well, voiding adequate amount of urine, swallowing without difficulty, no c/o pain. Patient discharged home with family. Script and discharged instructions given to patient. Patient and family stated understanding of d/c instructions given and has an appointment with Dr. Elsner  

## 2017-03-01 NOTE — Progress Notes (Signed)
Physical Therapy Treatment Patient Details Name: Heather Ramsey MRN: [id number] DOB: 04-11-1946 Today's Date: 03/01/2017    History of Present Illness Pt is a 71 y/o female who presents s/p L4-S1 PLIF on 02/26/17.     PT Comments    Pt progressing towards physical therapy goals. Was able to perform transfers and ambulation with gross min guard assist for safety. Pt was able to ambulate well with minimal pain reported. Pt was educated on precautions, walking program, brace application/wearing schedule, and car transfer. Will continue to follow and progress as able per POC.    Follow Up Recommendations  No PT follow up;Supervision for mobility/OOB     Equipment Recommendations  None recommended by PT    Recommendations for Other Services       Precautions / Restrictions Precautions Precautions: Fall;Back Precaution Booklet Issued: Yes (comment) Precaution Comments: Pt was educated on precautions during functional mobility.  Required Braces or Orthoses: Spinal Brace Spinal Brace: Lumbar corset;Applied in sitting position Restrictions Weight Bearing Restrictions: No    Mobility  Bed Mobility   Bed Mobility: Rolling;Sidelying to Sit Rolling: Modified independent (Device/Increase time) Sidelying to sit: Modified independent (Device/Increase time)       General bed mobility comments: Pt completed transition to EOB with HOB flat and rails lowered to simulate home environment. Demonstrated proper log roll with min cues for improved technique.  Transfers Overall transfer level: Needs assistance Equipment used: Rolling walker (2 wheeled) Transfers: Sit to/from Stand Sit to Stand: Supervision         General transfer comment: Pt demonstrated good hand placement on seated surface for safety.   Ambulation/Gait Ambulation/Gait assistance: Min guard Ambulation Distance (Feet): 300 Feet Assistive device: 1 person hand held assist Gait Pattern/deviations: Step-through  pattern;Decreased stride length;Trunk flexed;Narrow base of support Gait velocity: Decreased Gait velocity interpretation: Below normal speed for age/gender General Gait Details: Slow but generally steady with HHA. Did not require assist however made pt feel for comfortable to have HHA.    Stairs         General stair comments: Pt declined further stair training - states she feels comfortable with completing entrance to home.   Wheelchair Mobility    Modified Rankin (Stroke Patients Only)       Balance Overall balance assessment: Needs assistance Sitting-balance support: Feet supported;No upper extremity supported Sitting balance-Leahy Scale: Good     Standing balance support: Single extremity supported;During functional activity Standing balance-Leahy Scale: Fair Standing balance comment: 1 HHA for safety during dynamic standing activity.                             Cognition Arousal/Alertness: Awake/alert Behavior During Therapy: WFL for tasks assessed/performed Overall Cognitive Status: Within Functional Limits for tasks assessed                                        Exercises      General Comments        Pertinent Vitals/Pain Pain Assessment: Faces Faces Pain Scale: Hurts little more Pain Location: Incision site, L LE Pain Descriptors / Indicators: Operative site guarding;Discomfort Pain Intervention(s): Limited activity within patient's tolerance;Monitored during session;Repositioned    Home Living                      Prior Function  PT Goals (current goals can now be found in the care plan section) Acute Rehab PT Goals Patient Stated Goal: Home at d/c (today) PT Goal Formulation: With patient/family Time For Goal Achievement: 03/06/17 Potential to Achieve Goals: Good Progress towards PT goals: Progressing toward goals    Frequency    Min 5X/week      PT Plan Current plan remains  appropriate    Co-evaluation              AM-PAC PT "6 Clicks" Daily Activity  Outcome Measure  Difficulty turning over in bed (including adjusting bedclothes, sheets and blankets)?: None Difficulty moving from lying on back to sitting on the side of the bed? : None Difficulty sitting down on and standing up from a chair with arms (e.g., wheelchair, bedside commode, etc,.)?: None Help needed moving to and from a bed to chair (including a wheelchair)?: A Little Help needed walking in hospital room?: A Little Help needed climbing 3-5 steps with a railing? : A Little 6 Click Score: 21    End of Session Equipment Utilized During Treatment: Gait belt;Back brace Activity Tolerance: Patient tolerated treatment well Patient left: in chair;with call bell/phone within reach;with family/visitor present Nurse Communication: Mobility status PT Visit Diagnosis: Pain;Muscle weakness (generalized) (M62.81) Pain - Right/Left: Left Pain - part of body: Leg (back)     Time: 3790-2409 PT Time Calculation (min) (ACUTE ONLY): 22 min  Charges:  $Gait Training: 8-22 mins                    G Codes:       Rolinda Roan, PT, DPT Acute Rehabilitation Services Pager: [phone]    Thelma Comp 03/01/2017, 8:51 AM

## 2017-04-04 ENCOUNTER — Telehealth: Payer: Self-pay | Admitting: Internal Medicine

## 2017-04-04 NOTE — Telephone Encounter (Signed)
Called pt to schedule awv. Lvm for pt to call office to schedule appt.  °

## 2017-04-09 ENCOUNTER — Ambulatory Visit (INDEPENDENT_AMBULATORY_CARE_PROVIDER_SITE_OTHER): Payer: PPO | Admitting: Internal Medicine

## 2017-04-09 ENCOUNTER — Encounter: Payer: Self-pay | Admitting: Internal Medicine

## 2017-04-09 ENCOUNTER — Other Ambulatory Visit (INDEPENDENT_AMBULATORY_CARE_PROVIDER_SITE_OTHER): Payer: PPO

## 2017-04-09 VITALS — BP 110/70 | HR 71 | Temp 98.5°F | Ht 61.5 in | Wt 129.2 lb

## 2017-04-09 DIAGNOSIS — I872 Venous insufficiency (chronic) (peripheral): Secondary | ICD-10-CM

## 2017-04-09 DIAGNOSIS — R600 Localized edema: Secondary | ICD-10-CM

## 2017-04-09 DIAGNOSIS — R21 Rash and other nonspecific skin eruption: Secondary | ICD-10-CM

## 2017-04-09 DIAGNOSIS — D539 Nutritional anemia, unspecified: Secondary | ICD-10-CM | POA: Diagnosis not present

## 2017-04-09 LAB — URINALYSIS, ROUTINE W REFLEX MICROSCOPIC
BILIRUBIN URINE: NEGATIVE
Hgb urine dipstick: NEGATIVE
Ketones, ur: NEGATIVE
Nitrite: NEGATIVE
PH: 7.5 (ref 5.0–8.0)
TOTAL PROTEIN, URINE-UPE24: NEGATIVE
Urine Glucose: NEGATIVE
Urobilinogen, UA: 0.2 (ref 0.0–1.0)

## 2017-04-09 LAB — CBC WITH DIFFERENTIAL/PLATELET
Basophils Absolute: 0.1 10*3/uL (ref 0.0–0.1)
Basophils Relative: 0.7 % (ref 0.0–3.0)
EOS PCT: 0.9 % (ref 0.0–5.0)
Eosinophils Absolute: 0.1 10*3/uL (ref 0.0–0.7)
HCT: 35.5 % — ABNORMAL LOW (ref 36.0–46.0)
Hemoglobin: 12 g/dL (ref 12.0–15.0)
LYMPHS ABS: 1.5 10*3/uL (ref 0.7–4.0)
Lymphocytes Relative: 15.3 % (ref 12.0–46.0)
MCHC: 33.8 g/dL (ref 30.0–36.0)
MCV: 88.9 fl (ref 78.0–100.0)
MONO ABS: 0.6 10*3/uL (ref 0.1–1.0)
Monocytes Relative: 6.3 % (ref 3.0–12.0)
NEUTROS PCT: 76.8 % (ref 43.0–77.0)
Neutro Abs: 7.3 10*3/uL (ref 1.4–7.7)
PLATELETS: 326 10*3/uL (ref 150.0–400.0)
RBC: 3.99 Mil/uL (ref 3.87–5.11)
RDW: 14.3 % (ref 11.5–15.5)
WBC: 9.5 10*3/uL (ref 4.0–10.5)

## 2017-04-09 LAB — COMPREHENSIVE METABOLIC PANEL
ALK PHOS: 78 U/L (ref 39–117)
ALT: 11 U/L (ref 0–35)
AST: 16 U/L (ref 0–37)
Albumin: 4.3 g/dL (ref 3.5–5.2)
BILIRUBIN TOTAL: 0.3 mg/dL (ref 0.2–1.2)
BUN: 12 mg/dL (ref 6–23)
CO2: 27 mEq/L (ref 19–32)
Calcium: 9.6 mg/dL (ref 8.4–10.5)
Chloride: 96 mEq/L (ref 96–112)
Creatinine, Ser: 0.66 mg/dL (ref 0.40–1.20)
GFR: 93.96 mL/min (ref >60.00)
GLUCOSE: 107 mg/dL — AB (ref 70–99)
Potassium: 4 mEq/L (ref 3.5–5.1)
SODIUM: 131 meq/L — AB (ref 135–145)
TOTAL PROTEIN: 6.8 g/dL (ref 6.0–8.3)

## 2017-04-09 LAB — SEDIMENTATION RATE: SED RATE: 4 mm/h (ref 0–30)

## 2017-04-09 LAB — VITAMIN B12: Vitamin B-12: 381 pg/mL (ref 211–911)

## 2017-04-09 LAB — FERRITIN: FERRITIN: 103.1 ng/mL (ref 10.0–291.0)

## 2017-04-09 LAB — FOLATE: Folate: 13.3 ng/mL (ref >5.9)

## 2017-04-09 LAB — IBC PANEL
IRON: 30 ug/dL — AB (ref 42–145)
SATURATION RATIOS: 8.6 % — AB (ref 20.0–50.0)
TRANSFERRIN: 248 mg/dL (ref 212.0–360.0)

## 2017-04-09 MED ORDER — VASCULERA PO TABS: 1.0000 | ORAL_TABLET | Freq: Every day | ORAL | 11 refills | Status: DC

## 2017-04-09 MED ORDER — FERROUS SULFATE 325 (65 FE) MG PO TABS: 325.0000 mg | ORAL_TABLET | Freq: Every day | ORAL | 1 refills | Status: DC

## 2017-04-09 MED ORDER — CLOBETASOL PROPIONATE 0.05 % EX OINT: 1.0000 "application " | TOPICAL_OINTMENT | Freq: Two times a day (BID) | CUTANEOUS | 1 refills | Status: DC

## 2017-04-09 NOTE — Progress Notes (Signed)
Subjective:  Patient ID: Heather Ramsey, female    DOB: [birth year]  Age: 71 y.o. MRN: [id number]  CC: Rash; Hypertension; and Anemia  NEW TO ME  HPI Heather Ramsey presents for a 2 day history of rash over both lower extremities. She describes red areas over the medial surfaces between her knees and her ankles. She says the areas sting and burn but do not itch. She complains of chronic lower extremity edema as well. She takes gabapentin and a calcium channel blocker. She is 6 weeks status post lumbar surgery. Her pain is much better and she denies numbness, weakness, tingling in her lower extremities. She tells me she lost a lot of blood during surgery and experience postop anemia.  Outpatient Medications Prior to Visit  Medication Sig Dispense Refill  . b complex vitamins tablet Take 1 tablet by mouth daily.      . Calcium Carbonate-Vitamin D (CALCIUM + D PO) Take 1 tablet by mouth 2 (two) times daily.     . Chromium 200 MCG TABS Take 1 tablet by mouth daily.    . cyclobenzaprine (FLEXERIL) 10 MG tablet Take 5 mg by mouth at bedtime.     . diclofenac sodium (VOLTAREN) 1 % GEL Voltaren Gel 3 grams to 3 large joints upto TID 3 TUBES with 3 refills (Patient taking differently: Apply 1 application topically 3 (three) times daily as needed. Voltaren Gel 3 grams to 3 large joints upto TID 3 TUBES with 3 refills) 3 Tube 3  . fish oil-omega-3 fatty acids 1000 MG capsule Take 1-2 g by mouth See admin instructions. 1 cap in the morning, 2 caps in the evening    . Ginger, Zingiber officinalis, (GINGER ROOT) 550 MG CAPS Take 1 capsule by mouth daily.     Marland Kitchen HYDROcodone-acetaminophen (NORCO/VICODIN) 5-325 MG tablet Take 1 tablet by mouth every 8 (eight) hours as needed for moderate pain. 40 tablet 0  . HYDROcodone-acetaminophen (NORCO/VICODIN) 5-325 MG tablet Take 1-2 tablets by mouth every 3 (three) hours as needed for moderate pain. 60 tablet 0  . Ibuprofen (MOTRIN PO) Take 400 mg by mouth at  bedtime.     Marland Kitchen losartan (COZAAR) 50 MG tablet Take 1 tablet (50 mg total) by mouth daily. 90 tablet 3  . magnesium 30 MG tablet Take 30 mg by mouth 2 (two) times daily.      . methocarbamol (ROBAXIN) 500 MG tablet Take 500 mg by mouth at bedtime as needed for muscle spasms.     . methocarbamol (ROBAXIN) 500 MG tablet Take 1 tablet (500 mg total) by mouth every 6 (six) hours as needed for muscle spasms. 40 tablet 3  . Multiple Vitamins-Minerals (ICAPS AREDS 2 PO) Take 1 capsule by mouth daily.    . nitrofurantoin (MACRODANTIN) 50 MG capsule Take 50 mg by mouth as needed.    . Psyllium (METAMUCIL) 28.3 % POWD Take 1 scoop dissolved in at least 8 ounces water/juice and drink twice daily 1 Bottle 0  . Red Yeast Rice Extract (RED YEAST RICE PO) Take 1 capsule by mouth daily.     . TURMERIC PO Take 400 mg by mouth daily.    Marland Kitchen amLODipine (NORVASC) 5 MG tablet Take 1.5 tablets (7.5 mg total) by mouth daily. 135 tablet 3   No facility-administered medications prior to visit.     ROS Review of Systems  Constitutional: Negative for chills, diaphoresis, fatigue and fever.  HENT: Negative.  Negative for trouble swallowing.  Eyes: Negative for visual disturbance.  Respiratory: Negative for cough, chest tightness, shortness of breath and wheezing.   Cardiovascular: Positive for leg swelling. Negative for chest pain and palpitations.  Gastrointestinal: Positive for constipation. Negative for abdominal pain, blood in stool, diarrhea, nausea and vomiting.  Endocrine: Negative.   Genitourinary: Negative.  Negative for decreased urine volume, difficulty urinating, dysuria, hematuria and urgency.  Musculoskeletal: Negative.  Negative for gait problem and myalgias.  Skin: Positive for color change and rash. Negative for pallor and wound.  Allergic/Immunologic: Negative.   Neurological: Negative.  Negative for dizziness, weakness, light-headedness and headaches.  Hematological: Negative for adenopathy. Does  not bruise/bleed easily.  Psychiatric/Behavioral: Negative.     Objective:  BP 110/70 (BP Location: Left Arm, Patient Position: Sitting, Cuff Size: Normal)   Pulse 71   Temp 98.5 F (36.9 C) (Oral)   Ht 5' 1.5" (1.562 m)   Wt 129 lb 4 oz (58.6 kg)   BMI 24.03 kg/m   BP Readings from Last 3 Encounters:  04/09/17 110/70  03/01/17 101/65  02/20/17 (!) 145/77    Wt Readings from Last 3 Encounters:  04/09/17 129 lb 4 oz (58.6 kg)  02/26/17 128 lb 8.4 oz (58.3 kg)  02/20/17 128 lb 8.5 oz (58.3 kg)    Physical Exam  Constitutional: She is oriented to person, place, and time. No distress.  HENT:  Mouth/Throat: Oropharynx is clear and moist. No oropharyngeal exudate.  Eyes: Conjunctivae are normal. Right eye exhibits no discharge. Left eye exhibits no discharge. No scleral icterus.  Neck: Normal range of motion. Neck supple. No JVD present. No thyromegaly present.  Cardiovascular: Normal rate, regular rhythm and intact distal pulses.  Exam reveals no gallop and no friction rub.   No murmur heard. Pulmonary/Chest: Effort normal and breath sounds normal. No respiratory distress. She has no wheezes. She has no rales. She exhibits no tenderness.  Abdominal: Soft. Bowel sounds are normal. She exhibits no distension and no mass. There is no tenderness. There is no rebound and no guarding.  Musculoskeletal: Normal range of motion. She exhibits edema (2+ pitting edema over BLE's). She exhibits no tenderness or deformity.  Lymphadenopathy:    She has no cervical adenopathy.  Neurological: She is alert and oriented to person, place, and time. She has normal reflexes. She displays normal reflexes. No cranial nerve deficit. She exhibits normal muscle tone. Coordination normal.  Skin: Skin is warm and dry. Rash noted. She is not diaphoretic. There is erythema. No pallor.  Over both lower extremities, symmetrically and medially, between the knees and ankles, There are patches of erythema. Mostly  round but there are some serpiginous borders. There is no tenderness, warmth, ulcers or wounds. There are no vesicles, induration, or fluctuance.  Vitals reviewed.   Lab Results  Component Value Date   WBC 9.5 04/09/2017   HGB 12.0 04/09/2017   HCT 35.5 (L) 04/09/2017   PLT 326.0 04/09/2017   GLUCOSE 107 (H) 04/09/2017   CHOL 273 (H) 11/26/2016   TRIG 71.0 11/26/2016   HDL 80.90 11/26/2016   LDLDIRECT 186.4 10/21/2013   LDLCALC 178 (H) 11/26/2016   ALT 11 04/09/2017   AST 16 04/09/2017   NA 131 (L) 04/09/2017   K 4.0 04/09/2017   CL 96 04/09/2017   CREATININE 0.66 04/09/2017   BUN 12 04/09/2017   CO2 27 04/09/2017   TSH 3.17 11/26/2016   HGBA1C 5.4 11/26/2006    No results found.  Assessment & Plan:   Frankie  was seen today for rash and hypertension.  Diagnoses and all orders for this visit:  Rash- her labs are negative for any concerns about vasculitis or inflammation. Will treat for venous stasis dermatitis. -     Comprehensive metabolic panel; Future -     Sedimentation rate; Future  Deficiency anemia- her H&H have improved but her iron level is low so I've asked her to start iron replacement therapy. -     CBC with Differential/Platelet; Future -     IBC panel; Future -     Vitamin B12; Future -     Folate; Future -     Ferritin; Future -     ferrous sulfate 325 (65 FE) MG tablet; Take 1 tablet (325 mg total) by mouth daily with breakfast.  Localized edema- I have advised her to discontinue the CCB and gabapentin. -     Comprehensive metabolic panel; Future -     Urinalysis, Routine w reflex microscopic; Future  Venous stasis dermatitis of both lower extremities- will treat with a potent topical steroid and Vasculera. -     clobetasol ointment (TEMOVATE) 0.05 %; Apply 1 application topically 2 (two) times daily. -     Dietary Management Product (VASCULERA) TABS; Take 1 capsule by mouth daily.   I have discontinued Ms. Caddell's amLODipine. I am also  having her start on clobetasol ointment, VASCULERA, and ferrous sulfate. Additionally, I am having her maintain her b complex vitamins, fish oil-omega-3 fatty acids, magnesium, Red Yeast Rice Extract (RED YEAST RICE PO), Ginger Root, TURMERIC PO, Calcium Carbonate-Vitamin D (CALCIUM + D PO), nitrofurantoin, Psyllium, diclofenac sodium, cyclobenzaprine, losartan, HYDROcodone-acetaminophen, methocarbamol, Ibuprofen (MOTRIN PO), Chromium, Multiple Vitamins-Minerals (ICAPS AREDS 2 PO), HYDROcodone-acetaminophen, and methocarbamol.  Meds ordered this encounter  Medications  . clobetasol ointment (TEMOVATE) 0.05 %    Sig: Apply 1 application topically 2 (two) times daily.    Dispense:  45 g    Refill:  1  . Dietary Management Product (VASCULERA) TABS    Sig: Take 1 capsule by mouth daily.    Dispense:  30 tablet    Refill:  11  . ferrous sulfate 325 (65 FE) MG tablet    Sig: Take 1 tablet (325 mg total) by mouth daily with breakfast.    Dispense:  180 tablet    Refill:  1     Follow-up: Return in about 3 weeks (around 04/30/2017).  Scarlette Calico, MD

## 2017-04-09 NOTE — Patient Instructions (Signed)

## 2017-04-10 ENCOUNTER — Telehealth: Payer: Self-pay | Admitting: Internal Medicine

## 2017-04-10 NOTE — Telephone Encounter (Signed)
This pt was here to see Dr Ronnald Ramp yesterday. She said that she forgot to mention that she is taking Gabapentin and wanted to make sure that it would not interact with anything that she was prescribed yesterday.

## 2017-04-10 NOTE — Telephone Encounter (Signed)
This can make the edema in her legs worse

## 2017-04-10 NOTE — Telephone Encounter (Signed)
Spoke with pt to inform.  

## 2017-04-10 NOTE — Telephone Encounter (Signed)
Yes, I think it is a good idea to try what he has recommended and see if it helps.

## 2017-04-10 NOTE — Telephone Encounter (Signed)
Please advise 

## 2017-04-10 NOTE — Telephone Encounter (Addendum)
Pt was here to see Dr Ronnald Ramp yesterday for shingles. She said that she wanted to make sure that Dr Quay Burow was okay with everything that Dr Ronnald Ramp did for her while she was here. She seemed a bit concerned but I am not sure what about.

## 2017-04-11 NOTE — Telephone Encounter (Signed)
Pt informed of potential edema in LE.

## 2017-04-19 ENCOUNTER — Encounter: Payer: Self-pay | Admitting: Internal Medicine

## 2017-04-30 DIAGNOSIS — M25472 Effusion, left ankle: Secondary | ICD-10-CM | POA: Diagnosis not present

## 2017-04-30 DIAGNOSIS — M12272 Villonodular synovitis (pigmented), left ankle and foot: Secondary | ICD-10-CM | POA: Diagnosis not present

## 2017-04-30 DIAGNOSIS — M948X7 Other specified disorders of cartilage, ankle and foot: Secondary | ICD-10-CM | POA: Diagnosis not present

## 2017-05-02 ENCOUNTER — Encounter: Payer: Self-pay | Admitting: Internal Medicine

## 2017-05-02 ENCOUNTER — Ambulatory Visit: Payer: PPO

## 2017-05-02 ENCOUNTER — Ambulatory Visit (INDEPENDENT_AMBULATORY_CARE_PROVIDER_SITE_OTHER): Payer: PPO | Admitting: Internal Medicine

## 2017-05-02 VITALS — BP 118/74 | HR 62 | Temp 98.0°F | Resp 16 | Ht 62.0 in | Wt 130.0 lb

## 2017-05-02 DIAGNOSIS — H6121 Impacted cerumen, right ear: Secondary | ICD-10-CM | POA: Insufficient documentation

## 2017-05-02 DIAGNOSIS — I10 Essential (primary) hypertension: Secondary | ICD-10-CM | POA: Diagnosis not present

## 2017-05-02 DIAGNOSIS — Z0000 Encounter for general adult medical examination without abnormal findings: Secondary | ICD-10-CM | POA: Diagnosis not present

## 2017-05-02 DIAGNOSIS — R600 Localized edema: Secondary | ICD-10-CM

## 2017-05-02 NOTE — Patient Instructions (Signed)
Continue doing brain stimulating activities (puzzles, reading, adult coloring books, staying active) to keep memory sharp.   Continue to eat heart healthy diet (full of fruits, vegetables, whole grains, lean protein, water--limit salt, fat, and sugar intake) and increase physical activity as tolerated.   Heather Ramsey , Thank you for taking time to come for your Medicare Wellness Visit. I appreciate your ongoing commitment to your health goals. Please review the following plan we discussed and let me know if I can assist you in the future.   These are the goals we discussed: Goals    . Increase my physical activity as tolerated after back surgery.          Continue to be as healthy as possible, enjoy life, nature, family and church.       This is a list of the screening recommended for you and due dates:  Health Maintenance  Topic Date Due  . Flu Shot  04/24/2017  . Tetanus Vaccine  12/31/2017  . Mammogram  07/31/2018  . Colon Cancer Screening  08/05/2019  . DEXA scan (bone density measurement)  Completed  .  Hepatitis C: One time screening is recommended by Center for Disease Control  (CDC) for  adults born from [birth year] through 1964.   Completed  . Pneumonia vaccines  Completed

## 2017-05-02 NOTE — Progress Notes (Addendum)
Subjective:   Heather Ramsey is a 71 y.o. female who presents for Medicare Annual (Subsequent) preventive examination.  Review of Systems:  No ROS.  Medicare Wellness Visit. Additional risk factors are reflected in the social history.  Cardiac Risk Factors include: advanced age (>68men, >48 women);dyslipidemia Sleep patterns: feels rested on waking, gets up 1 times nightly to void and sleeps 6-7 hours nightly.    Home Safety/Smoke Alarms: Feels safe in home. Smoke alarms in place.  Living environment; residence and Firearm Safety: 2-story house, no firearms. Lives with husband, no needs for DME, good support system Seat Belt Safety/Bike Helmet: Wears seat belt.   Counseling:   Eye Exam- appointment yearly Dental- appointment every 6 months   Female:   Pap- N/A      Mammo- Last 07/31/16,  BI-RADS category 1: negative      Dexa scan- Last 07/31/16    CCS- Last 08/04/14, recall 5 years     Objective:     Vitals: BP 118/74   Pulse 62   Temp 98 F (36.7 C)   Resp 20   Ht 5\' 2"  (1.575 m)   Wt 130 lb (59 kg)   SpO2 98%   BMI 23.78 kg/m   Body mass index is 23.78 kg/m.   Tobacco History  Smoking Status  . Never Smoker  Smokeless Tobacco  . Never Used     Counseling given: Not Answered   Past Medical History:  Diagnosis Date  . Allergy   . Atrophic vaginitis   . Chest pain    negative  cardiac evaluation; Dr Percival Spanish  . Diverticulosis of colon 2005& 2010   FH colon cancer  . Fibromyalgia    Dr Estanislado Pandy  . GERD (gastroesophageal reflux disease)   . Heart murmur   . Hemorrhoid   . Hyperlipidemia   . Hypertension   . IBS (irritable bowel syndrome)   . Migraine   . MVP (mitral valve prolapse)   . Osteopenia   . Pulmonary nodule 07/2006  . UTI (lower urinary tract infection) 2005   Citrobacter koseri, hospitalized w/ kidney infection 1983   Past Surgical History:  Procedure Laterality Date  . back surgy  03/30/2017   lumbar 4-5  . BREAST BIOPSY  Left 2003  . [place]   with bladder tack   . COLONOSCOPY  07/2014   negative; Dr Olevia Perches  . FOOT SURGERY Left 2014   Family History  Problem Relation Age of Onset  . Heart attack Father 57       Died with multiple medical problems  . Colon cancer Father 84  . Diabetes Father   . Tuberculosis Paternal Grandmother   . Stroke Mother 65  . Hypothyroidism Mother   . Hypertension Mother   . Hypothyroidism Sister   . Hypertension Sister   . Breast cancer Sister   . Diabetes Sister   . Heart attack Sister        2 sisters > 43   History  Sexual Activity  . Sexual activity: Yes  . Birth control/ protection: Post-menopausal    Outpatient Encounter Prescriptions as of 05/02/2017  Medication Sig  . amLODipine (NORVASC) 5 MG tablet   . b complex vitamins tablet Take 1 tablet by mouth daily.    . Calcium Carbonate-Vitamin D (CALCIUM + D PO) Take 1 tablet by mouth 2 (two) times daily.   . Chromium 200 MCG TABS Take 1 tablet by mouth daily.  . diclofenac sodium (  VOLTAREN) 1 % GEL Voltaren Gel 3 grams to 3 large joints upto TID 3 TUBES with 3 refills (Patient taking differently: Apply 1 application topically 3 (three) times daily as needed. Voltaren Gel 3 grams to 3 large joints upto TID 3 TUBES with 3 refills)  . Dietary Management Product (VASCULERA) TABS Take 1 capsule by mouth daily.  . ferrous sulfate 325 (65 FE) MG tablet Take 1 tablet (325 mg total) by mouth daily with breakfast.  . fish oil-omega-3 fatty acids 1000 MG capsule Take 1-2 g by mouth See admin instructions. 1 cap in the morning, 2 caps in the evening  . gabapentin (NEURONTIN) 300 MG capsule   . Ginger, Zingiber officinalis, (GINGER ROOT) 550 MG CAPS Take 1 capsule by mouth daily.   Marland Kitchen losartan (COZAAR) 50 MG tablet Take 1 tablet (50 mg total) by mouth daily.  . magnesium 30 MG tablet Take 30 mg by mouth 2 (two) times daily.    . Multiple Vitamins-Minerals (ICAPS AREDS 2 PO) Take 1 capsule by mouth daily.  .  Psyllium (METAMUCIL) 28.3 % POWD Take 1 scoop dissolved in at least 8 ounces water/juice and drink twice daily  . TURMERIC PO Take 400 mg by mouth daily.  . [DISCONTINUED] clobetasol ointment (TEMOVATE) 0.24 % Apply 1 application topically 2 (two) times daily. (Patient not taking: Reported on 05/02/2017)  . [DISCONTINUED] cyclobenzaprine (FLEXERIL) 10 MG tablet Take 5 mg by mouth at bedtime.   . [DISCONTINUED] HYDROcodone-acetaminophen (NORCO/VICODIN) 5-325 MG tablet Take 1 tablet by mouth every 8 (eight) hours as needed for moderate pain. (Patient not taking: Reported on 05/02/2017)  . [DISCONTINUED] HYDROcodone-acetaminophen (NORCO/VICODIN) 5-325 MG tablet Take 1-2 tablets by mouth every 3 (three) hours as needed for moderate pain. (Patient not taking: Reported on 05/02/2017)  . [DISCONTINUED] Ibuprofen (MOTRIN PO) Take 400 mg by mouth at bedtime.   . [DISCONTINUED] methocarbamol (ROBAXIN) 500 MG tablet Take 500 mg by mouth at bedtime as needed for muscle spasms.   . [DISCONTINUED] methocarbamol (ROBAXIN) 500 MG tablet Take 1 tablet (500 mg total) by mouth every 6 (six) hours as needed for muscle spasms. (Patient not taking: Reported on 05/02/2017)  . [DISCONTINUED] nitrofurantoin (MACRODANTIN) 50 MG capsule Take 50 mg by mouth as needed.  . [DISCONTINUED] Red Yeast Rice Extract (RED YEAST RICE PO) Take 1 capsule by mouth daily.    No facility-administered encounter medications on file as of 05/02/2017.     Activities of Daily Living In your present state of health, do you have any difficulty performing the following activities: 05/02/2017 02/20/2017  Hearing? N N  Vision? N N  Difficulty concentrating or making decisions? N N  Walking or climbing stairs? N Y  Dressing or bathing? N N  Doing errands, shopping? N N  Preparing Food and eating ? N -  Using the Toilet? N -  In the past six months, have you accidently leaked urine? N -  Do you have problems with loss of bowel control? N -  Managing your  Medications? N -  Managing your Finances? N -  Housekeeping or managing your Housekeeping? N -  Some recent data might be hidden    Patient Care Team: Binnie Rail, MD as PCP - General (Internal Medicine) Kristeen Miss, MD as Consulting Physician (Neurosurgery)    Assessment:    Physical assessment deferred to PCP.  Exercise Activities and Dietary recommendations Current Exercise Habits: Home exercise routine, Type of exercise: walking, Time (Minutes): 40, Frequency (Times/Week): 3, Weekly  Exercise (Minutes/Week): 120, Intensity: Mild, Exercise limited by: None identified  Diet (meal preparation, eat out, water intake, caffeinated beverages, dairy products, fruits and vegetables): in general, a "healthy" diet  , well balanced, eats a variety of fruits and vegetables daily, limits salt, fat/cholesterol, sugar, caffeine, drinks 6-8 glasses of water daily.   Goals    . Increase my physical activity as tolerated after back surgery.          Continue to be as healthy as possible, enjoy life, nature, family and church.      Fall Risk Fall Risk  05/02/2017 11/26/2016 10/27/2014 10/21/2013 10/08/2012  Falls in the past year? No No No No No   Depression Screen PHQ 2/9 Scores 05/02/2017 11/26/2016 10/27/2014 10/21/2013  PHQ - 2 Score 0 0 0 0  PHQ- 9 Score 1 - - -     Cognitive Function       Ad8 score reviewed for issues:  Issues making decisions: no  Less interest in hobbies / activities: no  Repeats questions, stories (family complaining): no  Trouble using ordinary gadgets (microwave, computer, phone):no  Forgets the month or year: no  Mismanaging finances: no  Remembering appts: no  Daily problems with thinking and/or memory: no Ad8 score is= 0    Immunization History  Administered Date(s) Administered  . Influenza Whole 07/09/2007, 06/21/2010, 07/25/2012, 07/24/2013  . Influenza-Unspecified 07/03/2016  . Pneumococcal Conjugate-13 11/15/2015  . Pneumococcal  Polysaccharide-23 05/27/2008, 10/21/2013  . Td 01/01/2008  . Zoster 09/24/2008   Screening Tests Health Maintenance  Topic Date Due  . INFLUENZA VACCINE  04/24/2017  . TETANUS/TDAP  12/31/2017  . MAMMOGRAM  07/31/2018  . COLONOSCOPY  08/05/2019  . DEXA SCAN  Completed  . Hepatitis C Screening  Completed  . PNA vac Low Risk Adult  Completed      Plan:    Continue doing brain stimulating activities (puzzles, reading, adult coloring books, staying active) to keep memory sharp.   Continue to eat heart healthy diet (full of fruits, vegetables, whole grains, lean protein, water--limit salt, fat, and sugar intake) and increase physical activity as tolerated.  I have personally reviewed and noted the following in the patient's chart:   . Medical and social history . Use of alcohol, tobacco or illicit drugs  . Current medications and supplements . Functional ability and status . Nutritional status . Physical activity . Advanced directives . List of other physicians . Vitals . Screenings to include cognitive, depression, and falls . Referrals and appointments  In addition, I have reviewed and discussed with patient certain preventive protocols, quality metrics, and best practice recommendations. A written personalized care plan for preventive services as well as general preventive health recommendations were provided to patient.     Michiel Cowboy, RN  05/02/2017  Medical screening examination/treatment/procedure(s) were performed by non-physician practitioner and as supervising physician I was immediately available for consultation/collaboration. I agree with above. Scarlette Calico, MD   Medical screening examination/treatment/procedure(s) were performed by non-physician practitioner and as supervising physician I was immediately available for consultation/collaboration. I agree with above. Binnie Rail, MD

## 2017-05-02 NOTE — Progress Notes (Deleted)
Pre visit review using our clinic review tool, if applicable. No additional management support is needed unless otherwise documented below in the visit note. 

## 2017-05-02 NOTE — Progress Notes (Deleted)
Subjective:   Heather Ramsey is a 71 y.o. female who presents for Medicare Annual (Subsequent) preventive examination.  Review of Systems:  No ROS.  Medicare Wellness Visit. Additional risk factors are reflected in the social history.    Sleep patterns: {SX; SLEEP PATTERNS:18802::"feels rested on waking","does not get up to void","gets up *** times nightly to void","sleeps *** hours nightly"}.    Home Safety/Smoke Alarms: Feels safe in home. Smoke alarms in place.  Living environment; residence and Firearm Safety: {Rehab home environment / accessibility:30080::"no firearms","firearms stored safely"}. Seat Belt Safety/Bike Helmet: Wears seat belt.   Counseling:   Eye Exam-  Dental-  Female:   Pap- N/A     Mammo- Last 07/31/16, no results data      Dexa scan- Last 07/03/11, osteopenic      CCS- Last 08/04/14, recall 5 years     Objective:     Vitals: There were no vitals taken for this visit.  There is no height or weight on file to calculate BMI.   Tobacco History  Smoking Status  . Never Smoker  Smokeless Tobacco  . Never Used     Counseling given: Not Answered   Past Medical History:  Diagnosis Date  . Allergy   . Atrophic vaginitis   . Chest pain    negative  cardiac evaluation; Dr Percival Spanish  . Diverticulosis of colon 2005& 2010   FH colon cancer  . Fibromyalgia    Dr Estanislado Pandy  . GERD (gastroesophageal reflux disease)   . Heart murmur   . Hemorrhoid   . Hyperlipidemia   . Hypertension   . IBS (irritable bowel syndrome)   . Migraine   . MVP (mitral valve prolapse)   . Osteopenia   . Pulmonary nodule 07/2006  . UTI (lower urinary tract infection) 2005   Citrobacter koseri, hospitalized w/ kidney infection 1983   Past Surgical History:  Procedure Laterality Date  . BREAST BIOPSY Left 2003  . [place]   with bladder tack   . COLONOSCOPY  07/2014   negative; Dr Olevia Perches  . FOOT SURGERY Left 2014   Family History  Problem  Relation Age of Onset  . Heart attack Father 63       Died with multiple medical problems  . Colon cancer Father 8  . Diabetes Father   . Tuberculosis Paternal Grandmother   . Stroke Mother 33  . Hypothyroidism Mother   . Hypertension Mother   . Hypothyroidism Sister   . Hypertension Sister   . Breast cancer Sister   . Diabetes Sister   . Heart attack Sister        2 sisters > 38   History  Sexual Activity  . Sexual activity: Yes  . Birth control/ protection: Post-menopausal    Outpatient Encounter Prescriptions as of 05/02/2017  Medication Sig  . b complex vitamins tablet Take 1 tablet by mouth daily.    . Calcium Carbonate-Vitamin D (CALCIUM + D PO) Take 1 tablet by mouth 2 (two) times daily.   . Chromium 200 MCG TABS Take 1 tablet by mouth daily.  . clobetasol ointment (TEMOVATE) 6.94 % Apply 1 application topically 2 (two) times daily.  . cyclobenzaprine (FLEXERIL) 10 MG tablet Take 5 mg by mouth at bedtime.   . diclofenac sodium (VOLTAREN) 1 % GEL Voltaren Gel 3 grams to 3 large joints upto TID 3 TUBES with 3 refills (Patient taking differently: Apply 1 application topically 3 (three) times daily as  needed. Voltaren Gel 3 grams to 3 large joints upto TID 3 TUBES with 3 refills)  . Dietary Management Product (VASCULERA) TABS Take 1 capsule by mouth daily.  . ferrous sulfate 325 (65 FE) MG tablet Take 1 tablet (325 mg total) by mouth daily with breakfast.  . fish oil-omega-3 fatty acids 1000 MG capsule Take 1-2 g by mouth See admin instructions. 1 cap in the morning, 2 caps in the evening  . Ginger, Zingiber officinalis, (GINGER ROOT) 550 MG CAPS Take 1 capsule by mouth daily.   Marland Kitchen HYDROcodone-acetaminophen (NORCO/VICODIN) 5-325 MG tablet Take 1 tablet by mouth every 8 (eight) hours as needed for moderate pain.  Marland Kitchen HYDROcodone-acetaminophen (NORCO/VICODIN) 5-325 MG tablet Take 1-2 tablets by mouth every 3 (three) hours as needed for moderate pain.  . Ibuprofen (MOTRIN PO) Take 400  mg by mouth at bedtime.   Marland Kitchen losartan (COZAAR) 50 MG tablet Take 1 tablet (50 mg total) by mouth daily.  . magnesium 30 MG tablet Take 30 mg by mouth 2 (two) times daily.    . methocarbamol (ROBAXIN) 500 MG tablet Take 500 mg by mouth at bedtime as needed for muscle spasms.   . methocarbamol (ROBAXIN) 500 MG tablet Take 1 tablet (500 mg total) by mouth every 6 (six) hours as needed for muscle spasms.  . Multiple Vitamins-Minerals (ICAPS AREDS 2 PO) Take 1 capsule by mouth daily.  . nitrofurantoin (MACRODANTIN) 50 MG capsule Take 50 mg by mouth as needed.  . Psyllium (METAMUCIL) 28.3 % POWD Take 1 scoop dissolved in at least 8 ounces water/juice and drink twice daily  . Red Yeast Rice Extract (RED YEAST RICE PO) Take 1 capsule by mouth daily.   . TURMERIC PO Take 400 mg by mouth daily.   No facility-administered encounter medications on file as of 05/02/2017.     Activities of Daily Living In your present state of health, do you have any difficulty performing the following activities: 02/20/2017  Hearing? N  Vision? N  Difficulty concentrating or making decisions? N  Walking or climbing stairs? Y  Dressing or bathing? N  Doing errands, shopping? N  Some recent data might be hidden    Patient Care Team: Binnie Rail, MD as PCP - General (Internal Medicine)    Assessment:    Physical assessment deferred to PCP.  Exercise Activities and Dietary recommendations   Diet (meal preparation, eat out, water intake, caffeinated beverages, dairy products, fruits and vegetables): {Desc; diets:16563} Breakfast: Lunch:  Dinner:      Goals    None     Fall Risk Fall Risk  11/26/2016 10/27/2014 10/21/2013 10/08/2012  Falls in the past year? No No No No   Depression Screen PHQ 2/9 Scores 11/26/2016 10/27/2014 10/21/2013 10/08/2012  PHQ - 2 Score 0 0 0 0     Cognitive Function        Immunization History  Administered Date(s) Administered  . Influenza Whole 07/09/2007, 06/21/2010,  07/25/2012, 07/24/2013  . Influenza-Unspecified 07/03/2016  . Pneumococcal Conjugate-13 11/15/2015  . Pneumococcal Polysaccharide-23 05/27/2008, 10/21/2013  . Td 01/01/2008  . Zoster 09/24/2008   Screening Tests Health Maintenance  Topic Date Due  . INFLUENZA VACCINE  04/24/2017  . TETANUS/TDAP  12/31/2017  . MAMMOGRAM  07/31/2018  . COLONOSCOPY  08/05/2019  . DEXA SCAN  Completed  . Hepatitis C Screening  Completed  . PNA vac Low Risk Adult  Completed      Plan:    I have personally reviewed and  noted the following in the patient's chart:   . Medical and social history . Use of alcohol, tobacco or illicit drugs  . Current medications and supplements . Functional ability and status . Nutritional status . Physical activity . Advanced directives . List of other physicians . Vitals . Screenings to include cognitive, depression, and falls . Referrals and appointments  In addition, I have reviewed and discussed with patient certain preventive protocols, quality metrics, and best practice recommendations. A written personalized care plan for preventive services as well as general preventive health recommendations were provided to patient.     Michiel Cowboy, RN  05/02/2017

## 2017-05-02 NOTE — Progress Notes (Signed)
Pre visit review using our clinic review tool, if applicable. No additional management support is needed unless otherwise documented below in the visit note. 

## 2017-05-02 NOTE — Progress Notes (Signed)
Subjective:  Patient ID: Heather Ramsey, female    DOB: 1946-09-04  Age: 71 y.o. MRN: [id number]  CC: Medicare Wellness and Hypertension   HPI Heather Ramsey presents for a BP check - When I last saw her she was experiencing pain, redness, and swelling in her lower extremities thought was related to the calcium channel blocker. She stopped taking it and today tells me that her BP has been well controlled.   She complains of a several week history of decreased hearing in her right ear.  Outpatient Medications Prior to Visit  Medication Sig Dispense Refill  . b complex vitamins tablet Take 1 tablet by mouth daily.      . Calcium Carbonate-Vitamin D (CALCIUM + D PO) Take 1 tablet by mouth 2 (two) times daily.     . Chromium 200 MCG TABS Take 1 tablet by mouth daily.    . diclofenac sodium (VOLTAREN) 1 % GEL Voltaren Gel 3 grams to 3 large joints upto TID 3 TUBES with 3 refills (Patient taking differently: Apply 1 application topically 3 (three) times daily as needed. Voltaren Gel 3 grams to 3 large joints upto TID 3 TUBES with 3 refills) 3 Tube 3  . Dietary Management Product (VASCULERA) TABS Take 1 capsule by mouth daily. 30 tablet 11  . ferrous sulfate 325 (65 FE) MG tablet Take 1 tablet (325 mg total) by mouth daily with breakfast. 180 tablet 1  . fish oil-omega-3 fatty acids 1000 MG capsule Take 1-2 g by mouth See admin instructions. 1 cap in the morning, 2 caps in the evening    . Ginger, Zingiber officinalis, (GINGER ROOT) 550 MG CAPS Take 1 capsule by mouth daily.     Marland Kitchen losartan (COZAAR) 50 MG tablet Take 1 tablet (50 mg total) by mouth daily. 90 tablet 3  . magnesium 30 MG tablet Take 30 mg by mouth 2 (two) times daily.      . Multiple Vitamins-Minerals (ICAPS AREDS 2 PO) Take 1 capsule by mouth daily.    . Psyllium (METAMUCIL) 28.3 % POWD Take 1 scoop dissolved in at least 8 ounces water/juice and drink twice daily 1 Bottle 0  . TURMERIC PO Take 400 mg by mouth daily.    .  clobetasol ointment (TEMOVATE) 1.88 % Apply 1 application topically 2 (two) times daily. (Patient not taking: Reported on 05/02/2017) 45 g 1  . cyclobenzaprine (FLEXERIL) 10 MG tablet Take 5 mg by mouth at bedtime.     Marland Kitchen HYDROcodone-acetaminophen (NORCO/VICODIN) 5-325 MG tablet Take 1 tablet by mouth every 8 (eight) hours as needed for moderate pain. (Patient not taking: Reported on 05/02/2017) 40 tablet 0  . HYDROcodone-acetaminophen (NORCO/VICODIN) 5-325 MG tablet Take 1-2 tablets by mouth every 3 (three) hours as needed for moderate pain. (Patient not taking: Reported on 05/02/2017) 60 tablet 0  . Ibuprofen (MOTRIN PO) Take 400 mg by mouth at bedtime.     . methocarbamol (ROBAXIN) 500 MG tablet Take 500 mg by mouth at bedtime as needed for muscle spasms.     . methocarbamol (ROBAXIN) 500 MG tablet Take 1 tablet (500 mg total) by mouth every 6 (six) hours as needed for muscle spasms. (Patient not taking: Reported on 05/02/2017) 40 tablet 3  . nitrofurantoin (MACRODANTIN) 50 MG capsule Take 50 mg by mouth as needed.    . Red Yeast Rice Extract (RED YEAST RICE PO) Take 1 capsule by mouth daily.      No facility-administered medications prior to  visit.     ROS Review of Systems  Constitutional: Negative for appetite change, diaphoresis and fatigue.  HENT: Positive for hearing loss. Negative for congestion, ear pain, facial swelling, sinus pressure and trouble swallowing.   Eyes: Negative.  Negative for visual disturbance.  Respiratory: Negative.  Negative for cough, chest tightness, shortness of breath and wheezing.   Cardiovascular: Negative for chest pain, palpitations and leg swelling.  Gastrointestinal: Negative for abdominal pain, blood in stool, constipation, diarrhea, nausea and vomiting.  Endocrine: Negative.   Genitourinary: Negative.  Negative for difficulty urinating and dysuria.  Musculoskeletal: Negative.   Skin: Negative.  Negative for color change.  Allergic/Immunologic: Negative.     Neurological: Negative.  Negative for dizziness, light-headedness and headaches.  Hematological: Negative.   Psychiatric/Behavioral: Negative.     Objective:  BP 118/74   Pulse 62   Temp 98 F (36.7 C) (Oral)   Resp 16   Ht 5\' 2"  (1.575 m)   Wt 130 lb (59 kg)   SpO2 98%   BMI 23.78 kg/m   BP Readings from Last 3 Encounters:  05/02/17 118/74  04/09/17 110/70  03/01/17 101/65    Wt Readings from Last 3 Encounters:  05/02/17 130 lb (59 kg)  04/09/17 129 lb 4 oz (58.6 kg)  02/26/17 128 lb 8.4 oz (58.3 kg)    Physical Exam  Constitutional: She is oriented to person, place, and time. No distress.  HENT:  Right Ear: Hearing, tympanic membrane and external ear normal. A foreign body (cerumen) is present.  Left Ear: Hearing, tympanic membrane, external ear and ear canal normal.  Mouth/Throat: Oropharynx is clear and moist. No oropharyngeal exudate.  Right EAC has a cerumen impaction. I put Colace in the EAC and irrigated it with water and used an ear pick to remove the wax. She tolerated the procedure well. Afterwards examination is normal.  Eyes: Conjunctivae are normal. Right eye exhibits no discharge. Left eye exhibits no discharge. No scleral icterus.  Neck: Normal range of motion. Neck supple. No JVD present. No thyromegaly present.  Cardiovascular: Normal rate, regular rhythm and intact distal pulses.  Exam reveals no gallop and no friction rub.   No murmur heard. Pulmonary/Chest: Effort normal and breath sounds normal. No respiratory distress. She has no wheezes. She has no rales. She exhibits no tenderness.  Abdominal: Soft. Bowel sounds are normal. She exhibits no distension and no mass. There is no tenderness. There is no rebound and no guarding.  Musculoskeletal: Normal range of motion. She exhibits no edema, tenderness or deformity.  Lymphadenopathy:    She has no cervical adenopathy.  Neurological: She is alert and oriented to person, place, and time.  Skin: Skin  is warm and dry. No rash noted. She is not diaphoretic. No erythema. No pallor.  Vitals reviewed.   Lab Results  Component Value Date   WBC 9.5 04/09/2017   HGB 12.0 04/09/2017   HCT 35.5 (L) 04/09/2017   PLT 326.0 04/09/2017   GLUCOSE 107 (H) 04/09/2017   CHOL 273 (H) 11/26/2016   TRIG 71.0 11/26/2016   HDL 80.90 11/26/2016   LDLDIRECT 186.4 10/21/2013   LDLCALC 178 (H) 11/26/2016   ALT 11 04/09/2017   AST 16 04/09/2017   NA 131 (L) 04/09/2017   K 4.0 04/09/2017   CL 96 04/09/2017   CREATININE 0.66 04/09/2017   BUN 12 04/09/2017   CO2 27 04/09/2017   TSH 3.17 11/26/2016   HGBA1C 5.4 11/26/2006    No results found.  Assessment & Plan:   Heather Ramsey was seen today for medicare wellness and hypertension.  Diagnoses and all orders for this visit:  Encounter for Medicare annual wellness exam  Essential hypertension- her blood pressure is well-controlled, will cont the ARB.  Localized edema- this has resolved with the discontinuation of the CCB.  Hearing loss due to cerumen impaction, right- her hearing is normal now that the cerumen has been removed.   I have discontinued Ms. Boyson's Red Yeast Rice Extract (RED YEAST RICE PO), nitrofurantoin, cyclobenzaprine, HYDROcodone-acetaminophen, methocarbamol, Ibuprofen (MOTRIN PO), HYDROcodone-acetaminophen, methocarbamol, and clobetasol ointment. I am also having her maintain her b complex vitamins, fish oil-omega-3 fatty acids, magnesium, Ginger Root, TURMERIC PO, Calcium Carbonate-Vitamin D (CALCIUM + D PO), Psyllium, diclofenac sodium, losartan, Chromium, Multiple Vitamins-Minerals (ICAPS AREDS 2 PO), VASCULERA, ferrous sulfate, amLODipine, and gabapentin.  Meds ordered this encounter  Medications  . amLODipine (NORVASC) 5 MG tablet  . gabapentin (NEURONTIN) 300 MG capsule     Follow-up: No Follow-up on file.  Scarlette Calico, MD

## 2017-05-08 DIAGNOSIS — M4316 Spondylolisthesis, lumbar region: Secondary | ICD-10-CM | POA: Diagnosis not present

## 2017-05-22 ENCOUNTER — Ambulatory Visit (INDEPENDENT_AMBULATORY_CARE_PROVIDER_SITE_OTHER): Payer: PPO | Admitting: Internal Medicine

## 2017-05-22 ENCOUNTER — Encounter: Payer: Self-pay | Admitting: Internal Medicine

## 2017-05-22 ENCOUNTER — Telehealth: Payer: Self-pay | Admitting: Internal Medicine

## 2017-05-22 VITALS — BP 110/78 | HR 74 | Temp 98.9°F | Wt 127.0 lb

## 2017-05-22 DIAGNOSIS — J019 Acute sinusitis, unspecified: Secondary | ICD-10-CM | POA: Insufficient documentation

## 2017-05-22 DIAGNOSIS — J0100 Acute maxillary sinusitis, unspecified: Secondary | ICD-10-CM | POA: Diagnosis not present

## 2017-05-22 MED ORDER — CEFDINIR 300 MG PO CAPS: 300.0000 mg | ORAL_CAPSULE | Freq: Two times a day (BID) | ORAL | 0 refills | Status: AC

## 2017-05-22 NOTE — Telephone Encounter (Signed)
According to our records she is not allergic to cephalosporins

## 2017-05-22 NOTE — Telephone Encounter (Signed)
Spoke with pharmacy. They have been informed that by our records the patient is not allergic. They also stated they have never filled any RX like this for her before.

## 2017-05-22 NOTE — Progress Notes (Signed)
Subjective:  Patient ID: Heather Ramsey, female    DOB: [birth year]  Age: 71 y.o. MRN: [id number]  CC: URI   HPI Heather Ramsey presents for a 2 week history of nonproductive cough, facial pain, low-grade fever to 100 with chills, thick yellow nasal phlegm, and sore throat.  Outpatient Medications Prior to Visit  Medication Sig Dispense Refill  . amLODipine (NORVASC) 5 MG tablet     . b complex vitamins tablet Take 1 tablet by mouth daily.      . Calcium Carbonate-Vitamin D (CALCIUM + D PO) Take 1 tablet by mouth 2 (two) times daily.     . Chromium 200 MCG TABS Take 1 tablet by mouth daily.    . diclofenac sodium (VOLTAREN) 1 % GEL Voltaren Gel 3 grams to 3 large joints upto TID 3 TUBES with 3 refills (Patient taking differently: Apply 1 application topically 3 (three) times daily as needed. Voltaren Gel 3 grams to 3 large joints upto TID 3 TUBES with 3 refills) 3 Tube 3  . Dietary Management Product (VASCULERA) TABS Take 1 capsule by mouth daily. 30 tablet 11  . ferrous sulfate 325 (65 FE) MG tablet Take 1 tablet (325 mg total) by mouth daily with breakfast. 180 tablet 1  . fish oil-omega-3 fatty acids 1000 MG capsule Take 1-2 g by mouth See admin instructions. 1 cap in the morning, 2 caps in the evening    . gabapentin (NEURONTIN) 300 MG capsule     . Ginger, Zingiber officinalis, (GINGER ROOT) 550 MG CAPS Take 1 capsule by mouth daily.     Marland Kitchen losartan (COZAAR) 50 MG tablet Take 1 tablet (50 mg total) by mouth daily. 90 tablet 3  . magnesium 30 MG tablet Take 30 mg by mouth 2 (two) times daily.      . Multiple Vitamins-Minerals (ICAPS AREDS 2 PO) Take 1 capsule by mouth daily.    . Psyllium (METAMUCIL) 28.3 % POWD Take 1 scoop dissolved in at least 8 ounces water/juice and drink twice daily 1 Bottle 0  . TURMERIC PO Take 400 mg by mouth daily.     No facility-administered medications prior to visit.     ROS Review of Systems  Constitutional: Positive for fever. Negative for  chills, diaphoresis and fatigue.  HENT: Positive for postnasal drip, rhinorrhea, sinus pain, sinus pressure and sore throat. Negative for congestion, facial swelling, sneezing and trouble swallowing.   Eyes: Negative.   Respiratory: Positive for cough. Negative for chest tightness, shortness of breath and wheezing.   Cardiovascular: Negative for chest pain, palpitations and leg swelling.  Gastrointestinal: Negative for abdominal pain, constipation, diarrhea, nausea and vomiting.  Endocrine: Negative.   Genitourinary: Negative.   Musculoskeletal: Negative.   Skin: Negative.  Negative for color change and rash.  Allergic/Immunologic: Negative.   Neurological: Negative.  Negative for dizziness, weakness and headaches.  Hematological: Negative.   Psychiatric/Behavioral: Negative.     Objective:  BP 110/78   Pulse 74   Temp 98.9 F (37.2 C)   Wt 127 lb (57.6 kg)   SpO2 98%   BMI 23.23 kg/m   BP Readings from Last 3 Encounters:  05/22/17 110/78  05/02/17 118/74  04/09/17 110/70    Wt Readings from Last 3 Encounters:  05/22/17 127 lb (57.6 kg)  05/02/17 130 lb (59 kg)  04/09/17 129 lb 4 oz (58.6 kg)    Physical Exam  Constitutional: She is oriented to person, place, and time. No distress.  HENT:  Right Ear: Hearing, tympanic membrane, external ear and ear canal normal.  Left Ear: Hearing, tympanic membrane, external ear and ear canal normal.  Nose: Rhinorrhea present. No mucosal edema. Right sinus exhibits maxillary sinus tenderness. Right sinus exhibits no frontal sinus tenderness. Left sinus exhibits maxillary sinus tenderness. Left sinus exhibits no frontal sinus tenderness.  Mouth/Throat: Oropharynx is clear and moist and mucous membranes are normal. Mucous membranes are not pale, not dry and not cyanotic. No oropharyngeal exudate, posterior oropharyngeal edema, posterior oropharyngeal erythema or tonsillar abscesses.  Eyes: Conjunctivae are normal. Right eye exhibits no  discharge. Left eye exhibits no discharge. No scleral icterus.  Neck: Normal range of motion. Neck supple. No JVD present. No thyromegaly present.  Cardiovascular: Normal rate, regular rhythm and intact distal pulses.  Exam reveals no gallop and no friction rub.   No murmur heard. Pulmonary/Chest: Effort normal and breath sounds normal. No respiratory distress. She has no wheezes. She has no rales. She exhibits no tenderness.  Abdominal: Soft. Bowel sounds are normal. She exhibits no distension and no mass. There is no tenderness. There is no rebound and no guarding.  Musculoskeletal: Normal range of motion. She exhibits no edema, tenderness or deformity.  Lymphadenopathy:    She has no cervical adenopathy.  Neurological: She is alert and oriented to person, place, and time.  Skin: Skin is warm and dry. No rash noted. She is not diaphoretic. No erythema. No pallor.  Vitals reviewed.   Lab Results  Component Value Date   WBC 9.5 04/09/2017   HGB 12.0 04/09/2017   HCT 35.5 (L) 04/09/2017   PLT 326.0 04/09/2017   GLUCOSE 107 (H) 04/09/2017   CHOL 273 (H) 11/26/2016   TRIG 71.0 11/26/2016   HDL 80.90 11/26/2016   LDLDIRECT 186.4 10/21/2013   LDLCALC 178 (H) 11/26/2016   ALT 11 04/09/2017   AST 16 04/09/2017   NA 131 (L) 04/09/2017   K 4.0 04/09/2017   CL 96 04/09/2017   CREATININE 0.66 04/09/2017   BUN 12 04/09/2017   CO2 27 04/09/2017   TSH 3.17 11/26/2016   HGBA1C 5.4 11/26/2006    No results found.  Assessment & Plan:   Heather Ramsey was seen today for uri.  Diagnoses and all orders for this visit:  Acute non-recurrent maxillary sinusitis- I will treat the infection with Omnicef. -     cefdinir (OMNICEF) 300 MG capsule; Take 1 capsule (300 mg total) by mouth 2 (two) times daily.   I am having Heather Ramsey start on cefdinir. I am also having her maintain her b complex vitamins, fish oil-omega-3 fatty acids, magnesium, Ginger Root, TURMERIC PO, Calcium Carbonate-Vitamin D  (CALCIUM + D PO), Psyllium, diclofenac sodium, losartan, Chromium, Multiple Vitamins-Minerals (ICAPS AREDS 2 PO), VASCULERA, ferrous sulfate, amLODipine, and gabapentin.  Meds ordered this encounter  Medications  . cefdinir (OMNICEF) 300 MG capsule    Sig: Take 1 capsule (300 mg total) by mouth 2 (two) times daily.    Dispense:  20 capsule    Refill:  0     Follow-up: Return in about 3 weeks (around 06/12/2017).  Scarlette Calico, MD

## 2017-05-22 NOTE — Telephone Encounter (Signed)
Pharmacy called stating that cefdinir (OMNICEF) 300 MG capsule was sent in for the pt but they have on file that she is allergic to cephalosporin. They wanted to know if we were aware of this and what Dr Ronnald Ramp would recommend.

## 2017-05-22 NOTE — Patient Instructions (Signed)

## 2017-05-23 DIAGNOSIS — M545 Low back pain: Secondary | ICD-10-CM | POA: Diagnosis not present

## 2017-05-23 DIAGNOSIS — M4316 Spondylolisthesis, lumbar region: Secondary | ICD-10-CM | POA: Diagnosis not present

## 2017-05-29 DIAGNOSIS — M4316 Spondylolisthesis, lumbar region: Secondary | ICD-10-CM | POA: Diagnosis not present

## 2017-05-29 DIAGNOSIS — M545 Low back pain: Secondary | ICD-10-CM | POA: Diagnosis not present

## 2017-06-03 DIAGNOSIS — H5203 Hypermetropia, bilateral: Secondary | ICD-10-CM | POA: Diagnosis not present

## 2017-06-03 DIAGNOSIS — H43813 Vitreous degeneration, bilateral: Secondary | ICD-10-CM | POA: Diagnosis not present

## 2017-06-03 DIAGNOSIS — H25813 Combined forms of age-related cataract, bilateral: Secondary | ICD-10-CM | POA: Diagnosis not present

## 2017-06-03 DIAGNOSIS — H04123 Dry eye syndrome of bilateral lacrimal glands: Secondary | ICD-10-CM | POA: Diagnosis not present

## 2017-06-05 DIAGNOSIS — M545 Low back pain: Secondary | ICD-10-CM | POA: Diagnosis not present

## 2017-06-05 DIAGNOSIS — M4316 Spondylolisthesis, lumbar region: Secondary | ICD-10-CM | POA: Diagnosis not present

## 2017-06-10 DIAGNOSIS — M545 Low back pain: Secondary | ICD-10-CM | POA: Diagnosis not present

## 2017-06-10 DIAGNOSIS — M4316 Spondylolisthesis, lumbar region: Secondary | ICD-10-CM | POA: Diagnosis not present

## 2017-06-12 DIAGNOSIS — M4316 Spondylolisthesis, lumbar region: Secondary | ICD-10-CM | POA: Diagnosis not present

## 2017-06-12 DIAGNOSIS — M545 Low back pain: Secondary | ICD-10-CM | POA: Diagnosis not present

## 2017-06-17 DIAGNOSIS — M545 Low back pain: Secondary | ICD-10-CM | POA: Diagnosis not present

## 2017-06-17 DIAGNOSIS — M4316 Spondylolisthesis, lumbar region: Secondary | ICD-10-CM | POA: Diagnosis not present

## 2017-06-19 DIAGNOSIS — M4316 Spondylolisthesis, lumbar region: Secondary | ICD-10-CM | POA: Diagnosis not present

## 2017-06-19 DIAGNOSIS — M545 Low back pain: Secondary | ICD-10-CM | POA: Diagnosis not present

## 2017-06-20 ENCOUNTER — Ambulatory Visit (INDEPENDENT_AMBULATORY_CARE_PROVIDER_SITE_OTHER): Payer: PPO | Admitting: Internal Medicine

## 2017-06-20 ENCOUNTER — Encounter: Payer: Self-pay | Admitting: Internal Medicine

## 2017-06-20 ENCOUNTER — Other Ambulatory Visit (INDEPENDENT_AMBULATORY_CARE_PROVIDER_SITE_OTHER): Payer: PPO

## 2017-06-20 VITALS — BP 108/80 | HR 70 | Temp 97.7°F | Resp 16 | Ht 62.0 in | Wt 129.2 lb

## 2017-06-20 DIAGNOSIS — I10 Essential (primary) hypertension: Secondary | ICD-10-CM | POA: Diagnosis not present

## 2017-06-20 DIAGNOSIS — D539 Nutritional anemia, unspecified: Secondary | ICD-10-CM

## 2017-06-20 DIAGNOSIS — Z23 Encounter for immunization: Secondary | ICD-10-CM

## 2017-06-20 LAB — CBC WITH DIFFERENTIAL/PLATELET
BASOS ABS: 0.1 10*3/uL (ref 0.0–0.1)
Basophils Relative: 0.9 % (ref 0.0–3.0)
Eosinophils Absolute: 0.1 10*3/uL (ref 0.0–0.7)
Eosinophils Relative: 1.8 % (ref 0.0–5.0)
HCT: 40 % (ref 36.0–46.0)
Hemoglobin: 13.4 g/dL (ref 12.0–15.0)
LYMPHS ABS: 1.5 10*3/uL (ref 0.7–4.0)
Lymphocytes Relative: 24.3 % (ref 12.0–46.0)
MCHC: 33.4 g/dL (ref 30.0–36.0)
MCV: 87.9 fl (ref 78.0–100.0)
MONOS PCT: 7.5 % (ref 3.0–12.0)
Monocytes Absolute: 0.5 10*3/uL (ref 0.1–1.0)
NEUTROS ABS: 4.1 10*3/uL (ref 1.4–7.7)
NEUTROS PCT: 65.5 % (ref 43.0–77.0)
PLATELETS: 239 10*3/uL (ref 150.0–400.0)
RBC: 4.55 Mil/uL (ref 3.87–5.11)
RDW: 13.8 % (ref 11.5–15.5)
WBC: 6.3 10*3/uL (ref 4.0–10.5)

## 2017-06-20 LAB — BASIC METABOLIC PANEL
BUN: 13 mg/dL (ref 6–23)
CALCIUM: 9.9 mg/dL (ref 8.4–10.5)
CO2: 30 meq/L (ref 19–32)
Chloride: 99 mEq/L (ref 96–112)
Creatinine, Ser: 0.7 mg/dL (ref 0.40–1.20)
GFR: 87.74 mL/min (ref >60.00)
GLUCOSE: 99 mg/dL (ref 70–99)
POTASSIUM: 4.2 meq/L (ref 3.5–5.1)
SODIUM: 136 meq/L (ref 135–145)

## 2017-06-20 LAB — VITAMIN B12: Vitamin B-12: 396 pg/mL (ref 211–911)

## 2017-06-20 LAB — IBC PANEL
Iron: 165 ug/dL — ABNORMAL HIGH (ref 42–145)
SATURATION RATIOS: 51.9 % — AB (ref 20.0–50.0)
Transferrin: 227 mg/dL (ref 212.0–360.0)

## 2017-06-20 LAB — FERRITIN: Ferritin: 73.5 ng/mL (ref 10.0–291.0)

## 2017-06-20 LAB — FOLATE: Folate: >23.9 ng/mL (ref >5.9)

## 2017-06-20 NOTE — Patient Instructions (Signed)
Anemia, Nonspecific Anemia is a condition in which the concentration of red blood cells or hemoglobin in the blood is below normal. Hemoglobin is a substance in red blood cells that carries oxygen to the tissues of the body. Anemia results in not enough oxygen reaching these tissues. What are the causes? Common causes of anemia include:  Excessive bleeding. Bleeding may be internal or external. This includes excessive bleeding from periods (in women) or from the intestine.  Poor nutrition.  Chronic kidney, thyroid, and liver disease.  Bone marrow disorders that decrease red blood cell production.  Cancer and treatments for cancer.  HIV, AIDS, and their treatments.  Spleen problems that increase red blood cell destruction.  Blood disorders.  Excess destruction of red blood cells due to infection, medicines, and autoimmune disorders. What are the signs or symptoms?  Minor weakness.  Dizziness.  Headache.  Palpitations.  Shortness of breath, especially with exercise.  Paleness.  Cold sensitivity.  Indigestion.  Nausea.  Difficulty sleeping.  Difficulty concentrating. Symptoms may occur suddenly or they may develop slowly. How is this diagnosed? Additional blood tests are often needed. These help your health care provider determine the best treatment. Your health care provider will check your stool for blood and look for other causes of blood loss. How is this treated? Treatment varies depending on the cause of the anemia. Treatment can include:  Supplements of iron, vitamin B12, or folic acid.  Hormone medicines.  A blood transfusion. This may be needed if blood loss is severe.  Hospitalization. This may be needed if there is significant continual blood loss.  Dietary changes.  Spleen removal. Follow these instructions at home: Keep all follow-up appointments. It often takes many weeks to correct anemia, and having your health care provider check on your  condition and your response to treatment is very important. Get help right away if:  You develop extreme weakness, shortness of breath, or chest pain.  You become dizzy or have trouble concentrating.  You develop heavy vaginal bleeding.  You develop a rash.  You have bloody or black, tarry stools.  You faint.  You vomit up blood.  You vomit repeatedly.  You have abdominal pain.  You have a fever or persistent symptoms for more than 2-3 days.  You have a fever and your symptoms suddenly get worse.  You are dehydrated. This information is not intended to replace advice given to you by your health care provider. Make sure you discuss any questions you have with your health care provider. Document Released: 10/18/2004 Document Revised: 02/22/2016 Document Reviewed: 03/06/2013 Elsevier Interactive Patient Education  2017 [employer]  

## 2017-06-20 NOTE — Progress Notes (Signed)
Subjective:  Patient ID: Heather Ramsey, female    DOB: 1945/12/11  Age: 71 y.o. MRN: [id number]  CC: Anemia   HPI KEBRINA FRIEND presents for f/up - She recently had low back surgery with some blood loss and ended up with an iron deficiency anemia. She returns today to see if she can stop taking iron because she thinks it's causing constipation. She complains of fatigue but otherwise feels well and offers no other complaints.  Outpatient Medications Prior to Visit  Medication Sig Dispense Refill  . amLODipine (NORVASC) 5 MG tablet     . b complex vitamins tablet Take 1 tablet by mouth daily.      . Calcium Carbonate-Vitamin D (CALCIUM + D PO) Take 1 tablet by mouth 2 (two) times daily.     . Chromium 200 MCG TABS Take 1 tablet by mouth daily.    . diclofenac sodium (VOLTAREN) 1 % GEL Voltaren Gel 3 grams to 3 large joints upto TID 3 TUBES with 3 refills (Patient taking differently: Apply 1 application topically 3 (three) times daily as needed. Voltaren Gel 3 grams to 3 large joints upto TID 3 TUBES with 3 refills) 3 Tube 3  . Dietary Management Product (VASCULERA) TABS Take 1 capsule by mouth daily. 30 tablet 11  . ferrous sulfate 325 (65 FE) MG tablet Take 1 tablet (325 mg total) by mouth daily with breakfast. 180 tablet 1  . fish oil-omega-3 fatty acids 1000 MG capsule Take 1-2 g by mouth See admin instructions. 1 cap in the morning, 2 caps in the evening    . gabapentin (NEURONTIN) 300 MG capsule     . Ginger, Zingiber officinalis, (GINGER ROOT) 550 MG CAPS Take 1 capsule by mouth daily.     Marland Kitchen losartan (COZAAR) 50 MG tablet Take 1 tablet (50 mg total) by mouth daily. 90 tablet 3  . magnesium 30 MG tablet Take 30 mg by mouth 2 (two) times daily.      . Multiple Vitamins-Minerals (ICAPS AREDS 2 PO) Take 1 capsule by mouth daily.    . Psyllium (METAMUCIL) 28.3 % POWD Take 1 scoop dissolved in at least 8 ounces water/juice and drink twice daily 1 Bottle 0  . TURMERIC PO Take 400  mg by mouth daily.     No facility-administered medications prior to visit.     ROS Review of Systems  Constitutional: Positive for fatigue. Negative for chills, diaphoresis, fever and unexpected weight change.  HENT: Negative for sinus pressure and trouble swallowing.   Eyes: Negative for visual disturbance.  Respiratory: Negative for cough, choking, chest tightness, shortness of breath, wheezing and stridor.   Cardiovascular: Negative for chest pain, palpitations and leg swelling.  Gastrointestinal: Positive for constipation. Negative for abdominal pain, anal bleeding, blood in stool, diarrhea, nausea and vomiting.  Endocrine: Negative.   Genitourinary: Negative.  Negative for difficulty urinating, dysuria, hematuria and vaginal bleeding.  Musculoskeletal: Negative.  Negative for back pain and myalgias.  Skin: Negative.   Allergic/Immunologic: Negative.   Neurological: Negative.  Negative for dizziness, weakness, light-headedness and numbness.  Hematological: Negative for adenopathy. Does not bruise/bleed easily.  Psychiatric/Behavioral: Negative.     Objective:  BP 108/80 (BP Location: Left Arm, Patient Position: Sitting, Cuff Size: Normal)   Pulse 70   Temp 97.7 F (36.5 C) (Oral)   Resp 16   Ht 5\' 2"  (1.575 m)   Wt 129 lb 4 oz (58.6 kg)   SpO2 97%   BMI 23.64  kg/m   BP Readings from Last 3 Encounters:  06/20/17 108/80  05/22/17 110/78  05/02/17 118/74    Wt Readings from Last 3 Encounters:  06/20/17 129 lb 4 oz (58.6 kg)  05/22/17 127 lb (57.6 kg)  05/02/17 130 lb (59 kg)    Physical Exam  Constitutional: She is oriented to person, place, and time. No distress.  HENT:  Mouth/Throat: Oropharynx is clear and moist. No oropharyngeal exudate.  Eyes: Conjunctivae are normal. Right eye exhibits no discharge. Left eye exhibits no discharge. No scleral icterus.  Neck: Normal range of motion. Neck supple. No JVD present. No thyromegaly present.  Cardiovascular: Normal  rate, regular rhythm and intact distal pulses.  Exam reveals no gallop and no friction rub.   No murmur heard. Pulmonary/Chest: Effort normal and breath sounds normal. No respiratory distress. She has no wheezes. She has no rales. She exhibits no tenderness.  Abdominal: Soft. Bowel sounds are normal. She exhibits no distension and no mass. There is no tenderness. There is no rebound and no guarding.  Musculoskeletal: Normal range of motion. She exhibits no edema, tenderness or deformity.  Lymphadenopathy:    She has no cervical adenopathy.  Neurological: She is alert and oriented to person, place, and time.  Skin: Skin is warm and dry. No rash noted. She is not diaphoretic. No erythema. No pallor.  Vitals reviewed.   Lab Results  Component Value Date   WBC 6.3 06/20/2017   HGB 13.4 06/20/2017   HCT 40.0 06/20/2017   PLT 239.0 06/20/2017   GLUCOSE 99 06/20/2017   CHOL 273 (H) 11/26/2016   TRIG 71.0 11/26/2016   HDL 80.90 11/26/2016   LDLDIRECT 186.4 10/21/2013   LDLCALC 178 (H) 11/26/2016   ALT 11 04/09/2017   AST 16 04/09/2017   NA 136 06/20/2017   K 4.2 06/20/2017   CL 99 06/20/2017   CREATININE 0.70 06/20/2017   BUN 13 06/20/2017   CO2 30 06/20/2017   TSH 3.17 11/26/2016   HGBA1C 5.4 11/26/2006    No results found.  Assessment & Plan:   Aniylah was seen today for anemia.  Diagnoses and all orders for this visit:  Essential hypertension- her blood pressure is well-controlled. Electrolytes and renal function are normal. -     Basic metabolic panel; Future  Deficiency anemia- Her H&H are normal now. Her vitamin levels are all normal. I've told her she can stop taking iron. -     CBC with Differential/Platelet; Future -     IBC panel; Future -     Vitamin B12; Future -     Ferritin; Future -     Folate; Future -     Vitamin B1; Future  Need for influenza vaccination -     Cancel: Flu vaccine HIGH DOSE PF (Fluzone High dose) -     Flu Vaccine QUAD 36+ mos  IM   I am having Ms. Macdowell maintain her b complex vitamins, fish oil-omega-3 fatty acids, magnesium, Ginger Root, TURMERIC PO, Calcium Carbonate-Vitamin D (CALCIUM + D PO), Psyllium, diclofenac sodium, losartan, Chromium, Multiple Vitamins-Minerals (ICAPS AREDS 2 PO), VASCULERA, ferrous sulfate, amLODipine, and gabapentin.  No orders of the defined types were placed in this encounter.    Follow-up: Return in about 4 months (around 10/20/2017).  Scarlette Calico, MD

## 2017-06-21 ENCOUNTER — Encounter: Payer: Self-pay | Admitting: Internal Medicine

## 2017-06-24 DIAGNOSIS — M545 Low back pain: Secondary | ICD-10-CM | POA: Diagnosis not present

## 2017-06-24 DIAGNOSIS — M4316 Spondylolisthesis, lumbar region: Secondary | ICD-10-CM | POA: Diagnosis not present

## 2017-06-25 LAB — VITAMIN B1: Vitamin B1 (Thiamine): 58 nmol/L — ABNORMAL HIGH (ref 8–30)

## 2017-06-26 DIAGNOSIS — M545 Low back pain: Secondary | ICD-10-CM | POA: Diagnosis not present

## 2017-06-26 DIAGNOSIS — M4316 Spondylolisthesis, lumbar region: Secondary | ICD-10-CM | POA: Diagnosis not present

## 2017-07-01 DIAGNOSIS — M4316 Spondylolisthesis, lumbar region: Secondary | ICD-10-CM | POA: Diagnosis not present

## 2017-07-01 DIAGNOSIS — M545 Low back pain: Secondary | ICD-10-CM | POA: Diagnosis not present

## 2017-07-03 DIAGNOSIS — M4316 Spondylolisthesis, lumbar region: Secondary | ICD-10-CM | POA: Diagnosis not present

## 2017-07-03 DIAGNOSIS — M545 Low back pain: Secondary | ICD-10-CM | POA: Diagnosis not present

## 2017-07-08 DIAGNOSIS — M4316 Spondylolisthesis, lumbar region: Secondary | ICD-10-CM | POA: Diagnosis not present

## 2017-07-08 DIAGNOSIS — M545 Low back pain: Secondary | ICD-10-CM | POA: Diagnosis not present

## 2017-07-10 DIAGNOSIS — M545 Low back pain: Secondary | ICD-10-CM | POA: Diagnosis not present

## 2017-07-10 DIAGNOSIS — M4316 Spondylolisthesis, lumbar region: Secondary | ICD-10-CM | POA: Diagnosis not present

## 2017-07-11 DIAGNOSIS — M4316 Spondylolisthesis, lumbar region: Secondary | ICD-10-CM | POA: Diagnosis not present

## 2017-07-28 NOTE — Patient Instructions (Addendum)
   Medications reviewed and updated.  Changes include continuing amlodipine 2.5 mg daily.   Your prescription(s) have been submitted to your pharmacy. Please take as directed and contact our office if you believe you are having problem(s) with the medication(s).    Please followup in 6 months

## 2017-07-28 NOTE — Progress Notes (Signed)
Subjective:    Patient ID: Heather Ramsey, female    DOB: [birth year], 71 y.o.   MRN: [id number]  HPI The patient is here for follow up.  Hypertension: She is taking her medication daily. She is compliant with a low sodium diet.  She denies chest pain, palpitations, edema, shortness of breath and regular headaches. She is exercising regularly.  She does not monitor her blood pressure at home.    Hyperlipidemia:  She did not tolerate lipitor and is only taking supplements.  She is compliant with a low cholesterol/fat die and is exercising.   Hyperglycemia:  She is eating healthy.  She is exercising.   Medications and allergies reviewed with patient and updated if appropriate.  Patient Active Problem List   Diagnosis Date Noted  . Deficiency anemia 04/09/2017  . Rash 04/09/2017  . Localized edema 04/09/2017  . Venous stasis dermatitis of both lower extremities 04/09/2017  . Spondylolisthesis at L4-L5 level 02/26/2017  . Urinary hesitancy 01/28/2017  . Fibromyalgia 01/03/2017  . Primary insomnia 01/03/2017  . Other fatigue 01/03/2017  . Primary osteoarthritis of both hands 01/03/2017  . Trochanteric bursitis of both hips 01/03/2017  . Hypertension 12/29/2016  . 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 Lumbar radiculopathy 09/04/2016  . 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 Abnormal EKG 02/05/2012  . Atrophic vaginitis   . Basal cell cancer 04/24/2011  . DIVERTICULOSIS, COLON 06/09/2009  . Hyperlipidemia 05/31/2008  . IRRITABLE BOWEL SYNDROME 05/27/2008  . Myalgia and myositis 05/27/2008  . Osteopenia 05/27/2008  . Chronic fatigue syndrome 05/27/2008    Current Outpatient Medications on File Prior to Visit  Medication Sig Dispense Refill  . b complex vitamins tablet Take 1 tablet by mouth daily.      . Calcium Carbonate-Vitamin D (CALCIUM + D PO) Take 1 tablet by mouth 2 (two) times daily.     . Chromium 200 MCG TABS  Take 1 tablet by mouth daily.    . diclofenac sodium (VOLTAREN) 1 % GEL Voltaren Gel 3 grams to 3 large joints upto TID 3 TUBES with 3 refills (Patient taking differently: Apply 1 application topically 3 (three) times daily as needed. Voltaren Gel 3 grams to 3 large joints upto TID 3 TUBES with 3 refills) 3 Tube 3  . Dietary Management Product (VASCULERA) TABS Take 1 capsule by mouth daily. 30 tablet 11  . fish oil-omega-3 fatty acids 1000 MG capsule Take 1-2 g by mouth See admin instructions. 1 cap in the morning, 2 caps in the evening    . Ginger, Zingiber officinalis, (GINGER ROOT) 550 MG CAPS Take 1 capsule by mouth daily.     Marland Kitchen losartan (COZAAR) 50 MG tablet Take 1 tablet (50 mg total) by mouth daily. 90 tablet 3  . magnesium 30 MG tablet Take 30 mg by mouth 2 (two) times daily.      . Multiple Vitamins-Minerals (ICAPS AREDS 2 PO) Take 1 capsule by mouth daily.    . Psyllium (METAMUCIL) 28.3 % POWD Take 1 scoop dissolved in at least 8 ounces water/juice and drink twice daily 1 Bottle 0  . TURMERIC PO Take 400 mg by mouth daily.     No current facility-administered medications on file prior to visit.     Past Medical History:  Diagnosis Date  . Allergy   . Atrophic vaginitis   . Chest pain    negative  cardiac evaluation; Dr Percival Spanish  . Diverticulosis of colon  2005& 2010   FH colon cancer  . Fibromyalgia    Dr Estanislado Pandy  . GERD (gastroesophageal reflux disease)   . Heart murmur   . Hemorrhoid   . Hyperlipidemia   . Hypertension   . IBS (irritable bowel syndrome)   . Migraine   . MVP (mitral valve prolapse)   . Osteopenia   . Pulmonary nodule 07/2006  . UTI (lower urinary tract infection) 2005   Citrobacter koseri, hospitalized w/ kidney infection 1983    Past Surgical History:  Procedure Laterality Date  . back surgy  03/30/2017   lumbar 4-5  . BREAST BIOPSY Left 2003  . [place]   with bladder tack   . COLONOSCOPY  07/2014   negative; Dr Olevia Perches  . FOOT  SURGERY Left 2014    Social History   Socioeconomic History  . Marital status: Married    Spouse name: None  . Number of children: 2  . Years of education: None  . Highest education level: None  Social Needs  . Financial resource strain: None  . Food insecurity - worry: None  . Food insecurity - inability: None  . Transportation needs - medical: None  . Transportation needs - non-medical: None  Occupational History  . None  Tobacco Use  . Smoking status: Never Smoker  . Smokeless tobacco: Never Used  Substance and Sexual Activity  . Alcohol use: No  . Drug use: No  . Sexual activity: Yes    Birth control/protection: Post-menopausal  Other Topics Concern  . None  Social History Narrative   Regular exercise- yes: walks, but not as much due to knee arthritis.  Does stretching   Lives with husband.          Family History  Problem Relation Age of Onset  . Heart attack Father 48       Died with multiple medical problems  . Colon cancer Father 87  . Diabetes Father   . Tuberculosis Paternal Grandmother   . Stroke Mother 53  . Hypothyroidism Mother   . Hypertension Mother   . Hypothyroidism Sister   . Hypertension Sister   . Breast cancer Sister   . Diabetes Sister   . Heart attack Sister        2 sisters > 36    Review of Systems  Constitutional: Negative for chills.  Respiratory: Negative for cough, shortness of breath and wheezing.   Cardiovascular: Negative for chest pain, palpitations and leg swelling.  Neurological: Negative for light-headedness and headaches.       Objective:   Vitals:   07/30/17 0858  BP: 132/84  Pulse: 70  Resp: 16  Temp: 98 F (36.7 C)  SpO2: 98%   Wt Readings from Last 3 Encounters:  07/30/17 129 lb (58.5 kg)  06/20/17 129 lb 4 oz (58.6 kg)  05/22/17 127 lb (57.6 kg)   Body mass index is 23.59 kg/m.   Physical Exam    Constitutional: Appears well-developed and well-nourished. No distress.  HENT:  Head:  Normocephalic and atraumatic.  Neck: Neck supple. No tracheal deviation present. No thyromegaly present.  No cervical lymphadenopathy Cardiovascular: Normal rate, regular rhythm and normal heart sounds.   No murmur heard. No carotid bruit .  No edema Pulmonary/Chest: Effort normal and breath sounds normal. No respiratory distress. No has no wheezes. No rales.  Skin: Skin is warm and dry. Not diaphoretic.  Psychiatric: Normal mood and affect. Behavior is normal.  Assessment & Plan:    See Problem List for Assessment and Plan of chronic medical problems.

## 2017-07-30 ENCOUNTER — Encounter: Payer: Self-pay | Admitting: Internal Medicine

## 2017-07-30 ENCOUNTER — Ambulatory Visit: Payer: PPO | Admitting: Internal Medicine

## 2017-07-30 VITALS — BP 132/84 | HR 70 | Temp 98.0°F | Resp 16 | Wt 129.0 lb

## 2017-07-30 DIAGNOSIS — I10 Essential (primary) hypertension: Secondary | ICD-10-CM | POA: Diagnosis not present

## 2017-07-30 DIAGNOSIS — E7849 Other hyperlipidemia: Secondary | ICD-10-CM

## 2017-07-30 DIAGNOSIS — M8589 Other specified disorders of bone density and structure, multiple sites: Secondary | ICD-10-CM | POA: Diagnosis not present

## 2017-07-30 DIAGNOSIS — R739 Hyperglycemia, unspecified: Secondary | ICD-10-CM | POA: Diagnosis not present

## 2017-07-30 MED ORDER — AMLODIPINE BESYLATE 2.5 MG PO TABS
2.5000 mg | ORAL_TABLET | Freq: Every day | ORAL | 3 refills | Status: DC
Start: 2017-07-30 — End: 2018-07-29

## 2017-07-30 NOTE — Assessment & Plan Note (Signed)
Did not tolerate lipitor Wants to avoid other statins Continue regular exercise and healthy diet

## 2017-07-30 NOTE — Assessment & Plan Note (Signed)
BP well controlled Current regimen effective and well tolerated Continue current medications at current doses cmp  

## 2017-07-30 NOTE — Assessment & Plan Note (Signed)
Has elevated sugars in past Will check a1c in 6 months

## 2017-07-30 NOTE — Assessment & Plan Note (Signed)
dexa scheduled for Friday Exercising Taking calcium and vitamin d

## 2017-08-02 ENCOUNTER — Encounter: Payer: Self-pay | Admitting: Internal Medicine

## 2017-08-02 DIAGNOSIS — Z803 Family history of malignant neoplasm of breast: Secondary | ICD-10-CM | POA: Diagnosis not present

## 2017-08-02 DIAGNOSIS — M810 Age-related osteoporosis without current pathological fracture: Secondary | ICD-10-CM | POA: Diagnosis not present

## 2017-08-02 DIAGNOSIS — Z1231 Encounter for screening mammogram for malignant neoplasm of breast: Secondary | ICD-10-CM | POA: Diagnosis not present

## 2017-08-02 DIAGNOSIS — M8589 Other specified disorders of bone density and structure, multiple sites: Secondary | ICD-10-CM | POA: Diagnosis not present

## 2017-08-02 LAB — HM MAMMOGRAPHY

## 2017-08-13 ENCOUNTER — Encounter: Payer: Self-pay | Admitting: Internal Medicine

## 2017-08-18 ENCOUNTER — Encounter: Payer: Self-pay | Admitting: Internal Medicine

## 2017-08-18 DIAGNOSIS — M810 Age-related osteoporosis without current pathological fracture: Secondary | ICD-10-CM | POA: Insufficient documentation

## 2017-12-06 ENCOUNTER — Telehealth: Payer: Self-pay | Admitting: Internal Medicine

## 2017-12-06 ENCOUNTER — Other Ambulatory Visit: Payer: Self-pay | Admitting: Internal Medicine

## 2017-12-06 MED ORDER — LOSARTAN POTASSIUM 100 MG PO TABS: 50.0000 mg | ORAL_TABLET | Freq: Every day | ORAL | 3 refills | Status: DC

## 2017-12-06 NOTE — Telephone Encounter (Signed)
Called pt no answer LMOM MD sent new rx to [facility].Marland KitchenJohny Ramsey

## 2017-12-06 NOTE — Telephone Encounter (Signed)
Copied from [place]. Topic: Quick Communication - See Telephone Encounter >> Dec 06, 2017 10:45 AM Hewitt Shorts wrote: CRM for notification. See Telephone encounter for:  Pt is calling to say that her losartan 50mg  is part of the recall and her pharmacy states that they probably will not be getting any more and that she needs to get an rx of 100 mg and be able to cut them in half   Best number is  12/06/17.  Best number

## 2017-12-06 NOTE — Telephone Encounter (Signed)
rx sent

## 2018-01-03 DIAGNOSIS — Z8679 Personal history of other diseases of the circulatory system: Secondary | ICD-10-CM | POA: Insufficient documentation

## 2018-01-03 DIAGNOSIS — Z85828 Personal history of other malignant neoplasm of skin: Secondary | ICD-10-CM | POA: Insufficient documentation

## 2018-01-03 DIAGNOSIS — Z8719 Personal history of other diseases of the digestive system: Secondary | ICD-10-CM | POA: Insufficient documentation

## 2018-01-03 DIAGNOSIS — Z8639 Personal history of other endocrine, nutritional and metabolic disease: Secondary | ICD-10-CM | POA: Insufficient documentation

## 2018-01-03 NOTE — Progress Notes (Signed)
Office Visit Note  Patient: Heather Ramsey             Date of Birth: [birth year]           MRN: [id number]             PCP: Binnie Rail, MD Referring: Binnie Rail, MD Visit Date: 01/17/2018 Occupation: @GUAROCC @    Subjective:  Trapezius muscle tenderness    History of Present Illness: Heather Ramsey is a 72 y.o. female with history of fibromyalgia and osteoarthritis.  Patient states that over the past month she has had increased muscle tenderness and muscle tension in the trapezius region and bilateral upper extremities.  She states that she has achiness when she is lying on her sides at night.  She states that her pain with range of motion has been improving though.  She states that right now she has been more physically active while they are renovating their home.  She denies any neck pain at this time.  She states she still has good range of motion in her neck as well.  She denies any numbness or tingling down bilateral arms.  She states that her left biceps tendon ruptured several years ago but she denies any muscle spasms at this time.  She states she continues to have fatigue and insomnia.  She states she sleeps about 5 or 6 hours a night.  She states that she has no longer using Voltaren gel due to reaction with her medications.  She states she continues to take natural anti-inflammatories.  She reports she has been walking for exercise on a regular basis.  She states that she does continue to have chronic pain in her lower back.  She states that she still continues to do daily exercises.  She reports that her bilateral trochanteric bursitis has improved.  She continues to have pain in bilateral hands.  She states that she also has trigger fingers in bilateral fingers that cause a locking sensation.  Her right ankle will occasionally swell but she has no discomfort at this time.    Activities of Daily Living:  Patient reports morning stiffness for 0 minutes.   Patient  Reports nocturnal pain.  Difficulty dressing/grooming: Denies Difficulty climbing stairs: Denies Difficulty getting out of chair: Denies Difficulty using hands for taps, buttons, cutlery, and/or writing: Reports   Review of Systems  Constitutional: Positive for fatigue.  HENT: Positive for mouth dryness. Negative for mouth sores and nose dryness.   Eyes: Positive for dryness. Negative for pain and visual disturbance.  Respiratory: Negative for cough, hemoptysis, shortness of breath and difficulty breathing.   Cardiovascular: Negative for chest pain, palpitations, hypertension and swelling in legs/feet.  Gastrointestinal: Positive for constipation. Negative for blood in stool and diarrhea.  Endocrine: Negative for increased urination.  Genitourinary: Negative for painful urination.  Musculoskeletal: Positive for arthralgias, joint pain, joint swelling, myalgias, muscle tenderness and myalgias. Negative for muscle weakness and morning stiffness.  Skin: Negative for color change, pallor, rash, hair loss, nodules/bumps, skin tightness, ulcers and sensitivity to sunlight.  Allergic/Immunologic: Negative for susceptible to infections.  Neurological: Negative for dizziness, numbness, headaches and weakness.  Hematological: Negative for swollen glands.  Psychiatric/Behavioral: Positive for sleep disturbance. Negative for depressed mood. The patient is not nervous/anxious.     PMFS History:  Patient Active Problem List   Diagnosis Date Noted  . Cardiac murmur 01/07/2018  . History of IBS 01/03/2018  . Osteoporosis 08/18/2017  . Hyperglycemia 07/30/2017  .  Deficiency anemia 04/09/2017  . Rash 04/09/2017  . Localized edema 04/09/2017  . Venous stasis dermatitis of both lower extremities 04/09/2017  . Spondylolisthesis at L4-L5 level 02/26/2017  . Urinary hesitancy 01/28/2017  . Fibromyalgia 01/03/2017  . Primary insomnia 01/03/2017  . Other fatigue 01/03/2017  . Primary osteoarthritis of  both hands 01/03/2017  . Trochanteric bursitis of both hips 01/03/2017  . Hypertension 12/29/2016  . 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 Lumbar radiculopathy 09/04/2016  . 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 Abnormal EKG 02/05/2012  . Atrophic vaginitis   . Basal cell cancer 04/24/2011  . DIVERTICULOSIS, COLON 06/09/2009  . Hyperlipidemia 05/31/2008  . IRRITABLE BOWEL SYNDROME 05/27/2008  . Myalgia and myositis 05/27/2008  . Osteopenia 05/27/2008  . Chronic fatigue syndrome 05/27/2008    Past Medical History:  Diagnosis Date  . Allergy   . Atrophic vaginitis   . Chest pain    negative  cardiac evaluation; Dr Percival Spanish  . Diverticulosis of colon 2005& 2010   FH colon cancer  . Fibromyalgia    Dr Estanislado Pandy  . GERD (gastroesophageal reflux disease)   . Heart murmur   . Hemorrhoid   . Hyperlipidemia   . Hypertension   . IBS (irritable bowel syndrome)   . Migraine   . MVP (mitral valve prolapse)   . Osteopenia   . Pulmonary nodule 07/2006  . UTI (lower urinary tract infection) 2005   Citrobacter koseri, hospitalized w/ kidney infection 1983    Family History  Problem Relation Age of Onset  . Heart attack Father 1       Died with multiple medical problems  . Colon cancer Father 57  . Diabetes Father   . Tuberculosis Paternal Grandmother   . Stroke Mother 55  . Hypothyroidism Mother   . Hypertension Mother   . Hypothyroidism Sister   . Hypertension Sister   . Breast cancer Sister   . Diabetes Sister   . Heart attack Sister        2 sisters > 11   Past Surgical History:  Procedure Laterality Date  . back surgy  02/26/2017   lumbar 4-5  . BREAST BIOPSY Left 2003  . [place]   with bladder tack   . COLONOSCOPY  07/2014   negative; Dr Olevia Perches  . FOOT SURGERY Left 2014   Social History   Social History Narrative   Regular exercise- yes: walks, but not as much due to knee arthritis.  Does  stretching   Lives with husband.           Objective: Vital Signs: BP 124/81 (BP Location: Left Arm, Patient Position: Sitting, Cuff Size: Normal)   Pulse (!) 58   Resp 16   Ht 5\' 2"  (1.575 m)   Wt 136 lb (61.7 kg)   BMI 24.87 kg/m    Physical Exam  Constitutional: She is oriented to person, place, and time. She appears well-developed and well-nourished.  HENT:  Head: Normocephalic and atraumatic.  Eyes: Conjunctivae and EOM are normal.  Neck: Normal range of motion.  Cardiovascular: Normal rate, regular rhythm, normal heart sounds and intact distal pulses.  Pulmonary/Chest: Effort normal and breath sounds normal.  Abdominal: Soft. Bowel sounds are normal.  Lymphadenopathy:    She has no cervical adenopathy.  Neurological: She is alert and oriented to person, place, and time.  Skin: Skin is warm and dry. Capillary refill takes less than 2 seconds.  Psychiatric: She  has a normal mood and affect. Her behavior is normal.  Nursing note and vitals reviewed.    Musculoskeletal Exam: C-spine limited range of motion with lateral rotation.  Limited range of motion of lumbar spine.  Midline spinal tenderness in the lumbar region.  Shoulder joints, elbow joints, wrist joints, MCPs, PIPs, DIPs good range of motion no synovitis.  Hip joints, knee joints, ankle joints, MTPs, PIPs, DIPs good range of motion no synovitis.  No warmth or effusion of bilateral knees.  She is tenderness of bilateral trochanteric bursa.  CDAI Exam: No CDAI exam completed.    Investigation: No additional findings. CBC Latest Ref Rng & Units 01/07/2018 06/20/2017 04/09/2017  WBC 4.0 - 10.5 K/uL 5.2 6.3 9.5  Hemoglobin 12.0 - 15.0 g/dL 13.6 13.4 12.0  Hematocrit 36.0 - 46.0 % 39.8 40.0 35.5(L)  Platelets 150.0 - 400.0 K/uL 303.0 239.0 326.0   CMP Latest Ref Rng & Units 01/07/2018 06/20/2017 04/09/2017  Glucose 70 - 99 mg/dL 90 99 107(H)  BUN 6 - 23 mg/dL 11 13 12   Creatinine 0.40 - 1.20 mg/dL 0.70 0.70 0.66    Sodium 135 - 145 mEq/L 135 136 131(L)  Potassium 3.5 - 5.1 mEq/L 4.2 4.2 4.0  Chloride 96 - 112 mEq/L 100 99 96  CO2 19 - 32 mEq/L 27 30 27   Calcium 8.4 - 10.5 mg/dL 9.6 9.9 9.6  Total Protein 6.0 - 8.3 g/dL 7.2 - 6.8  Total Bilirubin 0.2 - 1.2 mg/dL 0.5 - 0.3  Alkaline Phos 39 - 117 U/L 51 - 78  AST 0 - 37 U/L 20 - 16  ALT 0 - 35 U/L 18 - 11    Imaging: No results found.  Speciality Comments: No specialty comments available.    Procedures:  No procedures performed Allergies: Amlodipine; Clarithromycin; Clindamycin; Levofloxacin; Tramadol; Actonel [risedronate sodium]; Atorvastatin; Bactroban [mupirocin calcium]; and Risedronate sodium   Assessment / Plan:     Visit Diagnoses: Fibromyalgia: She has generalized muscle tenderness and muscle tension in the trapezius region and biceps bilaterally.  She has tenderness at the deltoid insertion bilaterally.  She is advised that she can try using Mobisyl over-the-counter. She can also use a heat pad and myofascial release massage. She is no longer using Voltaren gel.  She takes natural anti-inflammatories on a daily basis.  She is encouraged to continue to exercise on a regular basis.  Good sleep hygiene was discussed today.  She is not on any medications for management of Fibromyalgia and she does not want to add any medications at this time.  Primary insomnia: She states 5 to 6 hours a night.  Good sleep hygiene was discussed.  Other fatigue: Chronic and related to insomnia.  She was advised to continue to exercise on a regular basis.  History of osteopenia: She is on a vitamin D and calcium supplement.  Primary osteoarthritis of both hands: She experiences discomfort in bilateral hands.  She has no synovitis on exam.  Joint protection and muscle strengthening were discussed.  Trochanteric bursitis of both hips: She has mild tenderness of bilateral trochanteric bursa.  She performs stretching exercises on a regular  basis.  Spondylolisthesis at L4-L5 level: Chronic pain  Other medical conditions are listed as follows:  Venous stasis dermatitis of both lower extremities  Spinal stenosis of lumbar region with neurogenic claudication: Chronic pain  History of hyperlipidemia  History of hypertension  History of IBS  History of basal cell cancer  PVNS (pigmented villonodular synovitis)  History of  diverticulosis    Orders: No orders of the defined types were placed in this encounter.  No orders of the defined types were placed in this encounter.   Face-to-face time spent with patient was 30 minutes. >50% of time was spent in counseling and coordination of care.  Follow-Up Instructions: Return in about 6 months (around 07/19/2018) for Fibromyalgia, Osteoarthritis.   Ofilia Neas, PA-C  Note - This record has been created using Dragon software.  Chart creation errors have been sought, but may not always  have been located. Such creation errors do not reflect on  the standard of medical care.

## 2018-01-06 NOTE — Patient Instructions (Addendum)
Test(s) ordered today. Your results will be released to [place] (or called to you) after review, usually within 72hours after test completion. If any changes need to be made, you will be notified at that same time.  All other Health Maintenance issues reviewed.   All recommended immunizations and age-appropriate screenings are up-to-date or discussed.  No immunizations administered today.   Medications reviewed and updated.  No changes recommended at this time.  Your prescription(s) have been submitted to your pharmacy. Please take as directed and contact our office if you believe you are having problem(s) with the medication(s).   Please followup in 6 months   Health Maintenance, Female Adopting a healthy lifestyle and getting preventive care can go a long way to promote health and wellness. Talk with your health care provider about what schedule of regular examinations is right for you. This is a good chance for you to check in with your provider about disease prevention and staying healthy. In between checkups, there are plenty of things you can do on your own. Experts have done a lot of research about which lifestyle changes and preventive measures are most likely to keep you healthy. Ask your health care provider for more information. Weight and diet Eat a healthy diet  Be sure to include plenty of vegetables, fruits, low-fat dairy products, and lean protein.  Do not eat a lot of foods high in solid fats, added sugars, or salt.  Get regular exercise. This is one of the most important things you can do for your health. ? Most adults should exercise for at least 150 minutes each week. The exercise should increase your heart rate and make you sweat (moderate-intensity exercise). ? Most adults should also do strengthening exercises at least twice a week. This is in addition to the moderate-intensity exercise.  Maintain a healthy weight  Body mass index (BMI) is a measurement that can  be used to identify possible weight problems. It estimates body fat based on height and weight. Your health care provider can help determine your BMI and help you achieve or maintain a healthy weight.  For females 71 years of age and older: ? A BMI below 18.5 is considered underweight. ? A BMI of 18.5 to 24.9 is normal. ? A BMI of 25 to 29.9 is considered overweight. ? A BMI of 30 and above is considered obese.  Watch levels of cholesterol and blood lipids  You should start having your blood tested for lipids and cholesterol at 71 years of age, then have this test every 5 years.  You may need to have your cholesterol levels checked more often if: ? Your lipid or cholesterol levels are high. ? You are older than 71 years of age. ? You are at high risk for heart disease.  Cancer screening Lung Cancer  Lung cancer screening is recommended for adults 71-50 years old who are at high risk for lung cancer because of a history of smoking.  A yearly low-dose CT scan of the lungs is recommended for people who: ? Currently smoke. ? Have quit within the past 15 years. ? Have at least a 30-pack-year history of smoking. A pack year is smoking an average of one pack of cigarettes a day for 1 year.  Yearly screening should continue until it has been 15 years since you quit.  Yearly screening should stop if you develop a health problem that would prevent you from having lung cancer treatment.  Breast Cancer  Practice breast self-awareness.  This means understanding how your breasts normally appear and feel.  It also means doing regular breast self-exams. Let your health care provider know about any changes, no matter how small.  If you are in your 71s or 30s, you should have a clinical breast exam (CBE) by a health care provider every 71-3 years as part of a regular health exam.  If you are 71 or older, have a CBE every year. Also consider having a breast X-ray (mammogram) every year.  If you  have a family history of breast cancer, talk to your health care provider about genetic screening.  If you are at high risk for breast cancer, talk to your health care provider about having an MRI and a mammogram every year.  Breast cancer gene (BRCA) assessment is recommended for women who have family members with BRCA-related cancers. BRCA-related cancers include: ? Breast. ? Ovarian. ? Tubal. ? Peritoneal cancers.  Results of the assessment will determine the need for genetic counseling and BRCA1 and BRCA2 testing.  Cervical Cancer Your health care provider may recommend that you be screened regularly for cancer of the pelvic organs (ovaries, uterus, and vagina). This screening involves a pelvic examination, including checking for microscopic changes to the surface of your cervix (Pap test). You may be encouraged to have this screening done every 3 years, beginning at age 71.  For women ages 71-65, health care providers may recommend pelvic exams and Pap testing every 3 years, or they may recommend the Pap and pelvic exam, combined with testing for human papilloma virus (HPV), every 5 years. Some types of HPV increase your risk of cervical cancer. Testing for HPV may also be done on women of any age with unclear Pap test results.  Other health care providers may not recommend any screening for nonpregnant women who are considered low risk for pelvic cancer and who do not have symptoms. Ask your health care provider if a screening pelvic exam is right for you.  If you have had past treatment for cervical cancer or a condition that could lead to cancer, you need Pap tests and screening for cancer for at least 20 years after your treatment. If Pap tests have been discontinued, your risk factors (such as having a new sexual partner) need to be reassessed to determine if screening should resume. Some women have medical problems that increase the chance of getting cervical cancer. In these cases,  your health care provider may recommend more frequent screening and Pap tests.  Colorectal Cancer  This type of cancer can be detected and often prevented.  Routine colorectal cancer screening usually begins at 71 years of age and continues through 71 years of age.  Your health care provider may recommend screening at an earlier age if you have risk factors for colon cancer.  Your health care provider may also recommend using home test kits to check for hidden blood in the stool.  A small camera at the end of a tube can be used to examine your colon directly (sigmoidoscopy or colonoscopy). This is done to check for the earliest forms of colorectal cancer.  Routine screening usually begins at age 96.  Direct examination of the colon should be repeated every 5-10 years through 71 years of age. However, you may need to be screened more often if early forms of precancerous polyps or small growths are found.  Skin Cancer  Check your skin from head to toe regularly.  Tell your health care provider about any  new moles or changes in moles, especially if there is a change in a mole's shape or color.  Also tell your health care provider if you have a mole that is larger than the size of a pencil eraser.  Always use sunscreen. Apply sunscreen liberally and repeatedly throughout the day.  Protect yourself by wearing long sleeves, pants, a wide-brimmed hat, and sunglasses whenever you are outside.  Heart disease, diabetes, and high blood pressure  High blood pressure causes heart disease and increases the risk of stroke. High blood pressure is more likely to develop in: ? People who have blood pressure in the high end of the normal range (130-139/85-89 mm Hg). ? People who are overweight or obese. ? People who are African American.  If you are 58-1 years of age, have your blood pressure checked every 3-5 years. If you are 52 years of age or older, have your blood pressure checked every year.  You should have your blood pressure measured twice-once when you are at a hospital or clinic, and once when you are not at a hospital or clinic. Record the average of the two measurements. To check your blood pressure when you are not at a hospital or clinic, you can use: ? An automated blood pressure machine at a pharmacy. ? A home blood pressure monitor.  If you are between 24 years and 65 years old, ask your health care provider if you should take aspirin to prevent strokes.  Have regular diabetes screenings. This involves taking a blood sample to check your fasting blood sugar level. ? If you are at a normal weight and have a low risk for diabetes, have this test once every three years after 71 years of age. ? If you are overweight and have a high risk for diabetes, consider being tested at a younger age or more often. Preventing infection Hepatitis B  If you have a higher risk for hepatitis B, you should be screened for this virus. You are considered at high risk for hepatitis B if: ? You were born in a country where hepatitis B is common. Ask your health care provider which countries are considered high risk. ? Your parents were born in a high-risk country, and you have not been immunized against hepatitis B (hepatitis B vaccine). ? You have HIV or AIDS. ? You use needles to inject street drugs. ? You live with someone who has hepatitis B. ? You have had sex with someone who has hepatitis B. ? You get hemodialysis treatment. ? You take certain medicines for conditions, including cancer, organ transplantation, and autoimmune conditions.  Hepatitis C  Blood testing is recommended for: ? Everyone born from [birth year] through 1964. ? Anyone with known risk factors for hepatitis C.  Sexually transmitted infections (STIs)  You should be screened for sexually transmitted infections (STIs) including gonorrhea and chlamydia if: ? You are sexually active and are younger than 71 years of  age. ? You are older than 71 years of age and your health care provider tells you that you are at risk for this type of infection. ? Your sexual activity has changed since you were last screened and you are at an increased risk for chlamydia or gonorrhea. Ask your health care provider if you are at risk.  If you do not have HIV, but are at risk, it may be recommended that you take a prescription medicine daily to prevent HIV infection. This is called pre-exposure prophylaxis (PrEP). You are considered at  risk if: ? You are sexually active and do not regularly use condoms or know the HIV status of your partner(s). ? You take drugs by injection. ? You are sexually active with a partner who has HIV.  Talk with your health care provider about whether you are at high risk of being infected with HIV. If you choose to begin PrEP, you should first be tested for HIV. You should then be tested every 3 months for as long as you are taking PrEP. Pregnancy  If you are premenopausal and you may become pregnant, ask your health care provider about preconception counseling.  If you may become pregnant, take 400 to 800 micrograms (mcg) of folic acid every day.  If you want to prevent pregnancy, talk to your health care provider about birth control (contraception). Osteoporosis and menopause  Osteoporosis is a disease in which the bones lose minerals and strength with aging. This can result in serious bone fractures. Your risk for osteoporosis can be identified using a bone density scan.  If you are 6 years of age or older, or if you are at risk for osteoporosis and fractures, ask your health care provider if you should be screened.  Ask your health care provider whether you should take a calcium or vitamin D supplement to lower your risk for osteoporosis.  Menopause may have certain physical symptoms and risks.  Hormone replacement therapy may reduce some of these symptoms and risks. Talk to your health  care provider about whether hormone replacement therapy is right for you. Follow these instructions at home:  Schedule regular health, dental, and eye exams.  Stay current with your immunizations.  Do not use any tobacco products including cigarettes, chewing tobacco, or electronic cigarettes.  If you are pregnant, do not drink alcohol.  If you are breastfeeding, limit how much and how often you drink alcohol.  Limit alcohol intake to no more than 1 drink per day for nonpregnant women. One drink equals 12 ounces of beer, 5 ounces of wine, or 1 ounces of hard liquor.  Do not use street drugs.  Do not share needles.  Ask your health care provider for help if you need support or information about quitting drugs.  Tell your health care provider if you often feel depressed.  Tell your health care provider if you have ever been abused or do not feel safe at home. This information is not intended to replace advice given to you by your health care provider. Make sure you discuss any questions you have with your health care provider. Document Released: 03/26/2011 Document Revised: 02/16/2016 Document Reviewed: 06/14/2015 Elsevier Interactive Patient Education  [employer].

## 2018-01-06 NOTE — Progress Notes (Signed)
Subjective:    Patient ID: Heather Ramsey, female    DOB: 1946/07/27, 72 y.o.   MRN: [id number]  HPI She is here for a physical exam.   She denies changes in her health and has no concerns.    Medications and allergies reviewed with patient and updated if appropriate.  Patient Active Problem List   Diagnosis Date Noted  . History of IBS 01/03/2018  . Osteoporosis 08/18/2017  . Hyperglycemia 07/30/2017  . Deficiency anemia 04/09/2017  . Rash 04/09/2017  . Localized edema 04/09/2017  . Venous stasis dermatitis of both lower extremities 04/09/2017  . Spondylolisthesis at L4-L5 level 02/26/2017  . Urinary hesitancy 01/28/2017  . Fibromyalgia 01/03/2017  . Primary insomnia 01/03/2017  . Other fatigue 01/03/2017  . Primary osteoarthritis of both hands 01/03/2017  . Trochanteric bursitis of both hips 01/03/2017  . Hypertension 12/29/2016  . 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 Lumbar radiculopathy 09/04/2016  . 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 Abnormal EKG 02/05/2012  . Atrophic vaginitis   . Basal cell cancer 04/24/2011  . DIVERTICULOSIS, COLON 06/09/2009  . Hyperlipidemia 05/31/2008  . IRRITABLE BOWEL SYNDROME 05/27/2008  . Myalgia and myositis 05/27/2008  . Osteopenia 05/27/2008  . Chronic fatigue syndrome 05/27/2008    Current Outpatient Medications on File Prior to Visit  Medication Sig Dispense Refill  . amLODipine (NORVASC) 2.5 MG tablet Take 1 tablet (2.5 mg total) daily by mouth. 90 tablet 3  . b complex vitamins tablet Take 1 tablet by mouth daily.      . Calcium Carbonate-Vitamin D (CALCIUM + D PO) Take 1 tablet by mouth 2 (two) times daily.     . Chromium 200 MCG TABS Take 1 tablet by mouth daily.    . diclofenac sodium (VOLTAREN) 1 % GEL Voltaren Gel 3 grams to 3 large joints upto TID 3 TUBES with 3 refills (Patient taking differently: Apply 1 application topically 3 (three) times daily as  needed. Voltaren Gel 3 grams to 3 large joints upto TID 3 TUBES with 3 refills) 3 Tube 3  . Dietary Management Product (VASCULERA) TABS Take 1 capsule by mouth daily. 30 tablet 11  . Docosahexaenoic Acid (DHA ALGAL-900 PO) Take 900 mg by mouth daily.    . fish oil-omega-3 fatty acids 1000 MG capsule Take 1-2 g by mouth See admin instructions. 1 cap in the morning, 2 caps in the evening    . Ginger, Zingiber officinalis, (GINGER ROOT) 550 MG CAPS Take 1 capsule by mouth daily.     Marland Kitchen losartan (COZAAR) 100 MG tablet Take 0.5 tablets (50 mg total) by mouth daily. 45 tablet 3  . magnesium 30 MG tablet Take 30 mg by mouth 2 (two) times daily.      . Multiple Vitamins-Minerals (ICAPS AREDS 2 PO) Take 1 capsule by mouth daily.    . Probiotic Product (PROBIOTIC-10 PO) Take by mouth daily.    . Psyllium (METAMUCIL) 28.3 % POWD Take 1 scoop dissolved in at least 8 ounces water/juice and drink twice daily 1 Bottle 0  . TURMERIC PO Take 400 mg by mouth daily.     No current facility-administered medications on file prior to visit.     Past Medical History:  Diagnosis Date  . Allergy   . Atrophic vaginitis   . Chest pain    negative  cardiac evaluation; Dr Percival Spanish  . Diverticulosis of colon 2005& 2010   FH colon cancer  .  Fibromyalgia    Dr Estanislado Pandy  . GERD (gastroesophageal reflux disease)   . Heart murmur   . Hemorrhoid   . Hyperlipidemia   . Hypertension   . IBS (irritable bowel syndrome)   . Migraine   . MVP (mitral valve prolapse)   . Osteopenia   . Pulmonary nodule 07/2006  . UTI (lower urinary tract infection) 2005   Citrobacter koseri, hospitalized w/ kidney infection 1983    Past Surgical History:  Procedure Laterality Date  . back surgy  03/30/2017   lumbar 4-5  . BREAST BIOPSY Left 2003  . [place]   with bladder tack   . COLONOSCOPY  07/2014   negative; Dr Olevia Perches  . FOOT SURGERY Left 2014    Social History   Socioeconomic History  . Marital status:  Married    Spouse name: Not on file  . Number of children: 2  . Years of education: Not on file  . Highest education level: Not on file  Occupational History  . Not on file  Social Needs  . Financial resource strain: Not on file  . Food insecurity:    Worry: Not on file    Inability: Not on file  . Transportation needs:    Medical: Not on file    Non-medical: Not on file  Tobacco Use  . Smoking status: Never Smoker  . Smokeless tobacco: Never Used  Substance and Sexual Activity  . Alcohol use: No  . Drug use: No  . Sexual activity: Yes    Birth control/protection: Post-menopausal  Lifestyle  . Physical activity:    Days per week: Not on file    Minutes per session: Not on file  . Stress: Not on file  Relationships  . Social connections:    Talks on phone: Not on file    Gets together: Not on file    Attends religious service: Not on file    Active member of club or organization: Not on file    Attends meetings of clubs or organizations: Not on file    Relationship status: Not on file  Other Topics Concern  . Not on file  Social History Narrative   Regular exercise- yes: walks, but not as much due to knee arthritis.  Does stretching   Lives with husband.          Family History  Problem Relation Age of Onset  . Heart attack Father 35       Died with multiple medical problems  . Colon cancer Father 34  . Diabetes Father   . Tuberculosis Paternal Grandmother   . Stroke Mother 57  . Hypothyroidism Mother   . Hypertension Mother   . Hypothyroidism Sister   . Hypertension Sister   . Breast cancer Sister   . Diabetes Sister   . Heart attack Sister        2 sisters > 72    Review of Systems  Constitutional: Negative for chills and fever.  Eyes: Negative for visual disturbance.  Respiratory: Positive for cough (allergy related). Negative for shortness of breath and wheezing.   Cardiovascular: Positive for leg swelling (occasionally). Negative for chest pain  and palpitations.  Gastrointestinal: Positive for anal bleeding (occ) and constipation (controlled). Negative for abdominal pain, blood in stool, diarrhea and nausea.       No gerd  Genitourinary: Negative for dysuria and hematuria.  Musculoskeletal: Positive for arthralgias and back pain (if sit or stand too long).  Skin:  Negative for color change and rash.  Neurological: Negative for dizziness, light-headedness and headaches.  Psychiatric/Behavioral: Negative for dysphoric mood. The patient is not nervous/anxious.        Objective:   Vitals:   01/07/18 0908  BP: 128/86  Pulse: 62  Resp: 16  Temp: 98.1 F (36.7 C)  SpO2: 97%   Filed Weights   01/07/18 0908  Weight: 132 lb (59.9 kg)   Body mass index is 24.14 kg/m.  Wt Readings from Last 3 Encounters:  01/07/18 132 lb (59.9 kg)  07/30/17 129 lb (58.5 kg)  06/20/17 129 lb 4 oz (58.6 kg)     Physical Exam Constitutional: She appears well-developed and well-nourished. No distress.  HENT:  Head: Normocephalic and atraumatic.  Right Ear: External ear normal. Normal ear canal and TM Left Ear: External ear normal.  Normal ear canal and TM Mouth/Throat: Oropharynx is clear and moist.  Eyes: Conjunctivae and EOM are normal.  Neck: Neck supple. No tracheal deviation present. No thyromegaly present.  No carotid bruit  Cardiovascular: Normal rate, regular rhythm and normal heart sounds.   2/6 systolic murmur heard.  No edema. Pulmonary/Chest: Effort normal and breath sounds normal. No respiratory distress. She has no wheezes. She has no rales.  Breast: deferred   Abdominal: Soft. She exhibits no distension. There is no tenderness.  Lymphadenopathy: She has no cervical adenopathy.  Skin: Skin is warm and dry. She is not diaphoretic.  Psychiatric: She has a normal mood and affect. Her behavior is normal.        Assessment & Plan:   Physical exam: Screening blood work     ordered Immunizations      Discussed shingrix -  on list, td discussed, others up to date Colonoscopy      Up to date  Mammogram    Up to date  Gyn    No longer sees gyn Dexa   Up to date  Eye exams   Up to date  EKG       Done   11/2016 Exercise    Goes to gym - not as much as she should, 2-3 times a week Weight  Normal BMI Skin    No concerns Substance abuse   none  See Problem List for Assessment and Plan of chronic medical problems.   FU in 6 months

## 2018-01-07 ENCOUNTER — Other Ambulatory Visit (INDEPENDENT_AMBULATORY_CARE_PROVIDER_SITE_OTHER): Payer: PPO

## 2018-01-07 ENCOUNTER — Encounter: Payer: Self-pay | Admitting: Internal Medicine

## 2018-01-07 ENCOUNTER — Ambulatory Visit (INDEPENDENT_AMBULATORY_CARE_PROVIDER_SITE_OTHER): Payer: PPO | Admitting: Internal Medicine

## 2018-01-07 VITALS — BP 128/86 | HR 62 | Temp 98.1°F | Resp 16 | Ht 62.0 in | Wt 132.0 lb

## 2018-01-07 DIAGNOSIS — Z0001 Encounter for general adult medical examination with abnormal findings: Secondary | ICD-10-CM

## 2018-01-07 DIAGNOSIS — E7849 Other hyperlipidemia: Secondary | ICD-10-CM

## 2018-01-07 DIAGNOSIS — I872 Venous insufficiency (chronic) (peripheral): Secondary | ICD-10-CM

## 2018-01-07 DIAGNOSIS — R739 Hyperglycemia, unspecified: Secondary | ICD-10-CM | POA: Diagnosis not present

## 2018-01-07 DIAGNOSIS — I10 Essential (primary) hypertension: Secondary | ICD-10-CM | POA: Diagnosis not present

## 2018-01-07 DIAGNOSIS — R011 Cardiac murmur, unspecified: Secondary | ICD-10-CM | POA: Diagnosis not present

## 2018-01-07 LAB — CBC WITH DIFFERENTIAL/PLATELET
BASOS ABS: 0 10*3/uL (ref 0.0–0.1)
Basophils Relative: 0.6 % (ref 0.0–3.0)
Eosinophils Absolute: 0.1 10*3/uL (ref 0.0–0.7)
Eosinophils Relative: 1.6 % (ref 0.0–5.0)
HEMATOCRIT: 39.8 % (ref 36.0–46.0)
Hemoglobin: 13.6 g/dL (ref 12.0–15.0)
LYMPHS PCT: 23.1 % (ref 12.0–46.0)
Lymphs Abs: 1.2 10*3/uL (ref 0.7–4.0)
MCHC: 34.2 g/dL (ref 30.0–36.0)
MCV: 88.9 fl (ref 78.0–100.0)
MONOS PCT: 7.2 % (ref 3.0–12.0)
Monocytes Absolute: 0.4 10*3/uL (ref 0.1–1.0)
NEUTROS ABS: 3.5 10*3/uL (ref 1.4–7.7)
Neutrophils Relative %: 67.5 % (ref 43.0–77.0)
PLATELETS: 303 10*3/uL (ref 150.0–400.0)
RBC: 4.48 Mil/uL (ref 3.87–5.11)
RDW: 13.8 % (ref 11.5–15.5)
WBC: 5.2 10*3/uL (ref 4.0–10.5)

## 2018-01-07 LAB — LIPID PANEL
CHOL/HDL RATIO: 3
Cholesterol: 253 mg/dL — ABNORMAL HIGH (ref 0–200)
HDL: 80.5 mg/dL (ref >39.00)
LDL CALC: 158 mg/dL — AB (ref 0–99)
NonHDL: 172.36
TRIGLYCERIDES: 70 mg/dL (ref 0.0–149.0)
VLDL: 14 mg/dL (ref 0.0–40.0)

## 2018-01-07 LAB — COMPREHENSIVE METABOLIC PANEL
ALT: 18 U/L (ref 0–35)
AST: 20 U/L (ref 0–37)
Albumin: 4.4 g/dL (ref 3.5–5.2)
Alkaline Phosphatase: 51 U/L (ref 39–117)
BILIRUBIN TOTAL: 0.5 mg/dL (ref 0.2–1.2)
BUN: 11 mg/dL (ref 6–23)
CALCIUM: 9.6 mg/dL (ref 8.4–10.5)
CHLORIDE: 100 meq/L (ref 96–112)
CO2: 27 meq/L (ref 19–32)
Creatinine, Ser: 0.7 mg/dL (ref 0.40–1.20)
GFR: 87.6 mL/min (ref >60.00)
Glucose, Bld: 90 mg/dL (ref 70–99)
Potassium: 4.2 mEq/L (ref 3.5–5.1)
Sodium: 135 mEq/L (ref 135–145)
Total Protein: 7.2 g/dL (ref 6.0–8.3)

## 2018-01-07 LAB — HEMOGLOBIN A1C: Hgb A1c MFr Bld: 5.6 % (ref 4.6–6.5)

## 2018-01-07 LAB — TSH: TSH: 3.26 u[IU]/mL (ref 0.35–4.50)

## 2018-01-07 MED ORDER — VASCULERA PO TABS: 1.0000 | ORAL_TABLET | Freq: Every day | ORAL | 3 refills | Status: DC

## 2018-01-07 NOTE — Assessment & Plan Note (Signed)
2/6 systolic heart murmur  will monitor

## 2018-01-07 NOTE — Assessment & Plan Note (Addendum)
Lipid panel today Not on medication - did not tolerate lipitor and wants to avoid other meds Increase exercise

## 2018-01-07 NOTE — Assessment & Plan Note (Signed)
BP well controlled Current regimen effective and well tolerated Continue current medications at current doses cmp  

## 2018-01-07 NOTE — Assessment & Plan Note (Signed)
a1c

## 2018-01-07 NOTE — Assessment & Plan Note (Signed)
Taking vasculera - will continue

## 2018-01-17 ENCOUNTER — Ambulatory Visit: Payer: PPO | Admitting: Physician Assistant

## 2018-01-17 ENCOUNTER — Encounter: Payer: Self-pay | Admitting: Physician Assistant

## 2018-01-17 ENCOUNTER — Ambulatory Visit: Payer: PPO | Admitting: Rheumatology

## 2018-01-17 VITALS — BP 124/81 | HR 58 | Resp 16 | Ht 62.0 in | Wt 136.0 lb

## 2018-01-17 DIAGNOSIS — Z8639 Personal history of other endocrine, nutritional and metabolic disease: Secondary | ICD-10-CM

## 2018-01-17 DIAGNOSIS — M19041 Primary osteoarthritis, right hand: Secondary | ICD-10-CM

## 2018-01-17 DIAGNOSIS — R5383 Other fatigue: Secondary | ICD-10-CM

## 2018-01-17 DIAGNOSIS — M4316 Spondylolisthesis, lumbar region: Secondary | ICD-10-CM

## 2018-01-17 DIAGNOSIS — M19042 Primary osteoarthritis, left hand: Secondary | ICD-10-CM

## 2018-01-17 DIAGNOSIS — M797 Fibromyalgia: Secondary | ICD-10-CM

## 2018-01-17 DIAGNOSIS — Z8719 Personal history of other diseases of the digestive system: Secondary | ICD-10-CM | POA: Diagnosis not present

## 2018-01-17 DIAGNOSIS — M7062 Trochanteric bursitis, left hip: Secondary | ICD-10-CM

## 2018-01-17 DIAGNOSIS — M48062 Spinal stenosis, lumbar region with neurogenic claudication: Secondary | ICD-10-CM

## 2018-01-17 DIAGNOSIS — M7061 Trochanteric bursitis, right hip: Secondary | ICD-10-CM | POA: Diagnosis not present

## 2018-01-17 DIAGNOSIS — M1220 Villonodular synovitis (pigmented), unspecified site: Secondary | ICD-10-CM

## 2018-01-17 DIAGNOSIS — F5101 Primary insomnia: Secondary | ICD-10-CM

## 2018-01-17 DIAGNOSIS — I872 Venous insufficiency (chronic) (peripheral): Secondary | ICD-10-CM | POA: Diagnosis not present

## 2018-01-17 DIAGNOSIS — Z8739 Personal history of other diseases of the musculoskeletal system and connective tissue: Secondary | ICD-10-CM

## 2018-01-17 DIAGNOSIS — Z8679 Personal history of other diseases of the circulatory system: Secondary | ICD-10-CM | POA: Diagnosis not present

## 2018-01-17 DIAGNOSIS — Z85828 Personal history of other malignant neoplasm of skin: Secondary | ICD-10-CM

## 2018-03-17 ENCOUNTER — Ambulatory Visit (INDEPENDENT_AMBULATORY_CARE_PROVIDER_SITE_OTHER): Payer: PPO | Admitting: Internal Medicine

## 2018-03-17 ENCOUNTER — Ambulatory Visit (INDEPENDENT_AMBULATORY_CARE_PROVIDER_SITE_OTHER)
Admission: RE | Admit: 2018-03-17 | Discharge: 2018-03-17 | Disposition: A | Discharge status: Home / Self Care | Payer: PPO | Source: Ambulatory Visit | Attending: Internal Medicine | Admitting: Internal Medicine

## 2018-03-17 ENCOUNTER — Other Ambulatory Visit: Payer: PPO

## 2018-03-17 ENCOUNTER — Encounter: Payer: Self-pay | Admitting: Internal Medicine

## 2018-03-17 VITALS — BP 134/88 | HR 79 | Temp 98.4°F | Resp 16 | Wt 133.0 lb

## 2018-03-17 DIAGNOSIS — R05 Cough: Secondary | ICD-10-CM | POA: Diagnosis not present

## 2018-03-17 DIAGNOSIS — R053 Chronic cough: Secondary | ICD-10-CM | POA: Insufficient documentation

## 2018-03-17 DIAGNOSIS — R059 Cough, unspecified: Secondary | ICD-10-CM | POA: Insufficient documentation

## 2018-03-17 NOTE — Assessment & Plan Note (Addendum)
No evidence of active infection She has a history of allergies and her cough is likely related to allergies Other possibilities - silent GERD, side effect from losartan CXR today to rule out lung pathology, such as tumors Start oral anti-histamine daily for 4 weeks Can consider PPI or zantac as well If cough is persistent we can try switching losartan

## 2018-03-17 NOTE — Progress Notes (Signed)
Subjective:    Patient ID: Heather Ramsey, female    DOB: [birth year], 72 y.o.   MRN: [id number]  HPI The patient is here for an acute visit.   Cough:  She has had a dry cough for over 6 months.  She denies any pattern.  She thinks it may be from allergies.  She coughs daily.  It is not related to any specific time of day or activity.  She has had seasonal allergies over the past two years.  This spring her allergies were worse.  The cough seemed to be worse after going outside on a few occasions.  She is currently not taking anything for her allergies.  `  She denies GERD.  She denies wheeze or SOB.  She is taking losartan daily.    Medications and allergies reviewed with patient and updated if appropriate.  Patient Active Problem List   Diagnosis Date Noted  . Cardiac murmur 01/07/2018  . History of IBS 01/03/2018  . Osteoporosis 08/18/2017  . Hyperglycemia 07/30/2017  . Deficiency anemia 04/09/2017  . Rash 04/09/2017  . Localized edema 04/09/2017  . Venous stasis dermatitis of both lower extremities 04/09/2017  . Spondylolisthesis at L4-L5 level 02/26/2017  . Urinary hesitancy 01/28/2017  . Fibromyalgia 01/03/2017  . Primary insomnia 01/03/2017  . Other fatigue 01/03/2017  . Primary osteoarthritis of both hands 01/03/2017  . Trochanteric bursitis of both hips 01/03/2017  . Hypertension 12/29/2016  . 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 Lumbar radiculopathy 09/04/2016  . 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 Abnormal EKG 02/05/2012  . Atrophic vaginitis   . Basal cell cancer 04/24/2011  . DIVERTICULOSIS, COLON 06/09/2009  . Hyperlipidemia 05/31/2008  . IRRITABLE BOWEL SYNDROME 05/27/2008  . Myalgia and myositis 05/27/2008  . Osteopenia 05/27/2008  . Chronic fatigue syndrome 05/27/2008    Current Outpatient Medications on File Prior to Visit  Medication Sig Dispense Refill  . amLODipine (NORVASC) 2.5 MG  tablet Take 1 tablet (2.5 mg total) daily by mouth. 90 tablet 3  . b complex vitamins tablet Take 1 tablet by mouth daily.      . Calcium Carbonate-Vitamin D (CALCIUM + D PO) Take 1 tablet by mouth 2 (two) times daily.     . Chromium 200 MCG TABS Take 1 tablet by mouth daily.    . Coenzyme Q10 (CO Q 10 PO) Take by mouth daily.    . Dietary Management Product (VASCULERA) TABS Take 1 capsule by mouth daily. 90 tablet 3  . Docosahexaenoic Acid (DHA ALGAL-900 PO) Take 900 mg by mouth daily.    . fish oil-omega-3 fatty acids 1000 MG capsule Take 1-2 g by mouth See admin instructions. 1 cap in the morning, 2 caps in the evening    . Ginger, Zingiber officinalis, (GINGER ROOT) 550 MG CAPS Take 1 capsule by mouth daily.     Marland Kitchen losartan (COZAAR) 100 MG tablet Take 0.5 tablets (50 mg total) by mouth daily. 45 tablet 3  . magnesium 30 MG tablet Take 30 mg by mouth 2 (two) times daily.      . Misc Natural Products (TART CHERRY ADVANCED PO) Take by mouth daily.    . Multiple Vitamins-Minerals (ICAPS AREDS 2 PO) Take 1 capsule by mouth daily.    . Probiotic Product (PROBIOTIC-10 PO) Take by mouth daily.    . Psyllium (METAMUCIL) 28.3 % POWD Take 1 scoop dissolved in at least 8 ounces water/juice and drink twice  daily (Patient taking differently: as needed. Take 1 scoop dissolved in at least 8 ounces water/juice and drink twice daily) 1 Bottle 0  . TURMERIC PO Take 400 mg by mouth daily.     No current facility-administered medications on file prior to visit.     Past Medical History:  Diagnosis Date  . Allergy   . Atrophic vaginitis   . Chest pain    negative  cardiac evaluation; Dr Percival Spanish  . Diverticulosis of colon 2005& 2010   FH colon cancer  . Fibromyalgia    Dr Estanislado Pandy  . GERD (gastroesophageal reflux disease)   . Heart murmur   . Hemorrhoid   . Hyperlipidemia   . Hypertension   . IBS (irritable bowel syndrome)   . Migraine   . MVP (mitral valve prolapse)   . Osteopenia   . Pulmonary  nodule 07/2006  . UTI (lower urinary tract infection) 2005   Citrobacter koseri, hospitalized w/ kidney infection 1983    Past Surgical History:  Procedure Laterality Date  . back surgy  02/26/2017   lumbar 4-5  . BREAST BIOPSY Left 2003  . [place]   with bladder tack   . COLONOSCOPY  07/2014   negative; Dr Olevia Perches  . FOOT SURGERY Left 2014    Social History   Socioeconomic History  . Marital status: Married    Spouse name: Not on file  . Number of children: 2  . Years of education: Not on file  . Highest education level: Not on file  Occupational History  . Not on file  Social Needs  . Financial resource strain: Not on file  . Food insecurity:    Worry: Not on file    Inability: Not on file  . Transportation needs:    Medical: Not on file    Non-medical: Not on file  Tobacco Use  . Smoking status: Never Smoker  . Smokeless tobacco: Never Used  Substance and Sexual Activity  . Alcohol use: No  . Drug use: No  . Sexual activity: Yes    Birth control/protection: Post-menopausal  Lifestyle  . Physical activity:    Days per week: Not on file    Minutes per session: Not on file  . Stress: Not on file  Relationships  . Social connections:    Talks on phone: Not on file    Gets together: Not on file    Attends religious service: Not on file    Active member of club or organization: Not on file    Attends meetings of clubs or organizations: Not on file    Relationship status: Not on file  Other Topics Concern  . Not on file  Social History Narrative   Regular exercise- yes: walks, but not as much due to knee arthritis.  Does stretching   Lives with husband.          Family History  Problem Relation Age of Onset  . Heart attack Father 94       Died with multiple medical problems  . Colon cancer Father 34  . Diabetes Father   . Tuberculosis Paternal Grandmother   . Stroke Mother 12  . Hypothyroidism Mother   . Hypertension Mother   .  Hypothyroidism Sister   . Hypertension Sister   . Breast cancer Sister   . Diabetes Sister   . Heart attack Sister        2 sisters > 8    Review of Systems  Constitutional: Negative for chills and fever.  HENT: Negative for congestion, ear pain, postnasal drip, sinus pressure, sore throat (occ scratchy throat) and trouble swallowing.   Respiratory: Positive for cough. Negative for chest tightness, shortness of breath and wheezing.   Cardiovascular: Positive for leg swelling (right ankle intermittent). Negative for chest pain and palpitations.  Gastrointestinal: Negative for abdominal pain and nausea.       No gerd  Neurological: Positive for headaches (occ). Negative for light-headedness.       Objective:   Vitals:   03/17/18 1017  BP: 134/88  Pulse: 79  Resp: 16  Temp: 98.4 F (36.9 C)  SpO2: 97%   BP Readings from Last 3 Encounters:  03/17/18 134/88  01/17/18 124/81  01/07/18 128/86   Wt Readings from Last 3 Encounters:  03/17/18 133 lb (60.3 kg)  01/17/18 136 lb (61.7 kg)  01/07/18 132 lb (59.9 kg)   Body mass index is 24.33 kg/m.   Physical Exam    GENERAL APPEARANCE: Appears stated age, well appearing, NAD EYES: conjunctiva clear, no icterus HEENT: bilateral tympanic membranes and ear canals normal, oropharynx with no erythema, no thyromegaly, trachea midline, no cervical or supraclavicular lymphadenopathy LUNGS: Clear to auscultation without wheeze or crackles, unlabored breathing, good air entry bilaterally CARDIOVASCULAR: Normal Z8,H8,  2/6 systolic murmur RSB, no edema; varicose veins b/l LE SKIN: Warm, dry      Assessment & Plan:    See Problem List for Assessment and Plan of chronic medical problems.

## 2018-03-17 NOTE — Patient Instructions (Signed)
Have a chest xray today.    Try taking zyrtec or xyzal at night for possible allergies.  You could also try claritin or allegra during the day.

## 2018-04-18 ENCOUNTER — Encounter: Payer: Self-pay | Admitting: Internal Medicine

## 2018-05-06 DIAGNOSIS — M899 Disorder of bone, unspecified: Secondary | ICD-10-CM | POA: Diagnosis not present

## 2018-05-06 DIAGNOSIS — M1220 Villonodular synovitis (pigmented), unspecified site: Secondary | ICD-10-CM | POA: Diagnosis not present

## 2018-05-06 NOTE — Progress Notes (Addendum)
Subjective:   Heather Ramsey is a 72 y.o. female who presents for Medicare Annual (Subsequent) preventive examination.  Review of Systems:  No ROS.  Medicare Wellness Visit. Additional risk factors are reflected in the social history.  Cardiac Risk Factors include: advanced age (>15men, >54 women);dyslipidemia;hypertension Sleep patterns: has interrupted sleep, gets up 1-2 times nightly to void and sleeps 5-7 hours nightly. Patient reports insomnia issues, discussed recommended sleep tips and stress reduction tips.  Home Safety/Smoke Alarms: Feels safe in home. Smoke alarms in place.  Living environment; residence and Firearm Safety: 2-story house, no firearms. Lives with husband, no needs for DME, good support system Seat Belt Safety/Bike Helmet: Wears seat belt.     Objective:     Vitals: BP 118/62   Pulse (!) 58   Resp 18   Ht 5\' 2"  (1.575 m)   Wt 133 lb (60.3 kg)   SpO2 96%   BMI 24.33 kg/m   Body mass index is 24.33 kg/m.  Advanced Directives 05/07/2018 05/02/2017 02/20/2017 07/03/2016 07/22/2014  Does Patient Have a Medical Advance Directive? Yes Yes Yes Yes Yes  Type of Paramedic of [place];Living will [place];Living will Living will [place];Living will [place];Living will  Does patient want to make changes to medical advance directive? - - No - Patient declined No - Patient declined -  Copy of [place] in Chart? No - copy requested No - copy requested - No - copy requested -    Tobacco Social History   Tobacco Use  Smoking Status Never Smoker  Smokeless Tobacco Never Used     Counseling given: Not Answered  Past Medical History:  Diagnosis Date  . Allergy   . Atrophic vaginitis   . Chest pain    negative  cardiac evaluation; Dr Percival Spanish  . Diverticulosis of colon 2005& 2010   FH colon cancer  . Fibromyalgia    Dr Estanislado Pandy  . GERD  (gastroesophageal reflux disease)   . Heart murmur   . Hemorrhoid   . Hyperlipidemia   . Hypertension   . IBS (irritable bowel syndrome)   . Migraine   . MVP (mitral valve prolapse)   . Osteopenia   . Pulmonary nodule 07/2006  . UTI (lower urinary tract infection) 2005   Citrobacter koseri, hospitalized w/ kidney infection 1983   Past Surgical History:  Procedure Laterality Date  . back surgy  02/26/2017   lumbar 4-5  . BREAST BIOPSY Left 2003  . [place]   with bladder tack   . COLONOSCOPY  07/2014   negative; Dr Olevia Perches  . FOOT SURGERY Left 2014   Family History  Problem Relation Age of Onset  . Heart attack Father 80       Died with multiple medical problems  . Colon cancer Father 12  . Diabetes Father   . Tuberculosis Paternal Grandmother   . Stroke Mother 4  . Hypothyroidism Mother   . Hypertension Mother   . Hypothyroidism Sister   . Hypertension Sister   . Breast cancer Sister   . Diabetes Sister   . Heart attack Sister        2 sisters > 105   Social History   Socioeconomic History  . Marital status: Married    Spouse name: Not on file  . Number of children: 2  . Years of education: Not on file  . Highest education level: Not  on file  Occupational History  . Not on file  Social Needs  . Financial resource strain: Not hard at all  . Food insecurity:    Worry: Never true    Inability: Never true  . Transportation needs:    Medical: No    Non-medical: No  Tobacco Use  . Smoking status: Never Smoker  . Smokeless tobacco: Never Used  Substance and Sexual Activity  . Alcohol use: No  . Drug use: No  . Sexual activity: Yes    Birth control/protection: Post-menopausal  Lifestyle  . Physical activity:    Days per week: 4 days    Minutes per session: 40 min  . Stress: To some extent  Relationships  . Social connections:    Talks on phone: More than three times a week    Gets together: More than three times a week    Attends  religious service: Never    Active member of club or organization: Yes    Attends meetings of clubs or organizations: More than 4 times per year    Relationship status: Married  Other Topics Concern  . Not on file  Social History Narrative   Regular exercise- yes: walks, but not as much due to knee arthritis.  Does stretching   Lives with husband.          Outpatient Encounter Medications as of 05/07/2018  Medication Sig  . amLODipine (NORVASC) 2.5 MG tablet Take 1 tablet (2.5 mg total) daily by mouth.  Marland Kitchen b complex vitamins tablet Take 1 tablet by mouth daily.    . Calcium Carbonate-Vitamin D (CALCIUM + D PO) Take 1 tablet by mouth 2 (two) times daily.   . Dietary Management Product (VASCULERA) TABS Take 1 capsule by mouth daily.  . Docosahexaenoic Acid (DHA ALGAL-900 PO) Take 900 mg by mouth daily.  . fish oil-omega-3 fatty acids 1000 MG capsule Take 1-2 g by mouth See admin instructions. 1 cap in the morning, 2 caps in the evening  . Ginger, Zingiber officinalis, (GINGER ROOT) 550 MG CAPS Take 1 capsule by mouth daily.   Marland Kitchen losartan (COZAAR) 100 MG tablet Take 0.5 tablets (50 mg total) by mouth daily.  . magnesium 30 MG tablet Take 30 mg by mouth 2 (two) times daily.    . Misc Natural Products (TART CHERRY ADVANCED PO) Take by mouth daily.  . Multiple Vitamins-Minerals (ICAPS AREDS 2 PO) Take 1 capsule by mouth daily.  Marland Kitchen omeprazole (PRILOSEC) 10 MG capsule Take 10 mg by mouth daily.  . Probiotic Product (PROBIOTIC-10 PO) Take by mouth daily.  . Psyllium (METAMUCIL) 28.3 % POWD Take 1 scoop dissolved in at least 8 ounces water/juice and drink twice daily (Patient taking differently: as needed. Take 1 scoop dissolved in at least 8 ounces water/juice and drink twice daily)  . TURMERIC PO Take 400 mg by mouth daily.  . [DISCONTINUED] Chromium 200 MCG TABS Take 1 tablet by mouth daily.  . [DISCONTINUED] Coenzyme Q10 (CO Q 10 PO) Take by mouth daily.   No facility-administered encounter  medications on file as of 05/07/2018.     Activities of Daily Living In your present state of health, do you have any difficulty performing the following activities: 05/07/2018  Hearing? N  Vision? N  Difficulty concentrating or making decisions? N  Walking or climbing stairs? N  Dressing or bathing? N  Doing errands, shopping? N  Preparing Food and eating ? N  Using the Toilet? N  In the  past six months, have you accidently leaked urine? N  Do you have problems with loss of bowel control? N  Managing your Medications? N  Managing your Finances? N  Housekeeping or managing your Housekeeping? N  Some recent data might be hidden    Patient Care Team: Binnie Rail, MD as PCP - General (Internal Medicine) Kristeen Miss, MD as Consulting Physician (Neurosurgery)    Assessment:   This is a routine wellness examination for Heather Ramsey. Physical assessment deferred to PCP.   Exercise Activities and Dietary recommendations Current Exercise Habits: Home exercise routine, Type of exercise: walking, Time (Minutes): 35, Frequency (Times/Week): 3, Weekly Exercise (Minutes/Week): 105, Intensity: Mild, Exercise limited by: None identified  Diet (meal preparation, eat out, water intake, caffeinated beverages, dairy products, fruits and vegetables): in general, a "healthy" diet  , well balanced   Reviewed heart healthy diet. Encouraged patient to increase daily water and healthy fluid intake.   Goals    . Increase my physical activity as tolerated after back surgery.     Continue to be as healthy as possible, enjoy life, nature, family and church.    . Patient Stated     Take time for me daily to read and relax. After the move, I will start to walk several times weekly.       Fall Risk Fall Risk  05/07/2018 07/30/2017 05/02/2017 11/26/2016 10/27/2014  Falls in the past year? No No No No No    Depression Screen PHQ 2/9 Scores 05/07/2018 07/30/2017 05/02/2017 11/26/2016  PHQ - 2 Score 1 0 0 0  PHQ-  9 Score - - 1 -     Cognitive Function       Ad8 score reviewed for issues:  Issues making decisions: no  Less interest in hobbies / activities: no  Repeats questions, stories (family complaining): no  Trouble using ordinary gadgets (microwave, computer, phone):no  Forgets the month or year: no  Mismanaging finances: no  Remembering appts: no  Daily problems with thinking and/or memory: no Ad8 score is= 0  Immunization History  Administered Date(s) Administered  . Influenza Whole 07/09/2007, 06/21/2010, 07/25/2012, 07/24/2013  . Influenza, High Dose Seasonal PF 07/20/2014, 07/04/2015  . Influenza,inj,Quad PF,6+ Mos 06/20/2017  . Influenza-Unspecified 07/03/2016  . Pneumococcal Conjugate-13 08/31/2014, 11/15/2015  . Pneumococcal Polysaccharide-23 05/27/2008, 10/21/2013  . Td 01/01/2008  . Zoster 09/24/2008  . Zoster Recombinat (Shingrix) 02/06/2018, 04/11/2018   Screening Tests Health Maintenance  Topic Date Due  . INFLUENZA VACCINE  04/24/2018  . TETANUS/TDAP  01/08/2019 (Originally 12/31/2017)  . MAMMOGRAM  08/03/2019  . DEXA SCAN  08/03/2019  . COLONOSCOPY  08/05/2019  . Hepatitis C Screening  Completed  . PNA vac Low Risk Adult  Completed      Plan:     Continue doing brain stimulating activities (puzzles, reading, adult coloring books, staying active) to keep memory sharp.   Continue to eat heart healthy diet (full of fruits, vegetables, whole grains, lean protein, water--limit salt, fat, and sugar intake) and increase physical activity as tolerated.  I have personally reviewed and noted the following in the patient's chart:   . Medical and social history . Use of alcohol, tobacco or illicit drugs  . Current medications and supplements . Functional ability and status . Nutritional status . Physical activity . Advanced directives . List of other physicians . Vitals . Screenings to include cognitive, depression, and falls . Referrals and  appointments  In addition, I have reviewed and discussed with patient  certain preventive protocols, quality metrics, and best practice recommendations. A written personalized care plan for preventive services as well as general preventive health recommendations were provided to patient.     Michiel Cowboy, RN  05/07/2018   Medical screening examination/treatment/procedure(s) were performed by non-physician practitioner and as supervising physician I was immediately available for consultation/collaboration. I agree with above. Binnie Rail, MD

## 2018-05-07 ENCOUNTER — Ambulatory Visit (INDEPENDENT_AMBULATORY_CARE_PROVIDER_SITE_OTHER): Payer: PPO | Admitting: *Deleted

## 2018-05-07 ENCOUNTER — Telehealth: Payer: Self-pay | Admitting: *Deleted

## 2018-05-07 VITALS — BP 118/62 | HR 58 | Resp 18 | Ht 62.0 in | Wt 133.0 lb

## 2018-05-07 DIAGNOSIS — Z0000 Encounter for general adult medical examination without abnormal findings: Secondary | ICD-10-CM | POA: Diagnosis not present

## 2018-05-07 NOTE — Patient Instructions (Addendum)
If you or someone you know has experienced the death of a loved one, the support of others can play an invaluable role in the healing process. [facility] offers grief and loss services for anyone in the community. Contact us today [phone]  Continue doing brain stimulating activities (puzzles, reading, adult coloring books, staying active) to keep memory sharp.   Continue to eat heart healthy diet (full of fruits, vegetables, whole grains, lean protein, water--limit salt, fat, and sugar intake) and increase physical activity as tolerated.   Heather Ramsey , Thank you for taking time to come for your Medicare Wellness Visit. I appreciate your ongoing commitment to your health goals. Please review the following plan we discussed and let me know if I can assist you in the future.   These are the goals we discussed: Goals    . Increase my physical activity as tolerated after back surgery.     Continue to be as healthy as possible, enjoy life, nature, family and church.    . Patient Stated     Take time for me daily to read and relax. After the move, I will start to walk several times weekly.       This is a list of the screening recommended for you and due dates:  Health Maintenance  Topic Date Due  . Flu Shot  04/24/2018  . Tetanus Vaccine  01/08/2019*  . Mammogram  08/03/2019  . DEXA scan (bone density measurement)  08/03/2019  . Colon Cancer Screening  08/05/2019  .  Hepatitis C: One time screening is recommended by Center for Disease Control  (CDC) for  adults born from [birth year] through 1964.   Completed  . Pneumonia vaccines  Completed  *Topic was postponed. The date shown is not the original due date.     Health Maintenance, Female Adopting a healthy lifestyle and getting preventive care can go a long way to promote health and wellness. Talk with your health care provider about what schedule of regular examinations is right for you. This is a good chance  for you to check in with your provider about disease prevention and staying healthy. In between checkups, there are plenty of things you can do on your own. Experts have done a lot of research about which lifestyle changes and preventive measures are most likely to keep you healthy. Ask your health care provider for more information. Weight and diet Eat a healthy diet  Be sure to include plenty of vegetables, fruits, low-fat dairy products, and lean protein.  Do not eat a lot of foods high in solid fats, added sugars, or salt.  Get regular exercise. This is one of the most important things you can do for your health. ? Most adults should exercise for at least 150 minutes each week. The exercise should increase your heart rate and make you sweat (moderate-intensity exercise). ? Most adults should also do strengthening exercises at least twice a week. This is in addition to the moderate-intensity exercise.  Maintain a healthy weight  Body mass index (BMI) is a measurement that can be used to identify possible weight problems. It estimates body fat based on height and weight. Your health care provider can help determine your BMI and help you achieve or maintain a healthy weight.  For females 36 years of age and older: ? A BMI below 18.5 is considered underweight. ? A BMI of 18.5 to 24.9 is normal. ? A BMI of 25 to 29.9 is  considered overweight. ? A BMI of 30 and above is considered obese.  Watch levels of cholesterol and blood lipids  You should start having your blood tested for lipids and cholesterol at 71 years of age, then have this test every 5 years.  You may need to have your cholesterol levels checked more often if: ? Your lipid or cholesterol levels are high. ? You are older than 71 years of age. ? You are at high risk for heart disease.  Cancer screening Lung Cancer  Lung cancer screening is recommended for adults 50-64 years old who are at high risk for lung cancer because  of a history of smoking.  A yearly low-dose CT scan of the lungs is recommended for people who: ? Currently smoke. ? Have quit within the past 15 years. ? Have at least a 30-pack-year history of smoking. A pack year is smoking an average of one pack of cigarettes a day for 1 year.  Yearly screening should continue until it has been 15 years since you quit.  Yearly screening should stop if you develop a health problem that would prevent you from having lung cancer treatment.  Breast Cancer  Practice breast self-awareness. This means understanding how your breasts normally appear and feel.  It also means doing regular breast self-exams. Let your health care provider know about any changes, no matter how small.  If you are in your 20s or 30s, you should have a clinical breast exam (CBE) by a health care provider every 1-3 years as part of a regular health exam.  If you are 38 or older, have a CBE every year. Also consider having a breast X-ray (mammogram) every year.  If you have a family history of breast cancer, talk to your health care provider about genetic screening.  If you are at high risk for breast cancer, talk to your health care provider about having an MRI and a mammogram every year.  Breast cancer gene (BRCA) assessment is recommended for women who have family members with BRCA-related cancers. BRCA-related cancers include: ? Breast. ? Ovarian. ? Tubal. ? Peritoneal cancers.  Results of the assessment will determine the need for genetic counseling and BRCA1 and BRCA2 testing.  Cervical Cancer Your health care provider may recommend that you be screened regularly for cancer of the pelvic organs (ovaries, uterus, and vagina). This screening involves a pelvic examination, including checking for microscopic changes to the surface of your cervix (Pap test). You may be encouraged to have this screening done every 3 years, beginning at age 66.  For women ages 34-65, health care  providers may recommend pelvic exams and Pap testing every 3 years, or they may recommend the Pap and pelvic exam, combined with testing for human papilloma virus (HPV), every 5 years. Some types of HPV increase your risk of cervical cancer. Testing for HPV may also be done on women of any age with unclear Pap test results.  Other health care providers may not recommend any screening for nonpregnant women who are considered low risk for pelvic cancer and who do not have symptoms. Ask your health care provider if a screening pelvic exam is right for you.  If you have had past treatment for cervical cancer or a condition that could lead to cancer, you need Pap tests and screening for cancer for at least 20 years after your treatment. If Pap tests have been discontinued, your risk factors (such as having a new sexual partner) need to be reassessed  to determine if screening should resume. Some women have medical problems that increase the chance of getting cervical cancer. In these cases, your health care provider may recommend more frequent screening and Pap tests.  Colorectal Cancer  This type of cancer can be detected and often prevented.  Routine colorectal cancer screening usually begins at 71 years of age and continues through 71 years of age.  Your health care provider may recommend screening at an earlier age if you have risk factors for colon cancer.  Your health care provider may also recommend using home test kits to check for hidden blood in the stool.  A small camera at the end of a tube can be used to examine your colon directly (sigmoidoscopy or colonoscopy). This is done to check for the earliest forms of colorectal cancer.  Routine screening usually begins at age 45.  Direct examination of the colon should be repeated every 5-10 years through 72 years of age. However, you may need to be screened more often if early forms of precancerous polyps or small growths are found.  Skin  Cancer  Check your skin from head to toe regularly.  Tell your health care provider about any new moles or changes in moles, especially if there is a change in a mole's shape or color.  Also tell your health care provider if you have a mole that is larger than the size of a pencil eraser.  Always use sunscreen. Apply sunscreen liberally and repeatedly throughout the day.  Protect yourself by wearing long sleeves, pants, a wide-brimmed hat, and sunglasses whenever you are outside.  Heart disease, diabetes, and high blood pressure  High blood pressure causes heart disease and increases the risk of stroke. High blood pressure is more likely to develop in: ? People who have blood pressure in the high end of the normal range (130-139/85-89 mm Hg). ? People who are overweight or obese. ? People who are African American.  If you are 46-2 years of age, have your blood pressure checked every 3-5 years. If you are 77 years of age or older, have your blood pressure checked every year. You should have your blood pressure measured twice-once when you are at a hospital or clinic, and once when you are not at a hospital or clinic. Record the average of the two measurements. To check your blood pressure when you are not at a hospital or clinic, you can use: ? An automated blood pressure machine at a pharmacy. ? A home blood pressure monitor.  If you are between 23 years and 52 years old, ask your health care provider if you should take aspirin to prevent strokes.  Have regular diabetes screenings. This involves taking a blood sample to check your fasting blood sugar level. ? If you are at a normal weight and have a low risk for diabetes, have this test once every three years after 71 years of age. ? If you are overweight and have a high risk for diabetes, consider being tested at a younger age or more often. Preventing infection Hepatitis B  If you have a higher risk for hepatitis B, you should be  screened for this virus. You are considered at high risk for hepatitis B if: ? You were born in a country where hepatitis B is common. Ask your health care provider which countries are considered high risk. ? Your parents were born in a high-risk country, and you have not been immunized against hepatitis B (hepatitis B vaccine). ?  You have HIV or AIDS. ? You use needles to inject street drugs. ? You live with someone who has hepatitis B. ? You have had sex with someone who has hepatitis B. ? You get hemodialysis treatment. ? You take certain medicines for conditions, including cancer, organ transplantation, and autoimmune conditions.  Hepatitis C  Blood testing is recommended for: ? Everyone born from [birth year] through 1964. ? Anyone with known risk factors for hepatitis C.  Sexually transmitted infections (STIs)  You should be screened for sexually transmitted infections (STIs) including gonorrhea and chlamydia if: ? You are sexually active and are younger than 71 years of age. ? You are older than 71 years of age and your health care provider tells you that you are at risk for this type of infection. ? Your sexual activity has changed since you were last screened and you are at an increased risk for chlamydia or gonorrhea. Ask your health care provider if you are at risk.  If you do not have HIV, but are at risk, it may be recommended that you take a prescription medicine daily to prevent HIV infection. This is called pre-exposure prophylaxis (PrEP). You are considered at risk if: ? You are sexually active and do not regularly use condoms or know the HIV status of your partner(s). ? You take drugs by injection. ? You are sexually active with a partner who has HIV.  Talk with your health care provider about whether you are at high risk of being infected with HIV. If you choose to begin PrEP, you should first be tested for HIV. You should then be tested every 3 months for as long as you are  taking PrEP. Pregnancy  If you are premenopausal and you may become pregnant, ask your health care provider about preconception counseling.  If you may become pregnant, take 400 to 800 micrograms (mcg) of folic acid every day.  If you want to prevent pregnancy, talk to your health care provider about birth control (contraception). Osteoporosis and menopause  Osteoporosis is a disease in which the bones lose minerals and strength with aging. This can result in serious bone fractures. Your risk for osteoporosis can be identified using a bone density scan.  If you are 9 years of age or older, or if you are at risk for osteoporosis and fractures, ask your health care provider if you should be screened.  Ask your health care provider whether you should take a calcium or vitamin D supplement to lower your risk for osteoporosis.  Menopause may have certain physical symptoms and risks.  Hormone replacement therapy may reduce some of these symptoms and risks. Talk to your health care provider about whether hormone replacement therapy is right for you. Follow these instructions at home:  Schedule regular health, dental, and eye exams.  Stay current with your immunizations.  Do not use any tobacco products including cigarettes, chewing tobacco, or electronic cigarettes.  If you are pregnant, do not drink alcohol.  If you are breastfeeding, limit how much and how often you drink alcohol.  Limit alcohol intake to no more than 1 drink per day for nonpregnant women. One drink equals 12 ounces of beer, 5 ounces of wine, or 1 ounces of hard liquor.  Do not use street drugs.  Do not share needles.  Ask your health care provider for help if you need support or information about quitting drugs.  Tell your health care provider if you often feel depressed.  Tell your health  care provider if you have ever been abused or do not feel safe at home. This information is not intended to replace  advice given to you by your health care provider. Make sure you discuss any questions you have with your health care provider. Document Released: 03/26/2011 Document Revised: 02/16/2016 Document Reviewed: 06/14/2015 Elsevier Interactive Patient Education  [employer].

## 2018-05-07 NOTE — Telephone Encounter (Signed)
During AWV, patient states that vasculera is not effective as she would like and wants to know if the prescription can be increased to help.

## 2018-05-07 NOTE — Telephone Encounter (Signed)
Increasing the dose would most likely not be more effective. I can refer her to vascular surgery if she would like to have her circulation evaluated further.  Let me know.

## 2018-05-09 IMAGING — RF DG C-ARM 61-120 MIN
1 series · 2 of 2 positions shown · non-contrast
Comparison: Lumbar radiographs on 01/16/2017

CLINICAL DATA: Posterior lumbar fusion.

EXAM:
DG C-ARM 61-120 MIN; LUMBAR SPINE - 2-3 VIEW

[Series 1: run · 2 of 2 slices shown]
[im 1/2]
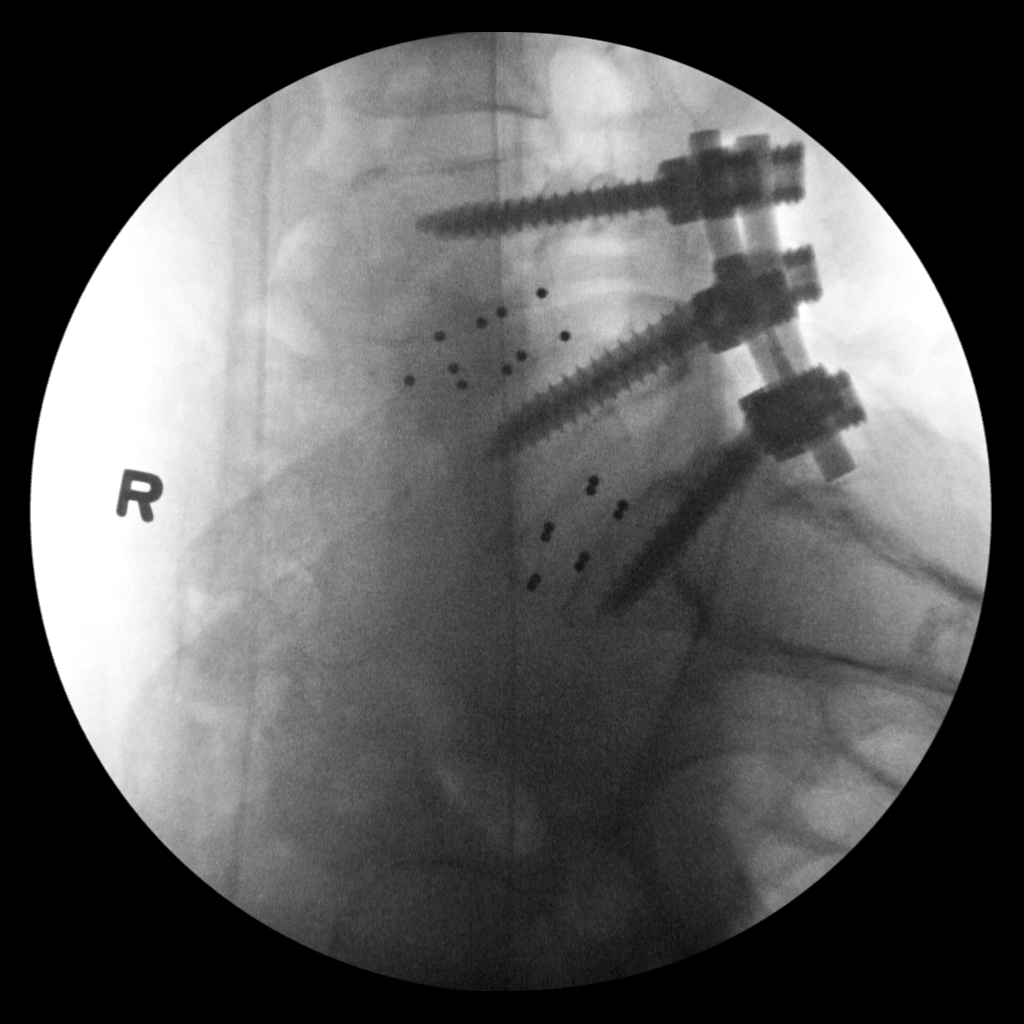
[im 2/2]
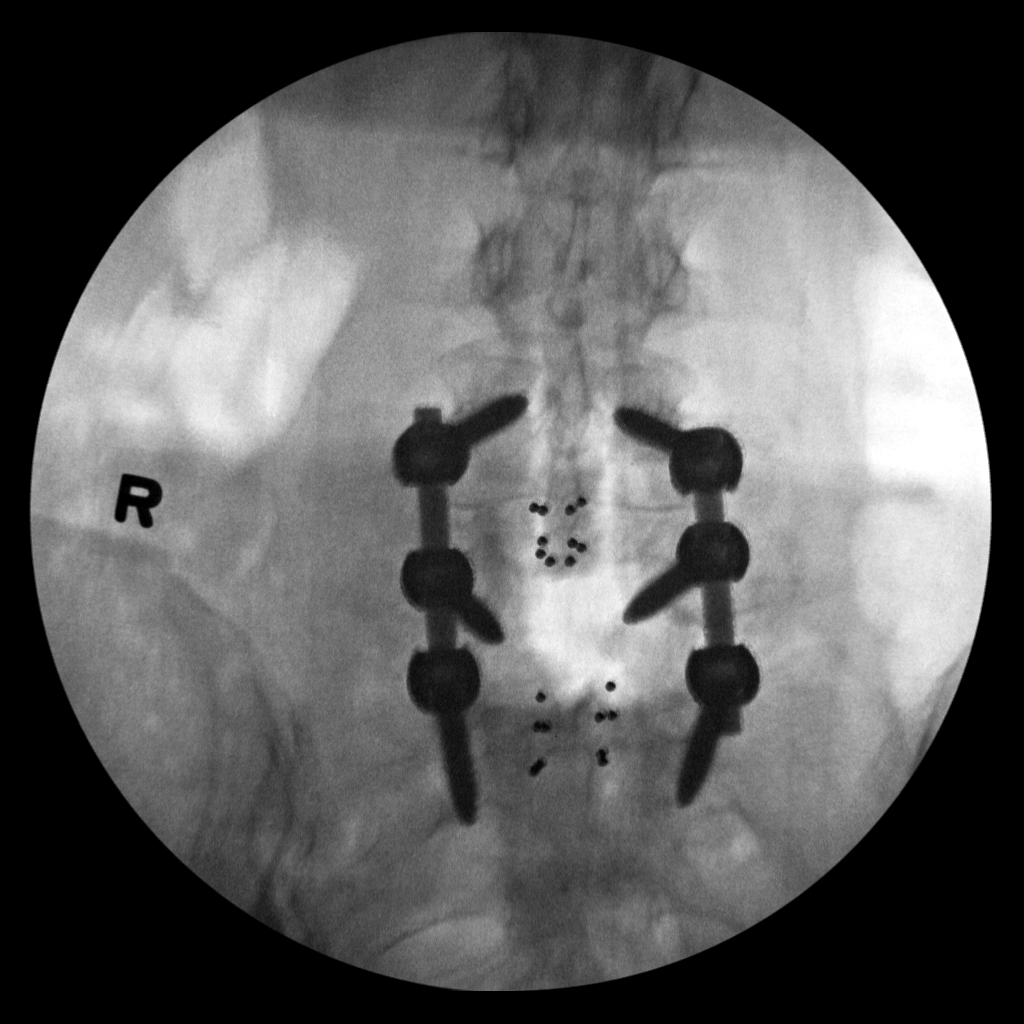

[2 of 2 positions shown; findings below may reference images not displayed]

FINDINGS: Intraoperative imaging demonstrates posterior lumbar fusion hardware
at the L4-5 and L5-S1 levels. Pedicular screws and connecting rods
show normal alignment. Interbody fusion material at the disc space
levels appear well positioned and alignment is grossly anatomic.
IMPRESSION: Anatomic alignment after posterior lumbar interbody fusion at the
L4-5 and L5-S1 levels.

## 2018-05-09 IMAGING — CR DG LUMBAR SPINE 1V
1 series · 1 of 1 positions shown · non-contrast
Comparison: 01/16/2017.  MRI 08/27/2016 .

CLINICAL DATA: Lumbar surgery.

EXAM:
LUMBAR SPINE - 1 VIEW

[lateral]
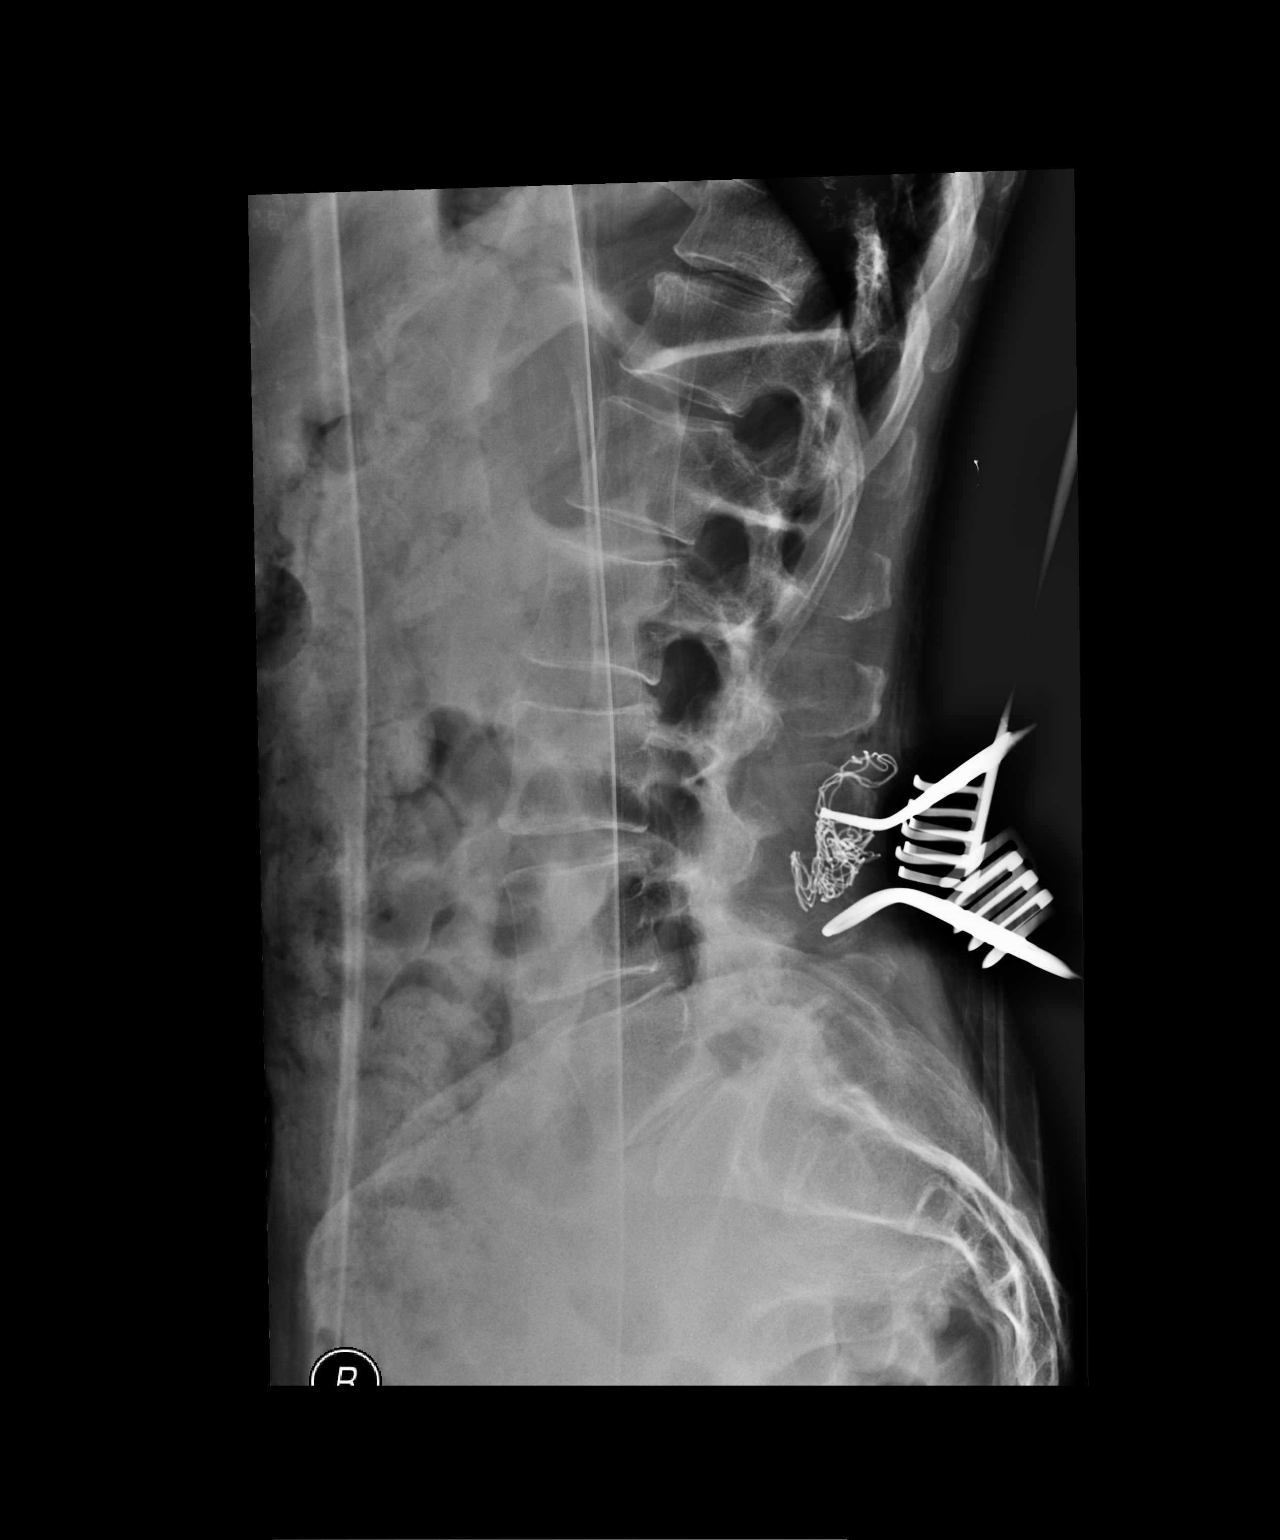

[1 of 1 positions shown; findings below may reference images not displayed]

FINDINGS: Lumbar vertebra numbered as per prior MRI. Metallic markers noted at
the L3-L4 level and L4-L5 level. Surgical sponge noted.
IMPRESSION: Postsurgical changes lumbar spine as above.

## 2018-05-12 NOTE — Telephone Encounter (Signed)
Called patient and LVM stating that PCP did not think increasing vasculera dose would be effective. Nurse added that a referral to vasular surgeon could be ordered if patient felt she wanted to further evaluate her circulation and requested she call nurse back with a response. Nurse's contact number was provided.

## 2018-06-30 NOTE — Progress Notes (Signed)
Office Visit Note  Patient: Heather Ramsey             Date of Birth: 1946/01/24           MRN: [id number]             PCP: Binnie Rail, MD Referring: Binnie Rail, MD Visit Date: 07/14/2018 Occupation: @GUAROCC @  Subjective:  Neck pain   History of Present Illness: Heather Ramsey is a 72 y.o. female with history of fibromyalgia and osteoarthritis.  She continues to have generalized muscle aches muscle tenderness due to fibromyalgia.  She has not been flaring as frequently.  She continues to have chronic fatigue.  She states that she sleeps 3 to 6 hours per night.  She reports that her trochanteric bursitis bilaterally has improved.  She states that she has been having increased neck pain and right-sided radiculopathy.  She states that she has followed up with her PCP.  She states that she does not want to see some surgeon which treated for her lumbar spine surgery.  She denies any sinus time.  She states that she has some stiffness in her hands.  She states that she takes Tylenol at bedtime which helps her sleep through the night better.    Activities of Daily Living:  Patient reports morning stiffness for 3-4 minutes.   Patient Reports nocturnal pain.  Difficulty dressing/grooming: Denies Difficulty climbing stairs: Denies Difficulty getting out of chair: Denies Difficulty using hands for taps, buttons, cutlery, and/or writing: Denies  Review of Systems  Constitutional: Positive for fatigue.  HENT: Positive for mouth dryness. Negative for mouth sores, trouble swallowing, trouble swallowing and nose dryness.   Eyes: Positive for dryness. Negative for pain, redness and visual disturbance.  Respiratory: Negative for cough, hemoptysis, shortness of breath, wheezing and difficulty breathing.   Cardiovascular: Negative for chest pain, palpitations, hypertension and swelling in legs/feet.  Gastrointestinal: Positive for constipation. Negative for abdominal pain, blood in  stool, diarrhea, nausea and vomiting.  Endocrine: Negative for increased urination.  Genitourinary: Negative for painful urination and nocturia.  Musculoskeletal: Positive for arthralgias, joint pain, joint swelling and morning stiffness. Negative for myalgias, muscle weakness, muscle tenderness and myalgias.  Skin: Negative for color change, pallor, rash, hair loss, nodules/bumps, skin tightness, ulcers and sensitivity to sunlight.  Allergic/Immunologic: Negative for susceptible to infections.  Neurological: Negative for dizziness, light-headedness, numbness, headaches, memory loss and weakness.  Hematological: Negative for swollen glands.  Psychiatric/Behavioral: Negative for depressed mood, confusion and sleep disturbance. The patient is not nervous/anxious.     PMFS History:  Patient Active Problem List   Diagnosis Date Noted  . Cardiac murmur 01/07/2018  . History of IBS 01/03/2018  . Osteoporosis 08/18/2017  . Hyperglycemia 07/30/2017  . Deficiency anemia 04/09/2017  . Rash 04/09/2017  . Localized edema 04/09/2017  . Venous stasis dermatitis of both lower extremities 04/09/2017  . Spondylolisthesis at L4-L5 level 02/26/2017  . Fibromyalgia 01/03/2017  . Primary insomnia 01/03/2017  . Primary osteoarthritis of both hands 01/03/2017  . Trochanteric bursitis of both hips 01/03/2017  . Hypertension 12/29/2016  . 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 Lumbar radiculopathy 09/04/2016  . 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 Abnormal EKG 02/05/2012  . Atrophic vaginitis   . Basal cell cancer 04/24/2011  . DIVERTICULOSIS, COLON 06/09/2009  . Hyperlipidemia 05/31/2008  . IRRITABLE BOWEL SYNDROME 05/27/2008  . Myalgia and myositis 05/27/2008  . Osteopenia 05/27/2008  . Chronic fatigue  syndrome 05/27/2008    Past Medical History:  Diagnosis Date  . Allergy   . Atrophic vaginitis   . Chest pain    negative  cardiac  evaluation; Dr Percival Spanish  . Diverticulosis of colon 2005& 2010   FH colon cancer  . Fibromyalgia    Dr Estanislado Pandy  . GERD (gastroesophageal reflux disease)   . Heart murmur   . Hemorrhoid   . Hyperlipidemia   . Hypertension   . IBS (irritable bowel syndrome)   . Migraine   . MVP (mitral valve prolapse)   . Osteopenia   . Pulmonary nodule 07/2006  . UTI (lower urinary tract infection) 2005   Citrobacter koseri, hospitalized w/ kidney infection 1983    Family History  Problem Relation Age of Onset  . Heart attack Father 18       Died with multiple medical problems  . Colon cancer Father 77  . Diabetes Father   . Tuberculosis Paternal Grandmother   . Stroke Mother 28  . Hypothyroidism Mother   . Hypertension Mother   . Hypothyroidism Sister   . Hypertension Sister   . Breast cancer Sister   . Diabetes Sister   . Heart attack Sister        2 sisters > 18   Past Surgical History:  Procedure Laterality Date  . back surgy  02/26/2017   lumbar 4-5  . BREAST BIOPSY Left 2003  . [place]   with bladder tack   . COLONOSCOPY  07/2014   negative; Dr Olevia Perches  . FOOT SURGERY Left 2014   Social History   Social History Narrative   Regular exercise- yes: walks, but not as much due to knee arthritis.  Does stretching   Lives with husband.          Objective: Vital Signs: BP 129/81 (BP Location: Left Arm, Patient Position: Sitting, Cuff Size: Normal)   Pulse 63   Resp 12   Ht 5' 1.5" (1.562 m)   Wt 128 lb 12.8 oz (58.4 kg)   BMI 23.94 kg/m    Physical Exam  Constitutional: She is oriented to person, place, and time. She appears well-developed and well-nourished.  HENT:  Head: Normocephalic and atraumatic.  Eyes: Conjunctivae and EOM are normal.  Neck: Normal range of motion.  Cardiovascular: Normal rate, regular rhythm, normal heart sounds and intact distal pulses.  Pulmonary/Chest: Effort normal and breath sounds normal.  Abdominal: Soft. Bowel sounds  are normal.  Lymphadenopathy:    She has no cervical adenopathy.  Neurological: She is alert and oriented to person, place, and time.  Skin: Skin is warm and dry. Capillary refill takes less than 2 seconds.  Psychiatric: She has a normal mood and affect. Her behavior is normal.  Nursing note and vitals reviewed.    Musculoskeletal Exam: C-spine slightly limited range of motion.  Thoracic and lumbar spine good range of motion.  No midline spinal tenderness.  No SI joint tenderness.  Shoulder joints, elbow joints, wrist joints, MCPs, PIPs and DIPs good range of motion no synovitis.  She has complete fist formation bilaterally.  She has PIP and DIP synovial thickening consistent with osteoarthritis of bilateral hands.  Hip joints, knee joints, ankle joints, MTPs, PIPs, DIPs good range of motion no synovitis.  No warmth or effusion bilateral knee joints.  No tenderness or swelling of ankle joints.  Mild tenderness of bilateral trochanteric bursa.  CDAI Exam: CDAI Score: Not documented Patient Global Assessment: Not documented; Provider  Global Assessment: Not documented Swollen: Not documented; Tender: Not documented Joint Exam   Not documented   There is currently no information documented on the homunculus. Go to the Rheumatology activity and complete the homunculus joint exam.  Investigation: No additional findings.  Imaging: No results found.  Recent Labs: Lab Results  Component Value Date   WBC 5.2 01/07/2018   HGB 13.6 01/07/2018   PLT 303.0 01/07/2018   NA 130 (L) 07/08/2018   K 4.1 07/08/2018   CL 94 (L) 07/08/2018   CO2 28 07/08/2018   GLUCOSE 92 07/08/2018   BUN 15 07/08/2018   CREATININE 0.66 07/08/2018   BILITOT 0.7 07/08/2018   ALKPHOS 47 07/08/2018   AST 19 07/08/2018   ALT 18 07/08/2018   PROT 7.1 07/08/2018   ALBUMIN 4.7 07/08/2018   CALCIUM 10.1 07/08/2018   GFRAA >60 02/27/2017    Speciality Comments: No specialty comments available.  Procedures:  No  procedures performed Allergies: Clarithromycin; Clindamycin; Levofloxacin; Tramadol; Actonel [risedronate sodium]; Atorvastatin; Amlodipine; Bactroban [mupirocin calcium]; and Risedronate sodium   Assessment / Plan:     Visit Diagnoses: Fibromyalgia: She continues to have generalized muscle aches and muscle tenderness.  She has not been flaring as frequently.  She has been having worsening neck pain and stiffness.  She has symptoms of right sided radiculopathy. She continues to have chronic fatigue and insomnia.  She was encouraged to stay active and exercise on a regular basis.   Other fatigue: Chronic and worsening recently.   Primary insomnia: She takes tylenol at bedtime, which helps her sleep better at night.  She has been experiencing neck pain at night. She sleeps 3-6 hours per night.   History of osteopenia: She takes calcium and vitamin D daily.   Primary osteoarthritis of both hands: She has PIP and DIP synovial thickening.  Complete fist formation bilaterally.  She has no synovitis.  She has no joint pain at this time but reports morning stiffness in both hands.  Joint protection and muscle strengthening were discussed.   Trochanteric bursitis of both hips: She has mild tenderness of bilateral trochanteric bursa.    Neck pain: She has been experiencing increased neck pain and stiffness.  She has right sided radiculopathy.  She has been evaluated by her PCP.  She does not want a referral to a neurosurgeon at this time. She was given a handout of neck exercises. Several exercises were demonstrated in the office.   Spondylolisthesis at L4-L5 level: Chronic pain  Spinal stenosis of lumbar region with neurogenic claudication: Chronic pain   Other medical conditions are listed as follows:   Venous stasis dermatitis of both lower extremities  History of diverticulosis  History of basal cell cancer  History of IBS  History of hypertension  History of hyperlipidemia  PVNS  (pigmented villonodular synovitis)   Orders: No orders of the defined types were placed in this encounter.  No orders of the defined types were placed in this encounter.     Follow-Up Instructions: Return in about 6 months (around 01/13/2019) for Fibromyalgia, Osteoarthritis.   Ofilia Neas, PA-C   I examined and evaluated the patient with Hazel Sams PA.  She continues to have some lower back discomfort.  She also have some underlying history of osteoarthritis.  She also has trochanteric bursitis.  She had mild tenderness on my examination.  We have advised her to continue with exercises.  The plan of care was discussed as noted above.  Bo Merino, MD  Note - This record has been created using [employer].  Chart creation errors have been sought, but may not always  have been located. Such creation errors do not reflect on  the standard of medical care.

## 2018-07-06 NOTE — Progress Notes (Signed)
Subjective:    Patient ID: Heather Ramsey, female    DOB: [birth year], 72 y.o.   MRN: [id number]  HPI The patient is here for follow up.  She has had increased stress.  Her sister and best friend died recently.  She is also building a new house and will be moving soon.    Hypertension: She is taking her medication daily. She is compliant with a low sodium diet.  She denies chest pain, palpitations, shortness of breath and regular headaches. She is not exercising regularly.     Hyperlipidemia: She is taking her medication daily. She is compliant with a low fat/cholesterol diet. She is not exercising regularly. She denies myalgias.   Hyperglycemia:  She is compliant with a low sugar/carbohydrate diet.  She is not exercising regularly.   Medications and allergies reviewed with patient and updated if appropriate.  Patient Active Problem List   Diagnosis Date Noted  . Cough 03/17/2018  . Cardiac murmur 01/07/2018  . History of IBS 01/03/2018  . Osteoporosis 08/18/2017  . Hyperglycemia 07/30/2017  . Deficiency anemia 04/09/2017  . Rash 04/09/2017  . Localized edema 04/09/2017  . Venous stasis dermatitis of both lower extremities 04/09/2017  . Spondylolisthesis at L4-L5 level 02/26/2017  . Urinary hesitancy 01/28/2017  . Fibromyalgia 01/03/2017  . Primary insomnia 01/03/2017  . Other fatigue 01/03/2017  . Primary osteoarthritis of both hands 01/03/2017  . Trochanteric bursitis of both hips 01/03/2017  . Hypertension 12/29/2016  . 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 Lumbar radiculopathy 09/04/2016  . 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 Abnormal EKG 02/05/2012  . Atrophic vaginitis   . Basal cell cancer 04/24/2011  . DIVERTICULOSIS, COLON 06/09/2009  . Hyperlipidemia 05/31/2008  . IRRITABLE BOWEL SYNDROME 05/27/2008  . Myalgia and myositis 05/27/2008  . Osteopenia 05/27/2008  . Chronic fatigue syndrome 05/27/2008      Current Outpatient Medications on File Prior to Visit  Medication Sig Dispense Refill  . amLODipine (NORVASC) 2.5 MG tablet Take 1 tablet (2.5 mg total) daily by mouth. 90 tablet 3  . b complex vitamins tablet Take 1 tablet by mouth daily.      . Calcium Carbonate-Vitamin D (CALCIUM + D PO) Take 1 tablet by mouth 2 (two) times daily.     . Dietary Management Product (VASCULERA) TABS Take 1 capsule by mouth daily. 90 tablet 3  . Docosahexaenoic Acid (DHA ALGAL-900 PO) Take 900 mg by mouth daily.    . fish oil-omega-3 fatty acids 1000 MG capsule Take 1-2 g by mouth See admin instructions. 1 cap in the morning, 2 caps in the evening    . Ginger, Zingiber officinalis, (GINGER ROOT) 550 MG CAPS Take 1 capsule by mouth daily.     Marland Kitchen losartan (COZAAR) 100 MG tablet Take 0.5 tablets (50 mg total) by mouth daily. 45 tablet 3  . magnesium 30 MG tablet Take 30 mg by mouth 2 (two) times daily.      . Misc Natural Products (TART CHERRY ADVANCED PO) Take by mouth daily.    . Multiple Vitamins-Minerals (ICAPS AREDS 2 PO) Take 1 capsule by mouth daily.    . Probiotic Product (PROBIOTIC-10 PO) Take by mouth daily.    . Psyllium (METAMUCIL) 28.3 % POWD Take 1 scoop dissolved in at least 8 ounces water/juice and drink twice daily (Patient taking differently: as needed. Take 1 scoop dissolved in at least 8 ounces water/juice and drink twice daily) 1 Bottle  0  . TURMERIC PO Take 400 mg by mouth daily.     No current facility-administered medications on file prior to visit.     Past Medical History:  Diagnosis Date  . Allergy   . Atrophic vaginitis   . Chest pain    negative  cardiac evaluation; Dr Percival Spanish  . Diverticulosis of colon 2005& 2010   FH colon cancer  . Fibromyalgia    Dr Estanislado Pandy  . GERD (gastroesophageal reflux disease)   . Heart murmur   . Hemorrhoid   . Hyperlipidemia   . Hypertension   . IBS (irritable bowel syndrome)   . Migraine   . MVP (mitral valve prolapse)   . Osteopenia    . Pulmonary nodule 07/2006  . UTI (lower urinary tract infection) 2005   Citrobacter koseri, hospitalized w/ kidney infection 1983    Past Surgical History:  Procedure Laterality Date  . back surgy  02/26/2017   lumbar 4-5  . BREAST BIOPSY Left 2003  . [place]   with bladder tack   . COLONOSCOPY  07/2014   negative; Dr Olevia Perches  . FOOT SURGERY Left 2014    Social History   Socioeconomic History  . Marital status: Married    Spouse name: Not on file  . Number of children: 2  . Years of education: Not on file  . Highest education level: Not on file  Occupational History  . Not on file  Social Needs  . Financial resource strain: Not hard at all  . Food insecurity:    Worry: Never true    Inability: Never true  . Transportation needs:    Medical: No    Non-medical: No  Tobacco Use  . Smoking status: Never Smoker  . Smokeless tobacco: Never Used  Substance and Sexual Activity  . Alcohol use: No  . Drug use: No  . Sexual activity: Yes    Birth control/protection: Post-menopausal  Lifestyle  . Physical activity:    Days per week: 4 days    Minutes per session: 40 min  . Stress: To some extent  Relationships  . Social connections:    Talks on phone: More than three times a week    Gets together: More than three times a week    Attends religious service: Never    Active member of club or organization: Yes    Attends meetings of clubs or organizations: More than 4 times per year    Relationship status: Married  Other Topics Concern  . Not on file  Social History Narrative   Regular exercise- yes: walks, but not as much due to knee arthritis.  Does stretching   Lives with husband.          Family History  Problem Relation Age of Onset  . Heart attack Father 81       Died with multiple medical problems  . Colon cancer Father 8  . Diabetes Father   . Tuberculosis Paternal Grandmother   . Stroke Mother 28  . Hypothyroidism Mother   .  Hypertension Mother   . Hypothyroidism Sister   . Hypertension Sister   . Breast cancer Sister   . Diabetes Sister   . Heart attack Sister        2 sisters > 80    Review of Systems  Constitutional: Negative for chills and fever.  Respiratory: Negative for cough, shortness of breath and wheezing.   Cardiovascular: Positive for leg swelling (minimal at  end of day). Negative for chest pain and palpitations.  Neurological: Positive for headaches (occasionally). Negative for dizziness and light-headedness.       Objective:   Vitals:   07/08/18 0830  BP: 134/88  Pulse: 76  Resp: 16  Temp: 98.6 F (37 C)  SpO2: 98%   BP Readings from Last 3 Encounters:  07/08/18 134/88  05/07/18 118/62  03/17/18 134/88   Wt Readings from Last 3 Encounters:  07/08/18 124 lb (56.2 kg)  05/07/18 133 lb (60.3 kg)  03/17/18 133 lb (60.3 kg)   Body mass index is 22.68 kg/m.   Physical Exam    Constitutional: Appears well-developed and well-nourished. No distress.  HENT:  Head: Normocephalic and atraumatic.  Neck: Neck supple. No tracheal deviation present. No thyromegaly present.  No cervical lymphadenopathy Cardiovascular: Normal rate, regular rhythm and normal heart sounds.   2/6 systolic murmur heard. No carotid bruit .  No edema Pulmonary/Chest: Effort normal and breath sounds normal. No respiratory distress. No has no wheezes. No rales.  Skin: Skin is warm and dry. Not diaphoretic.  Psychiatric: Normal mood and affect. Behavior is normal.      Assessment & Plan:    See Problem List for Assessment and Plan of chronic medical problems.

## 2018-07-08 ENCOUNTER — Encounter: Payer: Self-pay | Admitting: Internal Medicine

## 2018-07-08 ENCOUNTER — Other Ambulatory Visit (INDEPENDENT_AMBULATORY_CARE_PROVIDER_SITE_OTHER): Payer: PPO

## 2018-07-08 ENCOUNTER — Ambulatory Visit (INDEPENDENT_AMBULATORY_CARE_PROVIDER_SITE_OTHER): Payer: PPO | Admitting: Internal Medicine

## 2018-07-08 VITALS — BP 134/88 | HR 76 | Temp 98.6°F | Resp 16 | Ht 62.0 in | Wt 124.0 lb

## 2018-07-08 DIAGNOSIS — R739 Hyperglycemia, unspecified: Secondary | ICD-10-CM | POA: Diagnosis not present

## 2018-07-08 DIAGNOSIS — I10 Essential (primary) hypertension: Secondary | ICD-10-CM | POA: Diagnosis not present

## 2018-07-08 DIAGNOSIS — E7849 Other hyperlipidemia: Secondary | ICD-10-CM | POA: Diagnosis not present

## 2018-07-08 LAB — LIPID PANEL
CHOL/HDL RATIO: 3
CHOLESTEROL: 266 mg/dL — AB (ref 0–200)
HDL: 84.4 mg/dL (ref >39.00)
LDL Cholesterol: 167 mg/dL — ABNORMAL HIGH (ref 0–99)
NonHDL: 181.38
TRIGLYCERIDES: 70 mg/dL (ref 0.0–149.0)
VLDL: 14 mg/dL (ref 0.0–40.0)

## 2018-07-08 LAB — COMPREHENSIVE METABOLIC PANEL
ALBUMIN: 4.7 g/dL (ref 3.5–5.2)
ALK PHOS: 47 U/L (ref 39–117)
ALT: 18 U/L (ref 0–35)
AST: 19 U/L (ref 0–37)
BILIRUBIN TOTAL: 0.7 mg/dL (ref 0.2–1.2)
BUN: 15 mg/dL (ref 6–23)
CALCIUM: 10.1 mg/dL (ref 8.4–10.5)
CO2: 28 meq/L (ref 19–32)
CREATININE: 0.66 mg/dL (ref 0.40–1.20)
Chloride: 94 mEq/L — ABNORMAL LOW (ref 96–112)
GFR: 93.63 mL/min (ref >60.00)
Glucose, Bld: 92 mg/dL (ref 70–99)
Potassium: 4.1 mEq/L (ref 3.5–5.1)
Sodium: 130 mEq/L — ABNORMAL LOW (ref 135–145)
TOTAL PROTEIN: 7.1 g/dL (ref 6.0–8.3)

## 2018-07-08 LAB — HEMOGLOBIN A1C: Hgb A1c MFr Bld: 5.1 % (ref 4.6–6.5)

## 2018-07-08 NOTE — Patient Instructions (Addendum)
  Tests ordered today. Your results will be released to MyChart (or called to you) after review, usually within 72hours after test completion. If any changes need to be made, you will be notified at that same time.  Medications reviewed and updated.  Changes include :   none      Please followup in 6 months   

## 2018-07-08 NOTE — Assessment & Plan Note (Signed)
Check lipid panel  Does not want to take any medication - would rather take her chances with the high cholesterol Regular exercise and healthy diet encouraged

## 2018-07-08 NOTE — Assessment & Plan Note (Signed)
Check a1c Low sugar / carb diet Stressed regular exercise   

## 2018-07-08 NOTE — Assessment & Plan Note (Addendum)
BP controlled Current regimen effective and well tolerated Continue current medications at current doses cmp 

## 2018-07-14 ENCOUNTER — Ambulatory Visit: Payer: PPO | Admitting: Rheumatology

## 2018-07-14 ENCOUNTER — Encounter: Payer: Self-pay | Admitting: Rheumatology

## 2018-07-14 VITALS — BP 129/81 | HR 63 | Resp 12 | Ht 61.5 in | Wt 128.8 lb

## 2018-07-14 DIAGNOSIS — M542 Cervicalgia: Secondary | ICD-10-CM | POA: Diagnosis not present

## 2018-07-14 DIAGNOSIS — M7062 Trochanteric bursitis, left hip: Secondary | ICD-10-CM

## 2018-07-14 DIAGNOSIS — Z8739 Personal history of other diseases of the musculoskeletal system and connective tissue: Secondary | ICD-10-CM | POA: Diagnosis not present

## 2018-07-14 DIAGNOSIS — R5383 Other fatigue: Secondary | ICD-10-CM | POA: Diagnosis not present

## 2018-07-14 DIAGNOSIS — M797 Fibromyalgia: Secondary | ICD-10-CM | POA: Diagnosis not present

## 2018-07-14 DIAGNOSIS — Z8639 Personal history of other endocrine, nutritional and metabolic disease: Secondary | ICD-10-CM

## 2018-07-14 DIAGNOSIS — I872 Venous insufficiency (chronic) (peripheral): Secondary | ICD-10-CM | POA: Diagnosis not present

## 2018-07-14 DIAGNOSIS — M48062 Spinal stenosis, lumbar region with neurogenic claudication: Secondary | ICD-10-CM | POA: Diagnosis not present

## 2018-07-14 DIAGNOSIS — M1220 Villonodular synovitis (pigmented), unspecified site: Secondary | ICD-10-CM

## 2018-07-14 DIAGNOSIS — M19041 Primary osteoarthritis, right hand: Secondary | ICD-10-CM | POA: Diagnosis not present

## 2018-07-14 DIAGNOSIS — Z8719 Personal history of other diseases of the digestive system: Secondary | ICD-10-CM | POA: Diagnosis not present

## 2018-07-14 DIAGNOSIS — Z85828 Personal history of other malignant neoplasm of skin: Secondary | ICD-10-CM

## 2018-07-14 DIAGNOSIS — M7061 Trochanteric bursitis, right hip: Secondary | ICD-10-CM | POA: Diagnosis not present

## 2018-07-14 DIAGNOSIS — F5101 Primary insomnia: Secondary | ICD-10-CM

## 2018-07-14 DIAGNOSIS — Z8679 Personal history of other diseases of the circulatory system: Secondary | ICD-10-CM

## 2018-07-14 DIAGNOSIS — M4316 Spondylolisthesis, lumbar region: Secondary | ICD-10-CM | POA: Diagnosis not present

## 2018-07-14 DIAGNOSIS — M19042 Primary osteoarthritis, left hand: Secondary | ICD-10-CM

## 2018-07-14 NOTE — Patient Instructions (Signed)
Neck Exercises Neck exercises can be important for many reasons:  They can help you to improve and maintain flexibility in your neck. This can be especially important as you age.  They can help to make your neck stronger. This can make movement easier.  They can reduce or prevent neck pain.  They may help your upper back.  Ask your health care provider which neck exercises would be best for you. Exercises Neck Press Repeat this exercise 10 times. Do it first thing in the morning and right before bed or as told by your health care provider. 1. Lie on your back on a firm bed or on the floor with a pillow under your head. 2. Use your neck muscles to push your head down on the pillow and straighten your spine. 3. Hold the position as well as you can. Keep your head facing up and your chin tucked. 4. Slowly count to 5 while holding this position. 5. Relax for a few seconds. Then repeat.  Isometric Strengthening Do a full set of these exercises 2 times a day or as told by your health care provider. 1. Sit in a supportive chair and place your hand on your forehead. 2. Push forward with your head and neck while pushing back with your hand. Hold for 10 seconds. 3. Relax. Then repeat the exercise 3 times. 4. Next, do thesequence again, this time putting your hand against the back of your head. Use your head and neck to push backward against the hand pressure. 5. Finally, do the same exercise on either side of your head, pushing sideways against the pressure of your hand.  Prone Head Lifts Repeat this exercise 5 times. Do this 2 times a day or as told by your health care provider. 1. Lie face-down, resting on your elbows so that your chest and upper back are raised. 2. Start with your head facing downward, near your chest. Position your chin either on or near your chest. 3. Slowly lift your head upward. Lift until you are looking straight ahead. Then continue lifting your head as far back as  you can stretch. 4. Hold your head up for 5 seconds. Then slowly lower it to your starting position.  Supine Head Lifts Repeat this exercise 8-10 times. Do this 2 times a day or as told by your health care provider. 1. Lie on your back, bending your knees to point to the ceiling and keeping your feet flat on the floor. 2. Lift your head slowly off the floor, raising your chin toward your chest. 3. Hold for 5 seconds. 4. Relax and repeat.  Scapular Retraction Repeat this exercise 5 times. Do this 2 times a day or as told by your health care provider. 1. Stand with your arms at your sides. Look straight ahead. 2. Slowly pull both shoulders backward and downward until you feel a stretch between your shoulder blades in your upper back. 3. Hold for 10-30 seconds. 4. Relax and repeat.  Contact a health care provider if:  Your neck pain or discomfort gets much worse when you do an exercise.  Your neck pain or discomfort does not improve within 2 hours after you exercise. If you have any of these problems, stop exercising right away. Do not do the exercises again unless your health care provider says that you can. Get help right away if:  You develop sudden, severe neck pain. If this happens, stop exercising right away. Do not do the exercises again unless your   health care provider says that you can. Exercises Neck Stretch  Repeat this exercise 3-5 times. 1. Do this exercise while standing or while sitting in a chair. 2. Place your feet flat on the floor, shoulder-width apart. 3. Slowly turn your head to the right. Turn it all the way to the right so you can look over your right shoulder. Do not tilt or tip your head. 4. Hold this position for 10-30 seconds. 5. Slowly turn your head to the left, to look over your left shoulder. 6. Hold this position for 10-30 seconds.  Neck Retraction Repeat this exercise 8-10 times. Do this 3-4 times a day or as told by your health care  provider. 1. Do this exercise while standing or while sitting in a sturdy chair. 2. Look straight ahead. Do not bend your neck. 3. Use your fingers to push your chin backward. Do not bend your neck for this movement. Continue to face straight ahead. If you are doing the exercise properly, you will feel a slight sensation in your throat and a stretch at the back of your neck. 4. Hold the stretch for 1-2 seconds. Relax and repeat.  This information is not intended to replace advice given to you by your health care provider. Make sure you discuss any questions you have with your health care provider. Document Released: 08/22/2015 Document Revised: 02/16/2016 Document Reviewed: 03/21/2015 Elsevier Interactive Patient Education  2018 [employer]  

## 2018-07-29 ENCOUNTER — Other Ambulatory Visit: Payer: Self-pay | Admitting: Internal Medicine

## 2018-08-05 DIAGNOSIS — Z1231 Encounter for screening mammogram for malignant neoplasm of breast: Secondary | ICD-10-CM | POA: Diagnosis not present

## 2018-08-05 LAB — HM MAMMOGRAPHY

## 2018-08-13 ENCOUNTER — Encounter: Payer: Self-pay | Admitting: Internal Medicine

## 2018-10-29 DIAGNOSIS — K1329 Other disturbances of oral epithelium, including tongue: Secondary | ICD-10-CM | POA: Diagnosis not present

## 2018-12-01 ENCOUNTER — Other Ambulatory Visit: Payer: Self-pay | Admitting: Internal Medicine

## 2019-01-09 ENCOUNTER — Encounter: Payer: PPO | Admitting: Internal Medicine

## 2019-01-13 ENCOUNTER — Ambulatory Visit: Payer: PPO | Admitting: Physician Assistant

## 2019-01-21 ENCOUNTER — Other Ambulatory Visit: Payer: Self-pay | Admitting: Internal Medicine

## 2019-01-21 DIAGNOSIS — I872 Venous insufficiency (chronic) (peripheral): Secondary | ICD-10-CM

## 2019-01-22 ENCOUNTER — Other Ambulatory Visit: Payer: Self-pay | Admitting: Emergency Medicine

## 2019-01-22 DIAGNOSIS — I872 Venous insufficiency (chronic) (peripheral): Secondary | ICD-10-CM

## 2019-01-22 MED ORDER — VASCULERA PO TABS: 1.0000 | ORAL_TABLET | Freq: Every day | ORAL | 0 refills | Status: DC

## 2019-01-25 ENCOUNTER — Other Ambulatory Visit: Payer: Self-pay | Admitting: Internal Medicine

## 2019-02-04 NOTE — Progress Notes (Signed)
Virtual Visit via Video Note  I connected with Heather Ramsey on 02/05/19 at  8:30 AM EDT by a video enabled telemedicine application and verified that I am speaking with the correct person using two identifiers.   I discussed the limitations of evaluation and management by telemedicine and the availability of in person appointments. The patient expressed understanding and agreed to proceed.  The patient is currently at home and I am in the office.    No referring provider.    History of Present Illness: She is here for follow up of her chronic medical conditions.    She is exercising - walking some.    Hypertension:  She is taking her medications as prescribed.  She is compliant with a low sodium diet.  She denies chest pain, palpitations, edema, shortness of breath and regular headaches.   She does not monitor her BP at home.  Hyperlipidemia: She is not currently on any medication and does not want to take any.  She is compliant with a low fat/cholesterol diet.   Hyperglycemia: She is compliant with a low sugar/carbohydrate diet.  She is exercising regularly.   Review of Systems  Constitutional: Negative for chills and fever.  Respiratory: Negative for cough, shortness of breath and wheezing.   Cardiovascular: Positive for palpitations (occ, more when laying on left side). Negative for chest pain and leg swelling.  Neurological: Positive for dizziness (one episode recently, occ feels lightheaded with looking down). Negative for headaches.     Social History   Socioeconomic History  . Marital status: Married    Spouse name: Not on file  . Number of children: 2  . Years of education: Not on file  . Highest education level: Not on file  Occupational History  . Not on file  Social Needs  . Financial resource strain: Not hard at all  . Food insecurity:    Worry: Never true    Inability: Never true  . Transportation needs:    Medical: No    Non-medical: No  Tobacco  Use  . Smoking status: Never Smoker  . Smokeless tobacco: Never Used  Substance and Sexual Activity  . Alcohol use: No  . Drug use: No  . Sexual activity: Yes    Birth control/protection: Post-menopausal  Lifestyle  . Physical activity:    Days per week: 4 days    Minutes per session: 40 min  . Stress: To some extent  Relationships  . Social connections:    Talks on phone: More than three times a week    Gets together: More than three times a week    Attends religious service: Never    Active member of club or organization: Yes    Attends meetings of clubs or organizations: More than 4 times per year    Relationship status: Married  Other Topics Concern  . Not on file  Social History Narrative   Regular exercise- yes: walks, but not as much due to knee arthritis.  Does stretching   Lives with husband.           Observations/Objective: Appears well in NAD  Sugar yesterday 100 at home Does not check her BP  Assessment and Plan:  See Problem List for Assessment and Plan of chronic medical problems.   Follow Up Instructions:    I discussed the assessment and treatment plan with the patient. The patient was provided an opportunity to ask questions and all were answered. The patient agreed with the  plan and demonstrated an understanding of the instructions.   The patient was advised to call back or seek an in-person evaluation if the symptoms worsen or if the condition fails to improve as anticipated.  FU next month for a CPE  Binnie Rail, MD

## 2019-02-05 ENCOUNTER — Ambulatory Visit (INDEPENDENT_AMBULATORY_CARE_PROVIDER_SITE_OTHER): Payer: PPO | Admitting: Internal Medicine

## 2019-02-05 ENCOUNTER — Encounter: Payer: Self-pay | Admitting: Internal Medicine

## 2019-02-05 DIAGNOSIS — E7849 Other hyperlipidemia: Secondary | ICD-10-CM | POA: Diagnosis not present

## 2019-02-05 DIAGNOSIS — I10 Essential (primary) hypertension: Secondary | ICD-10-CM | POA: Diagnosis not present

## 2019-02-05 DIAGNOSIS — R739 Hyperglycemia, unspecified: Secondary | ICD-10-CM

## 2019-02-05 NOTE — Assessment & Plan Note (Signed)
Has been eating more since being at home more and has gained weight Walking some Check a1c next month at CPE Low sugar / carb diet Stressed regular exercise

## 2019-02-05 NOTE — Assessment & Plan Note (Signed)
Prefers not to take medication and understands the risk of untreated high cholesterol  Check lipids, cmp next month Continue regular exercise Health diet

## 2019-02-05 NOTE — Assessment & Plan Note (Signed)
BP Readings from Last 3 Encounters:  07/14/18 129/81  07/08/18 134/88  05/07/18 118/62    BP well controlled Current regimen effective and well tolerated Continue current medications at current doses

## 2019-02-11 ENCOUNTER — Telehealth: Payer: Self-pay | Admitting: Internal Medicine

## 2019-02-11 DIAGNOSIS — M810 Age-related osteoporosis without current pathological fracture: Secondary | ICD-10-CM

## 2019-02-11 DIAGNOSIS — R8290 Unspecified abnormal findings in urine: Secondary | ICD-10-CM

## 2019-02-11 DIAGNOSIS — E7849 Other hyperlipidemia: Secondary | ICD-10-CM

## 2019-02-11 DIAGNOSIS — I10 Essential (primary) hypertension: Secondary | ICD-10-CM

## 2019-02-11 DIAGNOSIS — R739 Hyperglycemia, unspecified: Secondary | ICD-10-CM

## 2019-02-11 NOTE — Addendum Note (Signed)
Addended by: Binnie Rail on: 02/11/2019 04:38 PM   Modules accepted: Orders

## 2019-02-11 NOTE — Telephone Encounter (Signed)
Blood work and urine ordered-she can get it when she is able.

## 2019-02-11 NOTE — Telephone Encounter (Signed)
Pt just had a appt with Dr. Quay Burow and would like blood work ordered that she states was discussed. She would like UA ordered as well due to her Urine having an odor and being dark. No irritation or burning when urinating. Please advise.

## 2019-02-12 NOTE — Telephone Encounter (Signed)
Notified pt w/MD response.../lmb 

## 2019-02-13 ENCOUNTER — Other Ambulatory Visit (INDEPENDENT_AMBULATORY_CARE_PROVIDER_SITE_OTHER): Payer: PPO

## 2019-02-13 ENCOUNTER — Other Ambulatory Visit: Payer: Self-pay | Admitting: Internal Medicine

## 2019-02-13 DIAGNOSIS — I10 Essential (primary) hypertension: Secondary | ICD-10-CM

## 2019-02-13 DIAGNOSIS — R739 Hyperglycemia, unspecified: Secondary | ICD-10-CM | POA: Diagnosis not present

## 2019-02-13 DIAGNOSIS — E7849 Other hyperlipidemia: Secondary | ICD-10-CM | POA: Diagnosis not present

## 2019-02-13 DIAGNOSIS — R8290 Unspecified abnormal findings in urine: Secondary | ICD-10-CM | POA: Diagnosis not present

## 2019-02-13 DIAGNOSIS — I872 Venous insufficiency (chronic) (peripheral): Secondary | ICD-10-CM

## 2019-02-13 DIAGNOSIS — M810 Age-related osteoporosis without current pathological fracture: Secondary | ICD-10-CM | POA: Diagnosis not present

## 2019-02-13 LAB — LIPID PANEL
Cholesterol: 261 mg/dL — ABNORMAL HIGH (ref 0–200)
HDL: 89.8 mg/dL (ref >39.00)
LDL Cholesterol: 156 mg/dL — ABNORMAL HIGH (ref 0–99)
NonHDL: 171.06
Total CHOL/HDL Ratio: 3
Triglycerides: 75 mg/dL (ref 0.0–149.0)
VLDL: 15 mg/dL (ref 0.0–40.0)

## 2019-02-13 LAB — URINALYSIS, ROUTINE W REFLEX MICROSCOPIC
Bilirubin Urine: NEGATIVE
Hgb urine dipstick: NEGATIVE
Ketones, ur: NEGATIVE
Nitrite: POSITIVE — AB
RBC / HPF: NONE SEEN (ref 0–?)
Specific Gravity, Urine: 1.01 (ref 1.000–1.030)
Total Protein, Urine: NEGATIVE
Urine Glucose: NEGATIVE
Urobilinogen, UA: 0.2 (ref 0.0–1.0)
pH: 7.5 (ref 5.0–8.0)

## 2019-02-13 LAB — COMPREHENSIVE METABOLIC PANEL
ALT: 33 U/L (ref 0–35)
AST: 28 U/L (ref 0–37)
Albumin: 4.4 g/dL (ref 3.5–5.2)
Alkaline Phosphatase: 50 U/L (ref 39–117)
BUN: 16 mg/dL (ref 6–23)
CO2: 28 mEq/L (ref 19–32)
Calcium: 9.4 mg/dL (ref 8.4–10.5)
Chloride: 99 mEq/L (ref 96–112)
Creatinine, Ser: 0.78 mg/dL (ref 0.40–1.20)
GFR: 72.52 mL/min (ref >60.00)
Glucose, Bld: 92 mg/dL (ref 70–99)
Potassium: 4 mEq/L (ref 3.5–5.1)
Sodium: 136 mEq/L (ref 135–145)
Total Bilirubin: 0.5 mg/dL (ref 0.2–1.2)
Total Protein: 6.9 g/dL (ref 6.0–8.3)

## 2019-02-13 LAB — CBC WITH DIFFERENTIAL/PLATELET
Basophils Absolute: 0 10*3/uL (ref 0.0–0.1)
Basophils Relative: 1 % (ref 0.0–3.0)
Eosinophils Absolute: 0 10*3/uL (ref 0.0–0.7)
Eosinophils Relative: 0.9 % (ref 0.0–5.0)
HCT: 40.2 % (ref 36.0–46.0)
Hemoglobin: 13.8 g/dL (ref 12.0–15.0)
Lymphocytes Relative: 27.8 % (ref 12.0–46.0)
Lymphs Abs: 1.4 10*3/uL (ref 0.7–4.0)
MCHC: 34.2 g/dL (ref 30.0–36.0)
MCV: 90.1 fl (ref 78.0–100.0)
Monocytes Absolute: 0.4 10*3/uL (ref 0.1–1.0)
Monocytes Relative: 7.6 % (ref 3.0–12.0)
Neutro Abs: 3.2 10*3/uL (ref 1.4–7.7)
Neutrophils Relative %: 62.7 % (ref 43.0–77.0)
Platelets: 213 10*3/uL (ref 150.0–400.0)
RBC: 4.46 Mil/uL (ref 3.87–5.11)
RDW: 13.5 % (ref 11.5–15.5)
WBC: 5.2 10*3/uL (ref 4.0–10.5)

## 2019-02-13 LAB — TSH: TSH: 4.57 u[IU]/mL — ABNORMAL HIGH (ref 0.35–4.50)

## 2019-02-13 LAB — HEMOGLOBIN A1C: Hgb A1c MFr Bld: 5.7 % (ref 4.6–6.5)

## 2019-02-13 LAB — VITAMIN D 25 HYDROXY (VIT D DEFICIENCY, FRACTURES): VITD: 37.57 ng/mL (ref 30.00–100.00)

## 2019-02-13 MED ORDER — AMOXICILLIN-POT CLAVULANATE 875-125 MG PO TABS: 1.0000 | ORAL_TABLET | Freq: Two times a day (BID) | ORAL | 0 refills | Status: DC

## 2019-02-13 NOTE — Telephone Encounter (Signed)
LVM for pt to call back for results.

## 2019-02-13 NOTE — Telephone Encounter (Signed)
Her urine shows a probable infection  -the culture will be not be back for a couple of days, but because it is a long weekend I think we should start antibiotics now.  We can let her know the culture results when they are back.  Blood start Augmentin twice daily for 1 week-pending.

## 2019-02-16 ENCOUNTER — Other Ambulatory Visit: Payer: Self-pay | Admitting: Internal Medicine

## 2019-02-16 ENCOUNTER — Encounter: Payer: Self-pay | Admitting: Internal Medicine

## 2019-02-16 LAB — URINE CULTURE
MICRO NUMBER:: 500181
SPECIMEN QUALITY:: ADEQUATE

## 2019-03-02 ENCOUNTER — Other Ambulatory Visit: Payer: Self-pay | Admitting: Internal Medicine

## 2019-03-04 ENCOUNTER — Encounter: Payer: Self-pay | Admitting: Internal Medicine

## 2019-03-09 NOTE — Progress Notes (Signed)
Subjective:    Patient ID: Heather Ramsey, female    DOB: 1945-09-28, 73 y.o.   MRN: [id number]  HPI She is here for a physical exam.   She denies any significant changes in her health and has no concerns.   Medications and allergies reviewed with patient and updated if appropriate.  Patient Active Problem List   Diagnosis Date Noted  . Cardiac murmur 01/07/2018  . History of IBS 01/03/2018  . Osteoporosis 08/18/2017  . Hyperglycemia 07/30/2017  . Deficiency anemia 04/09/2017  . Localized edema 04/09/2017  . Venous stasis dermatitis of both lower extremities 04/09/2017  . Spondylolisthesis at L4-L5 level 02/26/2017  . Fibromyalgia 01/03/2017  . Primary insomnia 01/03/2017  . Primary osteoarthritis of both hands 01/03/2017  . Trochanteric bursitis of both hips 01/03/2017  . Hypertension 12/29/2016  . 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 Lumbar radiculopathy 09/04/2016  . 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 Abnormal EKG 02/05/2012  . Atrophic vaginitis   . Basal cell cancer 04/24/2011  . DIVERTICULOSIS, COLON 06/09/2009  . Hyperlipidemia 05/31/2008  . IRRITABLE BOWEL SYNDROME 05/27/2008  . Myalgia and myositis 05/27/2008  . Chronic fatigue syndrome 05/27/2008    Current Outpatient Medications on File Prior to Visit  Medication Sig Dispense Refill  . b complex vitamins tablet Take 1 tablet by mouth daily.      . Calcium Carbonate-Vitamin D (CALCIUM + D PO) Take 1 tablet by mouth 2 (two) times daily.     . Dietary Management Product (VASCULERA) TABS TAKE ONE TABLET BY MOUTH EVERY DAY 90 tablet 1  . Docosahexaenoic Acid (DHA ALGAL-900 PO) Take 900 mg by mouth daily.    . fish oil-omega-3 fatty acids 1000 MG capsule Take 1-2 g by mouth See admin instructions. 1 cap in the morning, 2 caps in the evening    . Ginger, Zingiber officinalis, (GINGER ROOT) 550 MG CAPS Take 1 capsule by mouth daily.     . magnesium 30  MG tablet Take 30 mg by mouth 2 (two) times daily.      . Misc Natural Products (TART CHERRY ADVANCED PO) Take by mouth daily.    . Multiple Vitamins-Minerals (ICAPS AREDS 2 PO) Take 1 capsule by mouth daily.    . Probiotic Product (PROBIOTIC-10 PO) Take by mouth daily.    . Psyllium (METAMUCIL) 28.3 % POWD Take 1 scoop dissolved in at least 8 ounces water/juice and drink twice daily (Patient taking differently: as needed. Take 1 scoop dissolved in at least 8 ounces water/juice and drink twice daily) 1 Bottle 0  . TURMERIC PO Take 400 mg by mouth daily.     No current facility-administered medications on file prior to visit.     Past Medical History:  Diagnosis Date  . Allergy   . Atrophic vaginitis   . Chest pain    negative  cardiac evaluation; Dr Percival Spanish  . Diverticulosis of colon 2005& 2010   FH colon cancer  . Fibromyalgia    Dr Estanislado Pandy  . GERD (gastroesophageal reflux disease)   . Heart murmur   . Hemorrhoid   . Hyperlipidemia   . Hypertension   . IBS (irritable bowel syndrome)   . Migraine   . MVP (mitral valve prolapse)   . Osteopenia   . Pulmonary nodule 07/2006  . UTI (lower urinary tract infection) 2005   Citrobacter koseri, hospitalized w/ kidney infection 1983    Past Surgical History:  Procedure Laterality  Date  . back surgy  02/26/2017   lumbar 4-5  . BREAST BIOPSY Left 2003  . [place]   with bladder tack   . COLONOSCOPY  07/2014   negative; Dr Olevia Perches  . FOOT SURGERY Left 2014    Social History   Socioeconomic History  . Marital status: Married    Spouse name: Not on file  . Number of children: 2  . Years of education: Not on file  . Highest education level: Not on file  Occupational History  . Not on file  Social Needs  . Financial resource strain: Not hard at all  . Food insecurity    Worry: Never true    Inability: Never true  . Transportation needs    Medical: No    Non-medical: No  Tobacco Use  . Smoking status:  Never Smoker  . Smokeless tobacco: Never Used  Substance and Sexual Activity  . Alcohol use: No  . Drug use: No  . Sexual activity: Yes    Birth control/protection: Post-menopausal  Lifestyle  . Physical activity    Days per week: 4 days    Minutes per session: 40 min  . Stress: To some extent  Relationships  . Social connections    Talks on phone: More than three times a week    Gets together: More than three times a week    Attends religious service: Never    Active member of club or organization: Yes    Attends meetings of clubs or organizations: More than 4 times per year    Relationship status: Married  Other Topics Concern  . Not on file  Social History Narrative   Regular exercise- yes: walks, but not as much due to knee arthritis.  Does stretching   Lives with husband.          Family History  Problem Relation Age of Onset  . Heart attack Father 54       Died with multiple medical problems  . Colon cancer Father 78  . Diabetes Father   . Tuberculosis Paternal Grandmother   . Stroke Mother 77  . Hypothyroidism Mother   . Hypertension Mother   . Hypothyroidism Sister   . Hypertension Sister   . Breast cancer Sister   . Diabetes Sister   . Heart attack Sister        2 sisters > 57    Review of Systems  Constitutional: Negative for chills and fever.  Eyes: Negative for visual disturbance.  Respiratory: Negative for cough, shortness of breath and wheezing.   Cardiovascular: Positive for palpitations (rare) and leg swelling (mild in ankles). Negative for chest pain.  Gastrointestinal: Positive for constipation. Negative for abdominal pain, blood in stool, diarrhea and nausea.  Genitourinary: Negative for dysuria and hematuria.  Musculoskeletal: Positive for back pain.  Skin: Negative for color change and rash.  Neurological: Negative for light-headedness and headaches.  Psychiatric/Behavioral: Negative for dysphoric mood. The patient is not nervous/anxious.         Objective:   Vitals:   03/10/19 1425  BP: 136/84  Pulse: 70  Resp: 16  Temp: 98.1 F (36.7 C)  SpO2: 98%   Filed Weights   03/10/19 1425  Weight: 137 lb (62.1 kg)   Body mass index is 25.47 kg/m.  BP Readings from Last 3 Encounters:  03/10/19 136/84  07/14/18 129/81  07/08/18 134/88    Wt Readings from Last 3 Encounters:  03/10/19 137 lb (62.1  kg)  07/14/18 128 lb 12.8 oz (58.4 kg)  07/08/18 124 lb (56.2 kg)     Physical Exam Constitutional: She appears well-developed and well-nourished. No distress.  HENT:  Head: Normocephalic and atraumatic.  Right Ear: External ear normal. Normal ear canal and TM Left Ear: External ear normal.  Normal ear canal and TM Mouth/Throat: Oropharynx is clear and moist.  Eyes: Conjunctivae and EOM are normal.  Neck: Neck supple. No tracheal deviation present. No thyromegaly present.  No carotid bruit  Cardiovascular: Normal rate, regular rhythm and normal heart sounds.  No murmur heard.  No edema. Pulmonary/Chest: Effort normal and breath sounds normal. No respiratory distress. She has no wheezes. She has no rales.  Breast: deferred   Abdominal: Soft. She exhibits no distension. There is no tenderness.  Lymphadenopathy: She has no cervical adenopathy.  Skin: Skin is warm and dry. She is not diaphoretic.  Psychiatric: She has a normal mood and affect. Her behavior is normal.   The 10-year ASCVD risk score Mikey Bussing DC Jr., et al., 2013) is: 17.4%   Values used to calculate the score:     Age: 55 years     Sex: Female     Is Non-Hispanic African American: No     Diabetic: No     Tobacco smoker: No     Systolic Blood Pressure: 161 mmHg     Is BP treated: Yes     HDL Cholesterol: 89.8 mg/dL     Total Cholesterol: 261 mg/dL      Assessment & Plan:   Physical exam: Screening blood work ordered Immunizations  tdap due, others up to date Colonoscopy   Up to date  - due 07/2019 Mammogram  Up to date  Gyn  No longer sees  Dexa     Up to date - due 07/2019 Eye exams   Up to date  Exercise  Walking irregularly Weight   normal BMI Skin    No concerns Substance abuse  none  See Problem List for Assessment and Plan of chronic medical problems.   FU in 6 months

## 2019-03-10 ENCOUNTER — Encounter: Payer: Self-pay | Admitting: Internal Medicine

## 2019-03-10 ENCOUNTER — Other Ambulatory Visit: Payer: Self-pay

## 2019-03-10 ENCOUNTER — Ambulatory Visit (INDEPENDENT_AMBULATORY_CARE_PROVIDER_SITE_OTHER): Payer: PPO | Admitting: Internal Medicine

## 2019-03-10 VITALS — BP 136/84 | HR 70 | Temp 98.1°F | Resp 16 | Ht 61.5 in | Wt 137.0 lb

## 2019-03-10 DIAGNOSIS — E7849 Other hyperlipidemia: Secondary | ICD-10-CM | POA: Diagnosis not present

## 2019-03-10 DIAGNOSIS — M810 Age-related osteoporosis without current pathological fracture: Secondary | ICD-10-CM

## 2019-03-10 DIAGNOSIS — Z0000 Encounter for general adult medical examination without abnormal findings: Secondary | ICD-10-CM | POA: Diagnosis not present

## 2019-03-10 DIAGNOSIS — R739 Hyperglycemia, unspecified: Secondary | ICD-10-CM | POA: Diagnosis not present

## 2019-03-10 DIAGNOSIS — I10 Essential (primary) hypertension: Secondary | ICD-10-CM | POA: Diagnosis not present

## 2019-03-10 MED ORDER — AMLODIPINE BESYLATE 2.5 MG PO TABS: 2.5000 mg | ORAL_TABLET | Freq: Every day | ORAL | 1 refills | Status: DC

## 2019-03-10 MED ORDER — LOSARTAN POTASSIUM 50 MG PO TABS: 50.0000 mg | ORAL_TABLET | Freq: Every day | ORAL | 1 refills | Status: DC

## 2019-03-10 NOTE — Assessment & Plan Note (Signed)
a1c

## 2019-03-10 NOTE — Assessment & Plan Note (Signed)
BP well controlled Current regimen effective and well tolerated Continue current medications at current doses cmp  

## 2019-03-10 NOTE — Patient Instructions (Addendum)
All other Health Maintenance issues reviewed.   All recommended immunizations and age-appropriate screenings are up-to-date or discussed.  No immunizations administered today.   Medications reviewed and updated.  Changes include :   none    Please followup in 6 months   Health Maintenance, Female Adopting a healthy lifestyle and getting preventive care can go a long way to promote health and wellness. Talk with your health care provider about what schedule of regular examinations is right for you. This is a good chance for you to check in with your provider about disease prevention and staying healthy. In between checkups, there are plenty of things you can do on your own. Experts have done a lot of research about which lifestyle changes and preventive measures are most likely to keep you healthy. Ask your health care provider for more information. Weight and diet Eat a healthy diet  Be sure to include plenty of vegetables, fruits, low-fat dairy products, and lean protein.  Do not eat a lot of foods high in solid fats, added sugars, or salt.  Get regular exercise. This is one of the most important things you can do for your health. ? Most adults should exercise for at least 150 minutes each week. The exercise should increase your heart rate and make you sweat (moderate-intensity exercise). ? Most adults should also do strengthening exercises at least twice a week. This is in addition to the moderate-intensity exercise. Maintain a healthy weight  Body mass index (BMI) is a measurement that can be used to identify possible weight problems. It estimates body fat based on height and weight. Your health care provider can help determine your BMI and help you achieve or maintain a healthy weight.  For females 55 years of age and older: ? A BMI below 18.5 is considered underweight. ? A BMI of 18.5 to 24.9 is normal. ? A BMI of 25 to 29.9 is considered overweight. ? A BMI of 30 and above is  considered obese. Watch levels of cholesterol and blood lipids  You should start having your blood tested for lipids and cholesterol at 72 years of age, then have this test every 5 years.  You may need to have your cholesterol levels checked more often if: ? Your lipid or cholesterol levels are high. ? You are older than 72 years of age. ? You are at high risk for heart disease. Cancer screening Lung Cancer  Lung cancer screening is recommended for adults 80-89 years old who are at high risk for lung cancer because of a history of smoking.  A yearly low-dose CT scan of the lungs is recommended for people who: ? Currently smoke. ? Have quit within the past 15 years. ? Have at least a 30-pack-year history of smoking. A pack year is smoking an average of one pack of cigarettes a day for 1 year.  Yearly screening should continue until it has been 15 years since you quit.  Yearly screening should stop if you develop a health problem that would prevent you from having lung cancer treatment. Breast Cancer  Practice breast self-awareness. This means understanding how your breasts normally appear and feel.  It also means doing regular breast self-exams. Let your health care provider know about any changes, no matter how small.  If you are in your 20s or 30s, you should have a clinical breast exam (CBE) by a health care provider every 1-3 years as part of a regular health exam.  If you are 40  older, have a CBE every year. Also consider having a breast X-ray (mammogram) every year.  If you have a family history of breast cancer, talk to your health care provider about genetic screening.  If you are at high risk for breast cancer, talk to your health care provider about having an MRI and a mammogram every year.  Breast cancer gene (BRCA) assessment is recommended for women who have family members with BRCA-related cancers. BRCA-related cancers  include: ? Breast. ? Ovarian. ? Tubal. ? Peritoneal cancers.  Results of the assessment will determine the need for genetic counseling and BRCA1 and BRCA2 testing. Cervical Cancer Your health care provider may recommend that you be screened regularly for cancer of the pelvic organs (ovaries, uterus, and vagina). This screening involves a pelvic examination, including checking for microscopic changes to the surface of your cervix (Pap test). You may be encouraged to have this screening done every 3 years, beginning at age 21.  For women ages 30-65, health care providers may recommend pelvic exams and Pap testing every 3 years, or they may recommend the Pap and pelvic exam, combined with testing for human papilloma virus (HPV), every 5 years. Some types of HPV increase your risk of cervical cancer. Testing for HPV may also be done on women of any age with unclear Pap test results.  Other health care providers may not recommend any screening for nonpregnant women who are considered low risk for pelvic cancer and who do not have symptoms. Ask your health care provider if a screening pelvic exam is right for you.  If you have had past treatment for cervical cancer or a condition that could lead to cancer, you need Pap tests and screening for cancer for at least 20 years after your treatment. If Pap tests have been discontinued, your risk factors (such as having a new sexual partner) need to be reassessed to determine if screening should resume. Some women have medical problems that increase the chance of getting cervical cancer. In these cases, your health care provider may recommend more frequent screening and Pap tests. Colorectal Cancer  This type of cancer can be detected and often prevented.  Routine colorectal cancer screening usually begins at 72 years of age and continues through 72 years of age.  Your health care provider may recommend screening at an earlier age if you have risk factors for  colon cancer.  Your health care provider may also recommend using home test kits to check for hidden blood in the stool.  A small camera at the end of a tube can be used to examine your colon directly (sigmoidoscopy or colonoscopy). This is done to check for the earliest forms of colorectal cancer.  Routine screening usually begins at age 50.  Direct examination of the colon should be repeated every 5-10 years through 72 years of age. However, you may need to be screened more often if early forms of precancerous polyps or small growths are found. Skin Cancer  Check your skin from head to toe regularly.  Tell your health care provider about any new moles or changes in moles, especially if there is a change in a mole's shape or color.  Also tell your health care provider if you have a mole that is larger than the size of a pencil eraser.  Always use sunscreen. Apply sunscreen liberally and repeatedly throughout the day.  Protect yourself by wearing long sleeves, pants, a wide-brimmed hat, and sunglasses whenever you are outside. Heart disease, diabetes,   diabetes, and high blood pressure  High blood pressure causes heart disease and increases the risk of stroke. High blood pressure is more likely to develop in: ? People who have blood pressure in the high end of the normal range (130-139/85-89 mm Hg). ? People who are overweight or obese. ? People who are African American.  If you are 70-43 years of age, have your blood pressure checked every 3-5 years. If you are 41 years of age or older, have your blood pressure checked every year. You should have your blood pressure measured twice-once when you are at a hospital or clinic, and once when you are not at a hospital or clinic. Record the average of the two measurements. To check your blood pressure when you are not at a hospital or clinic, you can use: ? An automated blood pressure machine at a pharmacy. ? A home blood pressure monitor.  If you are  between 55 years and 39 years old, ask your health care provider if you should take aspirin to prevent strokes.  Have regular diabetes screenings. This involves taking a blood sample to check your fasting blood sugar level. ? If you are at a normal weight and have a low risk for diabetes, have this test once every three years after 72 years of age. ? If you are overweight and have a high risk for diabetes, consider being tested at a younger age or more often. Preventing infection Hepatitis B  If you have a higher risk for hepatitis B, you should be screened for this virus. You are considered at high risk for hepatitis B if: ? You were born in a country where hepatitis B is common. Ask your health care provider which countries are considered high risk. ? Your parents were born in a high-risk country, and you have not been immunized against hepatitis B (hepatitis B vaccine). ? You have HIV or AIDS. ? You use needles to inject street drugs. ? You live with someone who has hepatitis B. ? You have had sex with someone who has hepatitis B. ? You get hemodialysis treatment. ? You take certain medicines for conditions, including cancer, organ transplantation, and autoimmune conditions. Hepatitis C  Blood testing is recommended for: ? Everyone born from [birth year] through 1964. ? Anyone with known risk factors for hepatitis C. Sexually transmitted infections (STIs)  You should be screened for sexually transmitted infections (STIs) including gonorrhea and chlamydia if: ? You are sexually active and are younger than 72 years of age. ? You are older than 72 years of age and your health care provider tells you that you are at risk for this type of infection. ? Your sexual activity has changed since you were last screened and you are at an increased risk for chlamydia or gonorrhea. Ask your health care provider if you are at risk.  If you do not have HIV, but are at risk, it may be recommended that you take  a prescription medicine daily to prevent HIV infection. This is called pre-exposure prophylaxis (PrEP). You are considered at risk if: ? You are sexually active and do not regularly use condoms or know the HIV status of your partner(s). ? You take drugs by injection. ? You are sexually active with a partner who has HIV. Talk with your health care provider about whether you are at high risk of being infected with HIV. If you choose to begin PrEP, you should first be tested for HIV. You should then be tested  every 3 months for as long as you are taking PrEP. Pregnancy  If you are premenopausal and you may become pregnant, ask your health care provider about preconception counseling.  If you may become pregnant, take 400 to 800 micrograms (mcg) of folic acid every day.  If you want to prevent pregnancy, talk to your health care provider about birth control (contraception). Osteoporosis and menopause  Osteoporosis is a disease in which the bones lose minerals and strength with aging. This can result in serious bone fractures. Your risk for osteoporosis can be identified using a bone density scan.  If you are 30 years of age or older, or if you are at risk for osteoporosis and fractures, ask your health care provider if you should be screened.  Ask your health care provider whether you should take a calcium or vitamin D supplement to lower your risk for osteoporosis.  Menopause may have certain physical symptoms and risks.  Hormone replacement therapy may reduce some of these symptoms and risks. Talk to your health care provider about whether hormone replacement therapy is right for you. Follow these instructions at home:  Schedule regular health, dental, and eye exams.  Stay current with your immunizations.  Do not use any tobacco products including cigarettes, chewing tobacco, or electronic cigarettes.  If you are pregnant, do not drink alcohol.  If you are breastfeeding, limit how  much and how often you drink alcohol.  Limit alcohol intake to no more than 1 drink per day for nonpregnant women. One drink equals 12 ounces of beer, 5 ounces of wine, or 1 ounces of hard liquor.  Do not use street drugs.  Do not share needles.  Ask your health care provider for help if you need support or information about quitting drugs.  Tell your health care provider if you often feel depressed.  Tell your health care provider if you have ever been abused or do not feel safe at home. This information is not intended to replace advice given to you by your health care provider. Make sure you discuss any questions you have with your health care provider. Document Released: 03/26/2011 Document Revised: 02/16/2016 Document Reviewed: 06/14/2015 Elsevier Interactive Patient Education  2019 [employer].

## 2019-03-10 NOTE — Assessment & Plan Note (Signed)
Diet controlled Lipid panel, cmp Regular exercise and healthy diet encouraged

## 2019-03-10 NOTE — Assessment & Plan Note (Signed)
Due in December - she is not sure she wants to have it done b/c she knows she does not want to go on any medication Stressed regular exercise Taking calcium and vitamin D daily

## 2019-03-11 ENCOUNTER — Encounter: Payer: PPO | Admitting: Internal Medicine

## 2019-03-11 DIAGNOSIS — K1329 Other disturbances of oral epithelium, including tongue: Secondary | ICD-10-CM | POA: Diagnosis not present

## 2019-04-21 DIAGNOSIS — L570 Actinic keratosis: Secondary | ICD-10-CM | POA: Diagnosis not present

## 2019-04-21 DIAGNOSIS — Z85828 Personal history of other malignant neoplasm of skin: Secondary | ICD-10-CM | POA: Diagnosis not present

## 2019-04-21 DIAGNOSIS — L905 Scar conditions and fibrosis of skin: Secondary | ICD-10-CM | POA: Diagnosis not present

## 2019-05-12 DIAGNOSIS — M12272 Villonodular synovitis (pigmented), left ankle and foot: Secondary | ICD-10-CM | POA: Diagnosis not present

## 2019-05-26 HISTORY — PX: CATARACT EXTRACTION, BILATERAL: SHX1313

## 2019-05-27 DIAGNOSIS — D2272 Melanocytic nevi of left lower limb, including hip: Secondary | ICD-10-CM | POA: Diagnosis not present

## 2019-05-27 DIAGNOSIS — L821 Other seborrheic keratosis: Secondary | ICD-10-CM | POA: Diagnosis not present

## 2019-05-27 DIAGNOSIS — Z85828 Personal history of other malignant neoplasm of skin: Secondary | ICD-10-CM | POA: Diagnosis not present

## 2019-05-27 DIAGNOSIS — L918 Other hypertrophic disorders of the skin: Secondary | ICD-10-CM | POA: Diagnosis not present

## 2019-05-27 DIAGNOSIS — D225 Melanocytic nevi of trunk: Secondary | ICD-10-CM | POA: Diagnosis not present

## 2019-05-27 DIAGNOSIS — D2262 Melanocytic nevi of left upper limb, including shoulder: Secondary | ICD-10-CM | POA: Diagnosis not present

## 2019-05-27 DIAGNOSIS — L814 Other melanin hyperpigmentation: Secondary | ICD-10-CM | POA: Diagnosis not present

## 2019-06-09 NOTE — Progress Notes (Addendum)
Subjective:   Heather Ramsey is a 73 y.o. female who presents for Medicare Annual (Subsequent) preventive examination.  Review of Systems:   Cardiac Risk Factors include: advanced age (>46men, >70 women);dyslipidemia;hypertension Sleep patterns: has interrupted sleep, gets up 1-2 times nightly to void and sleeps 4-5 hours nightly. Patient reports insomnia issues, discussed recommended sleep tips.    Home Safety/Smoke Alarms: Feels safe in home. Smoke alarms in place.  Living environment; residence and Firearm Safety: 1-story house/ trailer. Lives with husband, no needs for DME, good support system Seat Belt Safety/Bike Helmet: Wears seat belt.     Objective:     Vitals: BP 132/76   Pulse 62   Resp 17   Ht 5\' 2"  (1.575 m)   Wt 134 lb (60.8 kg)   SpO2 98%   BMI 24.51 kg/m   Body mass index is 24.51 kg/m.  Advanced Directives 06/10/2019 05/07/2018 05/02/2017 02/20/2017 07/03/2016 07/22/2014  Does Patient Have a Medical Advance Directive? Yes Yes Yes Yes Yes Yes  Type of Paramedic of [place];Living will [place];Living will [place];Living will Living will [place];Living will [place];Living will  Does patient want to make changes to medical advance directive? - - - No - Patient declined No - Patient declined -  Copy of [place] in Chart? No - copy requested No - copy requested No - copy requested - No - copy requested -    Tobacco Social History   Tobacco Use  Smoking Status Never Smoker  Smokeless Tobacco Never Used     Counseling given: Not Answered  Past Medical History:  Diagnosis Date  . Allergy   . Atrophic vaginitis   . Chest pain    negative  cardiac evaluation; Dr Percival Spanish  . Diverticulosis of colon 2005& 2010   FH colon cancer  . Fibromyalgia    Dr Estanislado Pandy  . GERD (gastroesophageal reflux disease)   . Heart murmur   .  Hemorrhoid   . Hyperlipidemia   . Hypertension   . IBS (irritable bowel syndrome)   . Migraine   . MVP (mitral valve prolapse)   . Osteopenia   . Pulmonary nodule 07/2006  . UTI (lower urinary tract infection) 2005   Citrobacter koseri, hospitalized w/ kidney infection 1983   Past Surgical History:  Procedure Laterality Date  . back surgy  02/26/2017   lumbar 4-5  . BREAST BIOPSY Left 2003  . [place]   with bladder tack   . COLONOSCOPY  07/2014   negative; Dr Olevia Perches  . FOOT SURGERY Left 2014   Family History  Problem Relation Age of Onset  . Heart attack Father 64       Died with multiple medical problems  . Colon cancer Father 20  . Diabetes Father   . Tuberculosis Paternal Grandmother   . Stroke Mother 2  . Hypothyroidism Mother   . Hypertension Mother   . Hypothyroidism Sister   . Hypertension Sister   . Breast cancer Sister   . Diabetes Sister   . Heart attack Sister        2 sisters > 84   Social History   Socioeconomic History  . Marital status: Married    Spouse name: Not on file  . Number of children: 2  . Years of education: Not on file  . Highest education level: Not on file  Occupational History  .  Occupation: retired  Scientific laboratory technician  . Financial resource strain: Not hard at all  . Food insecurity    Worry: Never true    Inability: Never true  . Transportation needs    Medical: No    Non-medical: No  Tobacco Use  . Smoking status: Never Smoker  . Smokeless tobacco: Never Used  Substance and Sexual Activity  . Alcohol use: No  . Drug use: No  . Sexual activity: Yes    Birth control/protection: Post-menopausal  Lifestyle  . Physical activity    Days per week: 4 days    Minutes per session: 40 min  . Stress: To some extent  Relationships  . Social connections    Talks on phone: More than three times a week    Gets together: More than three times a week    Attends religious service: Never    Active member of club or  organization: Yes    Attends meetings of clubs or organizations: More than 4 times per year    Relationship status: Married  Other Topics Concern  . Not on file  Social History Narrative   Regular exercise- yes: walks, but not as much due to knee arthritis.  Does stretching   Lives with husband.          Outpatient Encounter Medications as of 06/10/2019  Medication Sig  . amLODipine (NORVASC) 2.5 MG tablet Take 1 tablet (2.5 mg total) by mouth daily.  Marland Kitchen b complex vitamins tablet Take 1 tablet by mouth daily.    . Calcium Carbonate-Vitamin D (CALCIUM + D PO) Take 1 tablet by mouth 2 (two) times daily.   . Dietary Management Product (VASCULERA) TABS Take 1 tablet by mouth daily.  . Docosahexaenoic Acid (DHA ALGAL-900 PO) Take 900 mg by mouth daily.  . fish oil-omega-3 fatty acids 1000 MG capsule Take 1-2 g by mouth See admin instructions. 1 cap in the morning, 2 caps in the evening  . Ginger, Zingiber officinalis, (GINGER ROOT) 550 MG CAPS Take 1 capsule by mouth daily.   Marland Kitchen losartan (COZAAR) 50 MG tablet Take 1 tablet (50 mg total) by mouth daily.  . magnesium 30 MG tablet Take 30 mg by mouth 2 (two) times daily.    . Misc Natural Products (TART CHERRY ADVANCED PO) Take by mouth daily.  . Multiple Vitamins-Minerals (ICAPS AREDS 2 PO) Take 1 capsule by mouth daily.  . Probiotic Product (PROBIOTIC-10 PO) Take by mouth daily.  . Psyllium (METAMUCIL) 28.3 % POWD Take 1 scoop dissolved in at least 8 ounces water/juice and drink twice daily (Patient taking differently: as needed. Take 1 scoop dissolved in at least 8 ounces water/juice and drink twice daily)  . TURMERIC PO Take 400 mg by mouth daily.  . [DISCONTINUED] amLODipine (NORVASC) 2.5 MG tablet Take 1 tablet (2.5 mg total) by mouth daily.  . [DISCONTINUED] Dietary Management Product (VASCULERA) TABS TAKE ONE TABLET BY MOUTH EVERY DAY   No facility-administered encounter medications on file as of 06/10/2019.     Activities of Daily  Living In your present state of health, do you have any difficulty performing the following activities: 06/10/2019  Hearing? N  Vision? N  Difficulty concentrating or making decisions? N  Walking or climbing stairs? N  Dressing or bathing? N  Doing errands, shopping? N  Preparing Food and eating ? N  Using the Toilet? N  In the past six months, have you accidently leaked urine? N  Do you have problems with  loss of bowel control? N  Managing your Medications? N  Managing your Finances? N  Housekeeping or managing your Housekeeping? N  Some recent data might be hidden    Patient Care Team: Binnie Rail, MD as PCP - General (Internal Medicine) Kristeen Miss, MD as Consulting Physician (Neurosurgery)    Assessment:   This is a routine wellness examination for Heather Ramsey. Physical assessment deferred to PCP.  Exercise Activities and Dietary recommendations Current Exercise Habits: Home exercise routine, Type of exercise: walking, Time (Minutes): 40, Frequency (Times/Week): 3, Weekly Exercise (Minutes/Week): 120, Intensity: Mild, Exercise limited by: orthopedic condition(s) Diet (meal preparation, eat out, water intake, caffeinated beverages, dairy products, fruits and vegetables): in general, a "healthy" diet  , well balanced   Reviewed heart healthy diet. Encouraged patient to increase daily water and healthy fluid intake.    Goals    . Increase my physical activity as tolerated after back surgery.     Continue to be as healthy as possible, enjoy life, nature, family and church.    . Patient Stated     Take time for me daily to read and relax. After the move, I will start to walk several times weekly.       Fall Risk Fall Risk  06/10/2019 03/10/2019 05/07/2018 07/30/2017 05/02/2017  Falls in the past year? 0 0 No No No  Number falls in past yr: 0 0 - - -  Injury with Fall? 0 - - - -   Depression Screen PHQ 2/9 Scores 06/10/2019 03/10/2019 05/07/2018 07/30/2017  PHQ - 2 Score 1 0 1  0  PHQ- 9 Score 4 - - -     Cognitive Function       Ad8 score reviewed for issues:  Issues making decisions: no  Less interest in hobbies / activities: no  Repeats questions, stories (family complaining): no  Trouble using ordinary gadgets (microwave, computer, phone):no  Forgets the month or year: no  Mismanaging finances: no  Remembering appts: no  Daily problems with thinking and/or memory: no Ad8 score is= 0  Immunization History  Administered Date(s) Administered  . Influenza Whole 07/09/2007, 06/21/2010, 07/25/2012, 07/24/2013  . Influenza, High Dose Seasonal PF 07/20/2014, 07/04/2015, 07/07/2018, 07/07/2018  . Influenza,inj,Quad PF,6+ Mos 06/20/2017  . Influenza-Unspecified 07/03/2016  . Pneumococcal Conjugate-13 08/31/2014, 11/15/2015  . Pneumococcal Polysaccharide-23 05/27/2008, 10/21/2013  . Td 01/01/2008  . Zoster 09/24/2008  . Zoster Recombinat (Shingrix) 02/06/2018, 04/11/2018   Screening Tests Health Maintenance  Topic Date Due  . INFLUENZA VACCINE  12/24/2019 (Originally 04/25/2019)  . TETANUS/TDAP  03/09/2020 (Originally 12/31/2017)  . DEXA SCAN  08/03/2019  . COLONOSCOPY  08/05/2019  . MAMMOGRAM  08/05/2020  . Hepatitis C Screening  Completed  . PNA vac Low Risk Adult  Completed      Plan:    Reviewed health maintenance screenings with patient today and relevant education, vaccines, and/or referrals were provided.   I have personally reviewed and noted the following in the patient's chart:   . Medical and social history . Use of alcohol, tobacco or illicit drugs  . Current medications and supplements . Functional ability and status . Nutritional status . Physical activity . Advanced directives . List of other physicians . Vitals . Screenings to include cognitive, depression, and falls . Referrals and appointments  In addition, I have reviewed and discussed with patient certain preventive protocols, quality metrics, and best practice  recommendations. A written personalized care plan for preventive services as well as general preventive  health recommendations were provided to patient.     Michiel Cowboy, RN  06/10/2019   Medical screening examination/treatment/procedure(s) were performed by non-physician practitioner and as supervising physician I was immediately available for consultation/collaboration. I agree with above. Binnie Rail, MD

## 2019-06-10 ENCOUNTER — Other Ambulatory Visit: Payer: Self-pay

## 2019-06-10 ENCOUNTER — Ambulatory Visit (INDEPENDENT_AMBULATORY_CARE_PROVIDER_SITE_OTHER): Payer: PPO | Admitting: *Deleted

## 2019-06-10 VITALS — BP 132/76 | HR 62 | Resp 17 | Ht 62.0 in | Wt 134.0 lb

## 2019-06-10 DIAGNOSIS — I872 Venous insufficiency (chronic) (peripheral): Secondary | ICD-10-CM

## 2019-06-10 DIAGNOSIS — Z0000 Encounter for general adult medical examination without abnormal findings: Secondary | ICD-10-CM | POA: Diagnosis not present

## 2019-06-10 MED ORDER — AMLODIPINE BESYLATE 2.5 MG PO TABS: 2.5000 mg | ORAL_TABLET | Freq: Every day | ORAL | 1 refills | Status: DC

## 2019-06-10 MED ORDER — VASCULERA PO TABS: 1.0000 | ORAL_TABLET | Freq: Every day | ORAL | 1 refills | Status: DC

## 2019-06-10 NOTE — Patient Instructions (Signed)
Continue doing brain stimulating activities (puzzles, reading, adult coloring books, staying active) to keep memory sharp.   Continue to eat heart healthy diet (full of fruits, vegetables, whole grains, lean protein, water--limit salt, fat, and sugar intake) and increase physical activity as tolerated.   Heather Ramsey , Thank you for taking time to come for your Medicare Wellness Visit. I appreciate your ongoing commitment to your health goals. Please review the following plan we discussed and let me know if I can assist you in the future.   These are the goals we discussed: Goals    . Increase my physical activity as tolerated after back surgery.     Continue to be as healthy as possible, enjoy life, nature, family and church.    . Patient Stated     Take time for me daily to read and relax. After the move, I will start to walk several times weekly.       This is a list of the screening recommended for you and due dates:  Health Maintenance  Topic Date Due  . Flu Shot  12/24/2019*  . Tetanus Vaccine  03/09/2020*  . DEXA scan (bone density measurement)  08/03/2019  . Colon Cancer Screening  08/05/2019  . Mammogram  08/05/2020  .  Hepatitis C: One time screening is recommended by Center for Disease Control  (CDC) for  adults born from [birth year] through 1964.   Completed  . Pneumonia vaccines  Completed  *Topic was postponed. The date shown is not the original due date.    Preventive Care 72 Years and Older, Female Preventive care refers to lifestyle choices and visits with your health care provider that can promote health and wellness. This includes:  A yearly physical exam. This is also called an annual well check.  Regular dental and eye exams.  Immunizations.  Screening for certain conditions.  Healthy lifestyle choices, such as diet and exercise. What can I expect for my preventive care visit? Physical exam Your health care provider will check:  Height and weight. These  may be used to calculate body mass index (BMI), which is a measurement that tells if you are at a healthy weight.  Heart rate and blood pressure.  Your skin for abnormal spots. Counseling Your health care provider may ask you questions about:  Alcohol, tobacco, and drug use.  Emotional well-being.  Home and relationship well-being.  Sexual activity.  Eating habits.  History of falls.  Memory and ability to understand (cognition).  Work and work Statistician.  Pregnancy and menstrual history. What immunizations do I need?  Influenza (flu) vaccine  This is recommended every year. Tetanus, diphtheria, and pertussis (Tdap) vaccine  You may need a Td booster every 10 years. Varicella (chickenpox) vaccine  You may need this vaccine if you have not already been vaccinated. Zoster (shingles) vaccine  You may need this after age 16. Pneumococcal conjugate (PCV13) vaccine  One dose is recommended after age 35. Pneumococcal polysaccharide (PPSV23) vaccine  One dose is recommended after age 6. Measles, mumps, and rubella (MMR) vaccine  You may need at least one dose of MMR if you were born in 1957 or later. You may also need a second dose. Meningococcal conjugate (MenACWY) vaccine  You may need this if you have certain conditions. Hepatitis A vaccine  You may need this if you have certain conditions or if you travel or work in places where you may be exposed to hepatitis A. Hepatitis B vaccine  You may  need this if you have certain conditions or if you travel or work in places where you may be exposed to hepatitis B. Haemophilus influenzae type b (Hib) vaccine  You may need this if you have certain conditions. You may receive vaccines as individual doses or as more than one vaccine together in one shot (combination vaccines). Talk with your health care provider about the risks and benefits of combination vaccines. What tests do I need? Blood tests  Lipid and  cholesterol levels. These may be checked every 5 years, or more frequently depending on your overall health.  Hepatitis C test.  Hepatitis B test. Screening  Lung cancer screening. You may have this screening every year starting at age 57 if you have a 30-pack-year history of smoking and currently smoke or have quit within the past 15 years.  Colorectal cancer screening. All adults should have this screening starting at age 74 and continuing until age 5. Your health care provider may recommend screening at age 30 if you are at increased risk. You will have tests every 1-10 years, depending on your results and the type of screening test.  Diabetes screening. This is done by checking your blood sugar (glucose) after you have not eaten for a while (fasting). You may have this done every 1-3 years.  Mammogram. This may be done every 1-2 years. Talk with your health care provider about how often you should have regular mammograms.  BRCA-related cancer screening. This may be done if you have a family history of breast, ovarian, tubal, or peritoneal cancers. Other tests  Sexually transmitted disease (STD) testing.  Bone density scan. This is done to screen for osteoporosis. You may have this done starting at age 39. Follow these instructions at home: Eating and drinking  Eat a diet that includes fresh fruits and vegetables, whole grains, lean protein, and low-fat dairy products. Limit your intake of foods with high amounts of sugar, saturated fats, and salt.  Take vitamin and mineral supplements as recommended by your health care provider.  Do not drink alcohol if your health care provider tells you not to drink.  If you drink alcohol: ? Limit how much you have to 0-1 drink a day. ? Be aware of how much alcohol is in your drink. In the [place] one drink equals one 12 oz bottle of beer (355 mL), one 5 oz glass of wine (148 mL), or one 1 oz glass of hard liquor (44 mL). Lifestyle  Take  daily care of your teeth and gums.  Stay active. Exercise for at least 30 minutes on 5 or more days each week.  Do not use any products that contain nicotine or tobacco, such as cigarettes, e-cigarettes, and chewing tobacco. If you need help quitting, ask your health care provider.  If you are sexually active, practice safe sex. Use a condom or other form of protection in order to prevent STIs (sexually transmitted infections).  Talk with your health care provider about taking a low-dose aspirin or statin. What's next?  Go to your health care provider once a year for a well check visit.  Ask your health care provider how often you should have your eyes and teeth checked.  Stay up to date on all vaccines. This information is not intended to replace advice given to you by your health care provider. Make sure you discuss any questions you have with your health care provider. Document Released: 10/07/2015 Document Revised: 09/04/2018 Document Reviewed: 09/04/2018 Elsevier Patient Education  [place].

## 2019-06-23 DIAGNOSIS — H268 Other specified cataract: Secondary | ICD-10-CM | POA: Diagnosis not present

## 2019-06-23 DIAGNOSIS — H25812 Combined forms of age-related cataract, left eye: Secondary | ICD-10-CM | POA: Diagnosis not present

## 2019-06-23 DIAGNOSIS — H21562 Pupillary abnormality, left eye: Secondary | ICD-10-CM | POA: Diagnosis not present

## 2019-06-30 ENCOUNTER — Other Ambulatory Visit: Payer: Self-pay | Admitting: Internal Medicine

## 2019-07-10 ENCOUNTER — Encounter: Payer: Self-pay | Admitting: Gastroenterology

## 2019-07-14 DIAGNOSIS — H25811 Combined forms of age-related cataract, right eye: Secondary | ICD-10-CM | POA: Diagnosis not present

## 2019-08-07 DIAGNOSIS — Z1231 Encounter for screening mammogram for malignant neoplasm of breast: Secondary | ICD-10-CM | POA: Diagnosis not present

## 2019-08-07 LAB — HM MAMMOGRAPHY

## 2019-08-30 ENCOUNTER — Other Ambulatory Visit: Payer: Self-pay | Admitting: Internal Medicine

## 2019-08-30 DIAGNOSIS — I872 Venous insufficiency (chronic) (peripheral): Secondary | ICD-10-CM

## 2019-08-31 DIAGNOSIS — H269 Unspecified cataract: Secondary | ICD-10-CM | POA: Diagnosis not present

## 2019-09-09 DIAGNOSIS — R7989 Other specified abnormal findings of blood chemistry: Secondary | ICD-10-CM | POA: Insufficient documentation

## 2019-09-09 NOTE — Progress Notes (Addendum)
Subjective:    Patient ID: Heather Ramsey, female    DOB: 1945/10/10, 73 y.o.   MRN: [id number]  HPI The patient is here for follow up.  She is not exercising regularly.   She has gained weight.    Hypertension: She is taking her medication daily. She is compliant with a low sodium diet.  She denies chest pain, palpitations, edema, shortness of breath and regular headaches. She does not monitor her blood pressure at home.    Hyperlipidemia: She is taking her medication daily. She is compliant with a low fat/cholesterol diet. She denies myalgias.   Hyperglycemia:  She is compliant with a low sugar/carbohydrate diet.  She is not exercising regularly.  Elevated tsh:  She has gained weight but knows it is related to eating more.    Osteoporosis:  She is due for a dexa now.  She does not want to be on any medication for her bones.  She is taking calcium and vitamin d daily.  She is not exercising regularly.     Medications and allergies reviewed with patient and updated if appropriate.  Patient Active Problem List   Diagnosis Date Noted  . Elevated TSH 09/09/2019  . Cardiac murmur 01/07/2018  . History of IBS 01/03/2018  . Osteoporosis 08/18/2017  . Hyperglycemia 07/30/2017  . Deficiency anemia 04/09/2017  . Localized edema 04/09/2017  . Venous stasis dermatitis of both lower extremities 04/09/2017  . Spondylolisthesis at L4-L5 level 02/26/2017  . Fibromyalgia 01/03/2017  . Primary insomnia 01/03/2017  . Primary osteoarthritis of both hands 01/03/2017  . Trochanteric bursitis of both hips 01/03/2017  . Hypertension 12/29/2016  . 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 Lumbar radiculopathy 09/04/2016  . 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 Abnormal EKG 02/05/2012  . Atrophic vaginitis   . Basal cell cancer 04/24/2011  . DIVERTICULOSIS, COLON 06/09/2009  . Hyperlipidemia 05/31/2008  . IRRITABLE BOWEL SYNDROME  05/27/2008  . Myalgia and myositis 05/27/2008  . Chronic fatigue syndrome 05/27/2008    Current Outpatient Medications on File Prior to Visit  Medication Sig Dispense Refill  . amLODipine (NORVASC) 2.5 MG tablet Take 1 tablet (2.5 mg total) by mouth daily. 90 tablet 1  . b complex vitamins tablet Take 1 tablet by mouth daily.      . Calcium Carbonate-Vitamin D (CALCIUM + D PO) Take 1 tablet by mouth 2 (two) times daily.     . Dietary Management Product (VASCULERA) TABS TAKE ONE TABLET BY MOUTH EVERY DAY 90 tablet 1  . Docosahexaenoic Acid (DHA ALGAL-900 PO) Take 900 mg by mouth daily.    . fish oil-omega-3 fatty acids 1000 MG capsule Take 1-2 g by mouth See admin instructions. 1 cap in the morning, 2 caps in the evening    . Ginger, Zingiber officinalis, (GINGER ROOT) 550 MG CAPS Take 1 capsule by mouth daily.     Marland Kitchen losartan (COZAAR) 50 MG tablet Take 1 tablet by mouth once daily 90 tablet 0  . magnesium 30 MG tablet Take 30 mg by mouth 2 (two) times daily.      . Misc Natural Products (TART CHERRY ADVANCED PO) Take by mouth daily.    Marland Kitchen moxifloxacin (VIGAMOX) 0.5 % ophthalmic solution INSTILL 1 DROP INTO RIGHT EYE 4 TIMES DAILY STARTING 2 DAYS PRIOR TO SURGERY AND 2 DROPS MORNING OF SURGERY    . Multiple Vitamins-Minerals (ICAPS AREDS 2 PO) Take 1 capsule by mouth daily.    Marland Kitchen  prednisoLONE acetate (PRED FORTE) 1 % ophthalmic suspension Place 1 drop into the right eye 4 (four) times daily.    . Probiotic Product (PROBIOTIC-10 PO) Take by mouth daily.    Marland Kitchen PROLENSA 0.07 % SOLN Place 1 drop into the right eye daily.    . Psyllium (METAMUCIL) 28.3 % POWD Take 1 scoop dissolved in at least 8 ounces water/juice and drink twice daily (Patient taking differently: as needed. Take 1 scoop dissolved in at least 8 ounces water/juice and drink twice daily) 1 Bottle 0  . TURMERIC PO Take 400 mg by mouth daily.     No current facility-administered medications on file prior to visit.    Past Medical  History:  Diagnosis Date  . Allergy   . Atrophic vaginitis   . Chest pain    negative  cardiac evaluation; Dr Percival Spanish  . Diverticulosis of colon 2005& 2010   FH colon cancer  . Fibromyalgia    Dr Estanislado Pandy  . GERD (gastroesophageal reflux disease)   . Heart murmur   . Hemorrhoid   . Hyperlipidemia   . Hypertension   . IBS (irritable bowel syndrome)   . Migraine   . MVP (mitral valve prolapse)   . Osteopenia   . Pulmonary nodule 07/2006  . UTI (lower urinary tract infection) 2005   Citrobacter koseri, hospitalized w/ kidney infection 1983    Past Surgical History:  Procedure Laterality Date  . back surgy  02/26/2017   lumbar 4-5  . BREAST BIOPSY Left 2003  . [place]   with bladder tack   . COLONOSCOPY  07/2014   negative; Dr Olevia Perches  . FOOT SURGERY Left 2014    Social History   Socioeconomic History  . Marital status: Married    Spouse name: Not on file  . Number of children: 2  . Years of education: Not on file  . Highest education level: Not on file  Occupational History  . Occupation: retired  Tobacco Use  . Smoking status: Never Smoker  . Smokeless tobacco: Never Used  Substance and Sexual Activity  . Alcohol use: No  . Drug use: No  . Sexual activity: Yes    Birth control/protection: Post-menopausal  Other Topics Concern  . Not on file  Social History Narrative   Regular exercise- yes: walks, but not as much due to knee arthritis.  Does stretching   Lives with husband.         Social Determinants of Health   Financial Resource Strain:   . Difficulty of Paying Living Expenses: Not on file  Food Insecurity:   . Worried About Charity fundraiser in the Last Year: Not on file  . Ran Out of Food in the Last Year: Not on file  Transportation Needs:   . Lack of Transportation (Medical): Not on file  . Lack of Transportation (Non-Medical): Not on file  Physical Activity:   . Days of Exercise per Week: Not on file  . Minutes of  Exercise per Session: Not on file  Stress:   . Feeling of Stress : Not on file  Social Connections:   . Frequency of Communication with Friends and Family: Not on file  . Frequency of Social Gatherings with Friends and Family: Not on file  . Attends Religious Services: Not on file  . Active Member of Clubs or Organizations: Not on file  . Attends Archivist Meetings: Not on file  . Marital Status: Not on file  Family History  Problem Relation Age of Onset  . Heart attack Father 30       Died with multiple medical problems  . Colon cancer Father 76  . Diabetes Father   . Tuberculosis Paternal Grandmother   . Stroke Mother 86  . Hypothyroidism Mother   . Hypertension Mother   . Hypothyroidism Sister   . Hypertension Sister   . Breast cancer Sister   . Diabetes Sister   . Heart attack Sister        2 sisters > 81    Review of Systems  Constitutional: Negative for chills and fever.  Respiratory: Positive for cough (chronic - allergies this time of year). Negative for shortness of breath and wheezing.   Cardiovascular: Positive for palpitations (two nights when going to sleep - related to increased anxiety). Negative for chest pain and leg swelling.  Neurological: Positive for headaches (occ). Negative for light-headedness.       Objective:   Vitals:   09/10/19 0900  BP: 132/78  Pulse: 78  Temp: 97.8 F (36.6 C)  SpO2: 96%   BP Readings from Last 3 Encounters:  09/10/19 132/78  06/10/19 132/76  03/10/19 136/84   Wt Readings from Last 3 Encounters:  09/10/19 143 lb 3.2 oz (65 kg)  06/10/19 134 lb (60.8 kg)  03/10/19 137 lb (62.1 kg)   Body mass index is 26.19 kg/m.   Physical Exam    Constitutional: Appears well-developed and well-nourished. No distress.  HENT:  Head: Normocephalic and atraumatic.  Neck: Neck supple. No tracheal deviation present. No thyromegaly present.  No cervical lymphadenopathy Cardiovascular: Normal rate, regular rhythm  and normal heart sounds.  2/6 systolic murmur heard. No carotid bruit .  No edema Pulmonary/Chest: Effort normal and breath sounds normal. No respiratory distress. No has no wheezes. No rales.  Skin: Skin is warm and dry. Not diaphoretic.  Psychiatric: Normal mood and affect. Behavior is normal.      Assessment & Plan:    See Problem List for Assessment and Plan of chronic medical problems.    This visit occurred during the SARS-CoV-2 public health emergency.  Safety protocols were in place, including screening questions prior to the visit, additional usage of staff PPE, and extensive cleaning of exam room while observing appropriate contact time as indicated for disinfecting solutions.

## 2019-09-09 NOTE — Patient Instructions (Addendum)
  Tests ordered today. Your results will be released to MyChart (or called to you) after review.  If any changes need to be made, you will be notified at that same time.    Medications reviewed and updated.  Changes include :   none     Please followup in 6 months   

## 2019-09-10 ENCOUNTER — Ambulatory Visit (INDEPENDENT_AMBULATORY_CARE_PROVIDER_SITE_OTHER): Payer: PPO | Admitting: Internal Medicine

## 2019-09-10 ENCOUNTER — Other Ambulatory Visit: Payer: Self-pay

## 2019-09-10 ENCOUNTER — Other Ambulatory Visit (INDEPENDENT_AMBULATORY_CARE_PROVIDER_SITE_OTHER): Payer: PPO

## 2019-09-10 ENCOUNTER — Encounter: Payer: Self-pay | Admitting: Internal Medicine

## 2019-09-10 VITALS — BP 132/78 | HR 78 | Temp 97.8°F | Ht 62.0 in | Wt 143.2 lb

## 2019-09-10 DIAGNOSIS — R7989 Other specified abnormal findings of blood chemistry: Secondary | ICD-10-CM | POA: Diagnosis not present

## 2019-09-10 DIAGNOSIS — M810 Age-related osteoporosis without current pathological fracture: Secondary | ICD-10-CM | POA: Diagnosis not present

## 2019-09-10 DIAGNOSIS — I10 Essential (primary) hypertension: Secondary | ICD-10-CM

## 2019-09-10 DIAGNOSIS — R739 Hyperglycemia, unspecified: Secondary | ICD-10-CM

## 2019-09-10 DIAGNOSIS — E7849 Other hyperlipidemia: Secondary | ICD-10-CM

## 2019-09-10 LAB — HEMOGLOBIN A1C: Hgb A1c MFr Bld: 5.5 % (ref 4.6–6.5)

## 2019-09-10 LAB — CBC WITH DIFFERENTIAL/PLATELET
Basophils Absolute: 0 10*3/uL (ref 0.0–0.1)
Basophils Relative: 0.6 % (ref 0.0–3.0)
Eosinophils Absolute: 0.1 10*3/uL (ref 0.0–0.7)
Eosinophils Relative: 2.1 % (ref 0.0–5.0)
HCT: 39 % (ref 36.0–46.0)
Hemoglobin: 13.1 g/dL (ref 12.0–15.0)
Lymphocytes Relative: 23 % (ref 12.0–46.0)
Lymphs Abs: 1.4 10*3/uL (ref 0.7–4.0)
MCHC: 33.7 g/dL (ref 30.0–36.0)
MCV: 91.2 fl (ref 78.0–100.0)
Monocytes Absolute: 0.4 10*3/uL (ref 0.1–1.0)
Monocytes Relative: 6.8 % (ref 3.0–12.0)
Neutro Abs: 4.2 10*3/uL (ref 1.4–7.7)
Neutrophils Relative %: 67.5 % (ref 43.0–77.0)
Platelets: 231 10*3/uL (ref 150.0–400.0)
RBC: 4.27 Mil/uL (ref 3.87–5.11)
RDW: 13.6 % (ref 11.5–15.5)
WBC: 6.2 10*3/uL (ref 4.0–10.5)

## 2019-09-10 LAB — COMPREHENSIVE METABOLIC PANEL
ALT: 19 U/L (ref 0–35)
AST: 20 U/L (ref 0–37)
Albumin: 4.4 g/dL (ref 3.5–5.2)
Alkaline Phosphatase: 48 U/L (ref 39–117)
BUN: 16 mg/dL (ref 6–23)
CO2: 30 mEq/L (ref 19–32)
Calcium: 9.7 mg/dL (ref 8.4–10.5)
Chloride: 103 mEq/L (ref 96–112)
Creatinine, Ser: 0.76 mg/dL (ref 0.40–1.20)
GFR: 74.61 mL/min (ref >60.00)
Glucose, Bld: 93 mg/dL (ref 70–99)
Potassium: 4.1 mEq/L (ref 3.5–5.1)
Sodium: 139 mEq/L (ref 135–145)
Total Bilirubin: 0.4 mg/dL (ref 0.2–1.2)
Total Protein: 6.8 g/dL (ref 6.0–8.3)

## 2019-09-10 LAB — TSH: TSH: 3.38 u[IU]/mL (ref 0.35–4.50)

## 2019-09-10 LAB — LIPID PANEL
Cholesterol: 255 mg/dL — ABNORMAL HIGH (ref 0–200)
HDL: 88.6 mg/dL (ref >39.00)
LDL Cholesterol: 152 mg/dL — ABNORMAL HIGH (ref 0–99)
NonHDL: 166.77
Total CHOL/HDL Ratio: 3
Triglycerides: 74 mg/dL (ref 0.0–149.0)
VLDL: 14.8 mg/dL (ref 0.0–40.0)

## 2019-09-10 MED ORDER — LOSARTAN POTASSIUM 50 MG PO TABS: 50.0000 mg | ORAL_TABLET | Freq: Every day | ORAL | 1 refills | Status: DC

## 2019-09-10 MED ORDER — AMLODIPINE BESYLATE 2.5 MG PO TABS: 2.5000 mg | ORAL_TABLET | Freq: Every day | ORAL | 1 refills | Status: DC

## 2019-09-10 NOTE — Assessment & Plan Note (Signed)
a1c

## 2019-09-10 NOTE — Assessment & Plan Note (Signed)
Check lipid panel  diet controlled  Regular exercise and healthy diet encouraged

## 2019-09-10 NOTE — Assessment & Plan Note (Signed)
Clinically euthyroid Check tsh

## 2019-09-10 NOTE — Assessment & Plan Note (Signed)
BP well controlled Current regimen effective and well tolerated Continue current medications at current doses cmp  

## 2019-09-10 NOTE — Assessment & Plan Note (Signed)
Deferred dexa - does not want any medication for OP Taking calcium and vitamin d Not exercising - encouraged regular exercise

## 2019-09-30 ENCOUNTER — Encounter: Payer: Self-pay | Admitting: Internal Medicine

## 2019-10-01 DIAGNOSIS — L821 Other seborrheic keratosis: Secondary | ICD-10-CM | POA: Diagnosis not present

## 2019-10-01 DIAGNOSIS — Z419 Encounter for procedure for purposes other than remedying health state, unspecified: Secondary | ICD-10-CM | POA: Diagnosis not present

## 2019-10-01 DIAGNOSIS — Z85828 Personal history of other malignant neoplasm of skin: Secondary | ICD-10-CM | POA: Diagnosis not present

## 2019-10-23 ENCOUNTER — Encounter: Payer: Self-pay | Admitting: Internal Medicine

## 2019-11-30 ENCOUNTER — Encounter: Payer: Self-pay | Admitting: Internal Medicine

## 2019-12-14 ENCOUNTER — Encounter: Payer: Self-pay | Admitting: Internal Medicine

## 2019-12-14 ENCOUNTER — Ambulatory Visit (INDEPENDENT_AMBULATORY_CARE_PROVIDER_SITE_OTHER): Payer: PPO | Admitting: Internal Medicine

## 2019-12-14 DIAGNOSIS — J0100 Acute maxillary sinusitis, unspecified: Secondary | ICD-10-CM | POA: Diagnosis not present

## 2019-12-14 MED ORDER — AMOXICILLIN-POT CLAVULANATE 875-125 MG PO TABS: 1.0000 | ORAL_TABLET | Freq: Two times a day (BID) | ORAL | 0 refills | Status: DC

## 2019-12-14 NOTE — Progress Notes (Signed)
Virtual Visit via Video Note  I connected with [name] on 12/14/19 at  3:15 PM EDT by a video enabled telemedicine application and verified that I am speaking with the correct person using two identifiers.   I discussed the limitations of evaluation and management by telemedicine and the availability of in person appointments. The patient expressed understanding and agreed to proceed.  Present for the visit:  Myself, Dr Billey Gosling, Carloyn Jaeger.  The patient is currently at home and I am in the office.    No referring provider.    History of Present Illness: She is here for an acute visit for cold symptoms.   Her symptoms started several days ago.  Her husband had similar symptoms.  He was tested for covid and it was negative.  He had a return and was started on an antibiotic.  She is experiencing nasal congestion, ear pain for 1 day only, sinus pain, sore throat that has resolved, cough that is productive, some wheeze and headaches.  She has tried taking mucinex, tussin DM    Review of Systems  Constitutional: Negative for fever.  HENT: Positive for congestion, ear pain (one day), sinus pain and sore throat (resolved).   Respiratory: Positive for cough, sputum production and wheezing. Negative for shortness of breath.   Gastrointestinal: Negative for diarrhea.  Neurological: Positive for headaches.      Social History   Socioeconomic History  . Marital status: Married    Spouse name: Not on file  . Number of children: 2  . Years of education: Not on file  . Highest education level: Not on file  Occupational History  . Occupation: retired  Tobacco Use  . Smoking status: Never Smoker  . Smokeless tobacco: Never Used  Substance and Sexual Activity  . Alcohol use: No  . Drug use: No  . Sexual activity: Yes    Birth control/protection: Post-menopausal  Other Topics Concern  . Not on file  Social History Narrative   Regular exercise- yes: walks, but  not as much due to knee arthritis.  Does stretching   Lives with husband.         Social Determinants of Health   Financial Resource Strain:   . Difficulty of Paying Living Expenses:   Food Insecurity:   . Worried About Charity fundraiser in the Last Year:   . Arboriculturist in the Last Year:   Transportation Needs:   . Film/video editor (Medical):   Marland Kitchen Lack of Transportation (Non-Medical):   Physical Activity:   . Days of Exercise per Week:   . Minutes of Exercise per Session:   Stress:   . Feeling of Stress :   Social Connections:   . Frequency of Communication with Friends and Family:   . Frequency of Social Gatherings with Friends and Family:   . Attends Religious Services:   . Active Member of Clubs or Organizations:   . Attends Archivist Meetings:   Marland Kitchen Marital Status:      Observations/Objective: Appears well in NAD Coughing intermittently that sounds deep Breathing normally and speaking in full sentences Skin appears warm and dry  Assessment and Plan:  See Problem List for Assessment and Plan of chronic medical problems.   Follow Up Instructions:    I discussed the assessment and treatment plan with the patient. The patient was provided an opportunity to ask questions and all were answered. The patient agreed with the plan and  demonstrated an understanding of the instructions.   The patient was advised to call back or seek an in-person evaluation if the symptoms worsen or if the condition fails to improve as anticipated.    Binnie Rail, MD

## 2019-12-14 NOTE — Assessment & Plan Note (Signed)
Acute Symptoms consistent with probable bacterial sinus infection and possibly early bronchitis Her husband has similar symptoms and he was tested for Covid and it was negative so I doubt that she has Covid.  She is also received both of her vaccines. Start Augmentin twice daily x10 days Continue Mucinex, allergy medications and over-the-counter cough medication Rest, fluids Call if no improvement or if symptoms worsen

## 2019-12-18 ENCOUNTER — Encounter: Payer: Self-pay | Admitting: Internal Medicine

## 2019-12-19 MED ORDER — PROMETHAZINE-DM 6.25-15 MG/5ML PO SYRP: 5.0000 mL | ORAL_SOLUTION | Freq: Four times a day (QID) | ORAL | 0 refills | Status: DC | PRN

## 2019-12-29 ENCOUNTER — Ambulatory Visit (INDEPENDENT_AMBULATORY_CARE_PROVIDER_SITE_OTHER): Payer: PPO | Admitting: Internal Medicine

## 2019-12-29 ENCOUNTER — Encounter: Payer: Self-pay | Admitting: Internal Medicine

## 2019-12-29 DIAGNOSIS — J4521 Mild intermittent asthma with (acute) exacerbation: Secondary | ICD-10-CM | POA: Diagnosis not present

## 2019-12-29 DIAGNOSIS — J45909 Unspecified asthma, uncomplicated: Secondary | ICD-10-CM | POA: Insufficient documentation

## 2019-12-29 MED ORDER — DOXYCYCLINE HYCLATE 100 MG PO TABS: 100.0000 mg | ORAL_TABLET | Freq: Two times a day (BID) | ORAL | 0 refills | Status: DC

## 2019-12-29 MED ORDER — METHYLPREDNISOLONE 4 MG PO TBPK: ORAL_TABLET | ORAL | 0 refills | Status: DC

## 2019-12-29 NOTE — Assessment & Plan Note (Signed)
Acute Symptoms consistent with mild reactive airway disease-persistent cough ?  Residual infection that has not been successfully treated Doxycycline twice daily x1 week Medrol Dosepak Can continue Mucinex She will call if her symptoms do not improve

## 2019-12-29 NOTE — Progress Notes (Signed)
Virtual Visit via Video Note  I connected with Heather Ramsey on 12/29/19 at 10:45 AM EDT by a video enabled telemedicine application and verified that I am speaking with the correct person using two identifiers.   I discussed the limitations of evaluation and management by telemedicine and the availability of in person appointments. The patient expressed understanding and agreed to proceed.  Present for the visit:  Myself, Dr Billey Gosling, Carloyn Jaeger.  The patient is currently at home and I am in the office.    No referring provider.    History of Present Illness: This is an acute visit for cough, congestion.   I saw her virtually 3/22 for cold symptoms and started Augmentin.  The crackle and rattle in her chest is better. She still has a cough.  The cough is wet, but she thinks the sputum is primarily white in color.  This morning she coughed for an hour and a half - sounded like bronchitis.  Her sinus symptoms are better.    She is taking mucinex.  She has been using the cough syrup and that helps her sleep at night.  Review of Systems  Constitutional: Negative for chills and fever.  HENT: Negative for congestion, ear pain, sinus pain and sore throat.   Respiratory: Positive for cough and sputum production (white and thick). Negative for shortness of breath and wheezing.   Cardiovascular: Negative for chest pain.  Neurological: Negative for headaches.      Social History   Socioeconomic History  . Marital status: Married    Spouse name: Not on file  . Number of children: 2  . Years of education: Not on file  . Highest education level: Not on file  Occupational History  . Occupation: retired  Tobacco Use  . Smoking status: Never Smoker  . Smokeless tobacco: Never Used  Substance and Sexual Activity  . Alcohol use: No  . Drug use: No  . Sexual activity: Yes    Birth control/protection: Post-menopausal  Other Topics Concern  . Not on file  Social History  Narrative   Regular exercise- yes: walks, but not as much due to knee arthritis.  Does stretching   Lives with husband.         Social Determinants of Health   Financial Resource Strain:   . Difficulty of Paying Living Expenses:   Food Insecurity:   . Worried About Charity fundraiser in the Last Year:   . Arboriculturist in the Last Year:   Transportation Needs:   . Film/video editor (Medical):   Marland Kitchen Lack of Transportation (Non-Medical):   Physical Activity:   . Days of Exercise per Week:   . Minutes of Exercise per Session:   Stress:   . Feeling of Stress :   Social Connections:   . Frequency of Communication with Friends and Family:   . Frequency of Social Gatherings with Friends and Family:   . Attends Religious Services:   . Active Member of Clubs or Organizations:   . Attends Archivist Meetings:   Marland Kitchen Marital Status:      Observations/Objective: Appears well in NAD Breathing normally and speaking in full sentences Intermittent dry cough Skin appears warm and dry  Assessment and Plan:  See Problem List for Assessment and Plan of chronic medical problems.   Follow Up Instructions:    I discussed the assessment and treatment plan with the patient. The patient was provided an opportunity to  ask questions and all were answered. The patient agreed with the plan and demonstrated an understanding of the instructions.   The patient was advised to call back or seek an in-person evaluation if the symptoms worsen or if the condition fails to improve as anticipated.    Binnie Rail, MD

## 2020-02-04 ENCOUNTER — Ambulatory Visit (AMBULATORY_SURGERY_CENTER): Payer: Self-pay | Admitting: *Deleted

## 2020-02-04 ENCOUNTER — Other Ambulatory Visit: Payer: Self-pay

## 2020-02-04 ENCOUNTER — Encounter: Payer: Self-pay | Admitting: Gastroenterology

## 2020-02-04 VITALS — Temp 96.2°F | Ht 62.0 in | Wt 139.0 lb

## 2020-02-04 DIAGNOSIS — Z800 Family history of malignant neoplasm of digestive organs: Secondary | ICD-10-CM

## 2020-02-04 MED ORDER — SUTAB 1479-225-188 MG PO TABS: 1.0000 | ORAL_TABLET | ORAL | 0 refills | Status: DC

## 2020-02-04 NOTE — Progress Notes (Signed)
Patient is here in-person for PV. Patient denies any allergies to eggs or soy. Patient denies any problems with anesthesia/sedation. Patient denies any oxygen use at home. Patient denies taking any diet/weight loss medications or blood thinners. Patient is not being treated for MRSA or C-diff. Patient is aware of our care-partner policy and 0000000 safety protocol. Completed covid vaccines in April 2021 per pt.  Prep Prescription coupon given to the patient.

## 2020-02-14 ENCOUNTER — Other Ambulatory Visit: Payer: Self-pay | Admitting: Internal Medicine

## 2020-02-14 DIAGNOSIS — I872 Venous insufficiency (chronic) (peripheral): Secondary | ICD-10-CM

## 2020-02-18 ENCOUNTER — Other Ambulatory Visit: Payer: Self-pay

## 2020-02-18 ENCOUNTER — Ambulatory Visit (AMBULATORY_SURGERY_CENTER): Payer: PPO | Admitting: Gastroenterology

## 2020-02-18 ENCOUNTER — Encounter: Payer: Self-pay | Admitting: Gastroenterology

## 2020-02-18 VITALS — BP 98/62 | HR 55 | Temp 96.4°F | Resp 13 | Ht 62.0 in | Wt 139.0 lb

## 2020-02-18 DIAGNOSIS — Z1211 Encounter for screening for malignant neoplasm of colon: Secondary | ICD-10-CM

## 2020-02-18 DIAGNOSIS — D125 Benign neoplasm of sigmoid colon: Secondary | ICD-10-CM | POA: Diagnosis not present

## 2020-02-18 DIAGNOSIS — D122 Benign neoplasm of ascending colon: Secondary | ICD-10-CM | POA: Diagnosis not present

## 2020-02-18 DIAGNOSIS — Z800 Family history of malignant neoplasm of digestive organs: Secondary | ICD-10-CM | POA: Diagnosis not present

## 2020-02-18 DIAGNOSIS — D123 Benign neoplasm of transverse colon: Secondary | ICD-10-CM

## 2020-02-18 MED ORDER — SODIUM CHLORIDE 0.9 % IV SOLN
500.0000 mL | Freq: Once | INTRAVENOUS | Status: DC
Start: 2020-02-18 — End: 2020-02-18

## 2020-02-18 NOTE — Patient Instructions (Addendum)
Handouts :  Polyps, diverticulosis, Hemorrhoids Await pathology results Continue present medications Repeat colonoscopy  5 years   YOU HAD AN ENDOSCOPIC PROCEDURE TODAY AT [facility]:   Refer to the procedure report that was given to you for any specific questions about what was found during the examination.  If the procedure report does not answer your questions, please call your gastroenterologist to clarify.  If you requested that your care partner not be given the details of your procedure findings, then the procedure report has been included in a sealed envelope for you to review at your convenience later.  YOU SHOULD EXPECT: Some feelings of bloating in the abdomen. Passage of more gas than usual.  Walking can help get rid of the air that was put into your GI tract during the procedure and reduce the bloating. If you had a lower endoscopy (such as a colonoscopy or flexible sigmoidoscopy) you may notice spotting of blood in your stool or on the toilet paper. If you underwent a bowel prep for your procedure, you may not have a normal bowel movement for a few days.  Please Note:  You might notice some irritation and congestion in your nose or some drainage.  This is from the oxygen used during your procedure.  There is no need for concern and it should clear up in a day or so.  SYMPTOMS TO REPORT IMMEDIATELY:   Following lower endoscopy (colonoscopy or flexible sigmoidoscopy):  Excessive amounts of blood in the stool  Significant tenderness or worsening of abdominal pains  Swelling of the abdomen that is new, acute  Fever of 100F or higher   For urgent or emergent issues, a gastroenterologist can be reached at any hour by calling [phone]. Do not use MyChart messaging for urgent concerns.    DIET:  We do recommend a small meal at first, but then you may proceed to your regular diet.  Drink plenty of fluids but you should avoid alcoholic beverages for 24  hours.  ACTIVITY:  You should plan to take it easy for the rest of today and you should NOT DRIVE or use heavy machinery until tomorrow (because of the sedation medicines used during the test).    FOLLOW UP: Our staff will call the number listed on your records 48-72 hours following your procedure to check on you and address any questions or concerns that you may have regarding the information given to you following your procedure. If we do not reach you, we will leave a message.  We will attempt to reach you two times.  During this call, we will ask if you have developed any symptoms of COVID 19. If you develop any symptoms (ie: fever, flu-like symptoms, shortness of breath, cough etc.) before then, please call [phone].  If you test positive for Covid 19 in the 2 weeks post procedure, please call and report this information to [place].    If any biopsies were taken you will be contacted by phone or by letter within the next 1-3 weeks.  Please call us at [phone] if you have not heard about the biopsies in 3 weeks.    SIGNATURES/CONFIDENTIALITY: You and/or your care partner have signed paperwork which will be entered into your electronic medical record.  These signatures attest to the fact that that the information above on your After Visit Summary has been reviewed and is understood.  Full responsibility of the confidentiality of this discharge information lies with you and/or your  care-partner.

## 2020-02-18 NOTE — Progress Notes (Signed)
VS by CW  Pt's states no medical or surgical changes since previsit or office visit.  

## 2020-02-18 NOTE — Op Note (Addendum)
[place] Patient Name: Heather Ramsey Procedure Date: 02/18/2020 8:24 AM MRN: [id number] Endoscopist: Thornton Park MD, MD Age: 74 Referring MD:  Date of Birth: Aug 03, 1946 Gender: Female Account #: [id number] Procedure:                Colonoscopy Indications:              Screening in patient at increased risk: Family                            history of 1st-degree relative with colorectal                            cancer                           Father with colon cancer in his 40s                           Prior colonoscopy with Dr. Olevia Perches including                            procedureds in 1994, 2000, 20004, 2010, 2015. No                            polyps identified on prior colonoscopies. Medicines:                Monitored Anesthesia Care Procedure:                Pre-Anesthesia Assessment:                           - Prior to the procedure, a History and Physical                            was performed, and patient medications and                            allergies were reviewed. The patient's tolerance of                            previous anesthesia was also reviewed. The risks                            and benefits of the procedure and the sedation                            options and risks were discussed with the patient.                            All questions were answered, and informed consent                            was obtained. Prior Anticoagulants: The patient has  taken no previous anticoagulant or antiplatelet                            agents. ASA Grade Assessment: II - A patient with                            mild systemic disease. After reviewing the risks                            and benefits, the patient was deemed in                            satisfactory condition to undergo the procedure.                           After obtaining informed consent, the colonoscope                            was  passed under direct vision. Throughout the                            procedure, the patient's blood pressure, pulse, and                            oxygen saturations were monitored continuously. The                            Colonoscope was introduced through the anus and                            advanced to the 3 cm into the ileum. A second                            forward view of the right colon was performed. The                            colonoscopy was performed without difficulty. The                            patient tolerated the procedure well. The quality                            of the bowel preparation was good. The terminal                            ileum, ileocecal valve, appendiceal orifice, and                            rectum were photographed. Scope In: 8:31:50 AM Scope Out: 8:50:26 AM Scope Withdrawal Time: 0 hours 12 minutes 17 seconds  Total Procedure Duration: 0 hours 18 minutes 36 seconds  Findings:                 Non-bleeding external and internal hemorrhoids were  found.                           A few small and large-mouthed diverticula were                            found in the sigmoid colon and descending colon.                           A 2 mm polyp was found in the sigmoid colon. The                            polyp was flat. The polyp was removed with a cold                            snare. Resection and retrieval were complete.                            Estimated blood loss was minimal.                           A 2 mm polyp was found in the ascending colon. The                            polyp was sessile. The polyp was removed with a                            cold snare. Resection and retrieval were complete.                            Estimated blood loss was minimal.                           The exam was otherwise without abnormality on                            direct and retroflexion  views. Complications:            No immediate complications. Estimated blood loss:                            Minimal. Estimated Blood Loss:     Estimated blood loss was minimal. Impression:               - Non-bleeding external and internal hemorrhoids.                           - Diverticulosis in the sigmoid colon and in the                            descending colon.                           - One 2 mm polyp in the sigmoid colon, removed with  a cold snare. Resected and retrieved.                           - One 2 mm polyp in the ascending colon, removed                            with a cold snare. Resected and retrieved.                           - The examination was otherwise normal on direct                            and retroflexion views. Recommendation:           - Patient has a contact number available for                            emergencies. The signs and symptoms of potential                            delayed complications were discussed with the                            patient. Return to normal activities tomorrow.                            Written discharge instructions were provided to the                            patient.                           - Follow a high fiber diet. Drink at least 64                            ounces of water daily. Add a daily stool bulking                            agent such as psyllium (an exampled would be                            Metamucil).                           - Continue present medications.                           - Await pathology results.                           - Repeat colonoscopy in 5 years for surveillance if                            clinically appropriate at that time.                           -  Emerging evidence supports eating a diet of                            fruits, vegetables, grains, calcium, and yogurt                            while reducing red meat and alcohol  may reduce the                            risk of colon cancer.                           - Thank you for allowing me to be involved in your                            colon cancer prevention. Thornton Park MD, MD 02/18/2020 8:57:02 AM This report has been signed electronically.

## 2020-02-18 NOTE — Progress Notes (Signed)
To PACU, VSS. Report to [email or web address] 

## 2020-02-23 ENCOUNTER — Encounter: Payer: Self-pay | Admitting: Gastroenterology

## 2020-02-23 ENCOUNTER — Telehealth: Payer: Self-pay

## 2020-02-23 NOTE — Telephone Encounter (Signed)
  Follow up Call-  Call back number 02/18/2020  Post procedure Call Back phone  # [phone]  Permission to leave phone message Yes  Some recent data might be hidden     Patient questions:  Do you have a fever, pain , or abdominal swelling? No. Pain Score  0 *  Have you tolerated food without any problems? Yes.    Have you been able to return to your normal activities? Yes.    Do you have any questions about your discharge instructions: Diet   No. Medications  No. Follow up visit  No.  Do you have questions or concerns about your Care? No.  Actions: * If pain score is 4 or above: No action needed, pain <4.  1. Have you developed a fever since your procedure? no  2.   Have you had an respiratory symptoms (SOB or cough) since your procedure? no  3.   Have you tested positive for COVID 19 since your procedure no  4.   Have you had any family members/close contacts diagnosed with the COVID 19 since your procedure?  no   If yes to any of these questions please route to Joylene John, RN and Erenest Rasher, RN

## 2020-03-10 ENCOUNTER — Encounter: Payer: PPO | Admitting: Internal Medicine

## 2020-03-14 ENCOUNTER — Encounter: Payer: PPO | Admitting: Internal Medicine

## 2020-03-21 NOTE — Progress Notes (Signed)
Subjective:    Patient ID: Heather Ramsey, female    DOB: 10-Dec-1945, 74 y.o.   MRN: [id number]  HPI She is here for a physical exam.   Her eyebrows are more sparse.  She has pain in her right medial eyebrow - it hurts more to touch that.  She had a headache there yesterday.   She has had a cough for 9 months.  It is a deep dry cough.  No drainage or GERD.  No pattern to the cough.  Nothing makes it better / worse.  It has not changed - not worse.    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  This is new since getting the covid vaccine.  She also states fatigue and she naps daily.  She thinks it may be related to the vaccine.     Medications and allergies reviewed with patient and updated if appropriate.  Patient Active Problem List   Diagnosis Date Noted  . RAD (reactive airway disease) 12/29/2019  . Elevated TSH 09/09/2019  . Cardiac murmur 01/07/2018  . Osteoporosis 08/18/2017  . Hyperglycemia 07/30/2017  . Venous stasis dermatitis of both lower extremities 04/09/2017  . Spondylolisthesis at L4-L5 level 02/26/2017  . Fibromyalgia 01/03/2017  . Primary osteoarthritis of both hands 01/03/2017  . Trochanteric bursitis of both hips 01/03/2017  . Hypertension 12/29/2016  . 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 Lumbar radiculopathy 09/04/2016  . 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 Abnormal EKG 02/05/2012  . Atrophic vaginitis   . Basal cell cancer 04/24/2011  . DIVERTICULOSIS, COLON 06/09/2009  . Hyperlipidemia 05/31/2008  . IRRITABLE BOWEL SYNDROME 05/27/2008  . Chronic fatigue syndrome 05/27/2008    Current Outpatient Medications on File Prior to Visit  Medication Sig Dispense Refill  . amLODipine (NORVASC) 2.5 MG tablet Take 1 tablet (2.5 mg total) by mouth daily. 90 tablet 1  . b complex vitamins tablet Take 1 tablet by mouth daily.      . Calcium Carbonate-Vitamin D (CALCIUM + D PO) Take 1  tablet by mouth 2 (two) times daily.     . Dietary Management Product (VASCULERA) TABS TAKE ONE TABLET BY MOUTH EVERY DAY 90 tablet 1  . Docosahexaenoic Acid (DHA ALGAL-900 PO) Take 900 mg by mouth daily.    . fish oil-omega-3 fatty acids 1000 MG capsule Take 1-2 g by mouth See admin instructions. 1 cap in the morning, 2 caps in the evening    . Ginger, Zingiber officinalis, (GINGER ROOT) 550 MG CAPS Take 1 capsule by mouth daily.     Marland Kitchen losartan (COZAAR) 50 MG tablet Take 1 tablet (50 mg total) by mouth daily. 90 tablet 1  . magnesium 30 MG tablet Take 30 mg by mouth 2 (two) times daily.      . Misc Natural Products (TART CHERRY ADVANCED PO) Take by mouth daily.    . Multiple Vitamins-Minerals (ICAPS AREDS 2 PO) Take 1 capsule by mouth daily.    . Probiotic Product (PROBIOTIC-10 PO) Take by mouth daily.    . Psyllium (METAMUCIL) 28.3 % POWD Take 1 scoop dissolved in at least 8 ounces water/juice and drink twice daily (Patient taking differently: as needed. Take 1 scoop dissolved in at least 8 ounces water/juice and drink twice daily) 1 Bottle 0  . TURMERIC PO Take 400 mg by mouth daily.     No current facility-administered medications on file prior to visit.  Past Medical History:  Diagnosis Date  . Allergy   . Atrophic vaginitis   . Chest pain    negative  cardiac evaluation; Dr Percival Spanish  . Diverticulosis of colon 2005& 2010   FH colon cancer  . Fibromyalgia    Dr Estanislado Pandy  . GERD (gastroesophageal reflux disease)   . Heart murmur   . Hemorrhoid   . Hyperlipidemia   . Hypertension   . IBS (irritable bowel syndrome)   . Migraine   . MVP (mitral valve prolapse)   . Osteopenia   . Pulmonary nodule 07/2006  . UTI (lower urinary tract infection) 2005   Citrobacter koseri, hospitalized w/ kidney infection 1983    Past Surgical History:  Procedure Laterality Date  . back surgy  02/26/2017   lumbar 4-5  . BREAST BIOPSY Left 2003  . [place]   with bladder tack     . CATARACT EXTRACTION, BILATERAL  05/2019  . COLONOSCOPY  07/2014   negative; Dr Olevia Perches  . FOOT SURGERY Left 2014    Social History   Socioeconomic History  . Marital status: Married    Spouse name: Not on file  . Number of children: 2  . Years of education: Not on file  . Highest education level: Not on file  Occupational History  . Occupation: retired  Tobacco Use  . Smoking status: Never Smoker  . Smokeless tobacco: Never Used  Vaping Use  . Vaping Use: Never used  Substance and Sexual Activity  . Alcohol use: No  . Drug use: No  . Sexual activity: Yes    Birth control/protection: Post-menopausal  Other Topics Concern  . Not on file  Social History Narrative   Regular exercise- yes: walks, but not as much due to knee arthritis.  Does stretching   Lives with husband.         Social Determinants of Health   Financial Resource Strain:   . Difficulty of Paying Living Expenses:   Food Insecurity:   . Worried About Charity fundraiser in the Last Year:   . Arboriculturist in the Last Year:   Transportation Needs:   . Film/video editor (Medical):   Marland Kitchen Lack of Transportation (Non-Medical):   Physical Activity:   . Days of Exercise per Week:   . Minutes of Exercise per Session:   Stress:   . Feeling of Stress :   Social Connections:   . Frequency of Communication with Friends and Family:   . Frequency of Social Gatherings with Friends and Family:   . Attends Religious Services:   . Active Member of Clubs or Organizations:   . Attends Archivist Meetings:   Marland Kitchen Marital Status:     Family History  Problem Relation Age of Onset  . Heart attack Father 69       Died with multiple medical problems  . Colon cancer Father 52  . Diabetes Father   . Tuberculosis Paternal Grandmother   . Stroke Mother 35  . Hypothyroidism Mother   . Hypertension Mother   . Hypothyroidism Sister   . Hypertension Sister   . Breast cancer Sister   . Diabetes Sister    . Heart attack Sister        2 sisters > 41  . Colon polyps Neg Hx   . Esophageal cancer Neg Hx   . Rectal cancer Neg Hx   . Stomach cancer Neg Hx     Review  of Systems  Constitutional: Negative for chills and fever.  HENT: Negative for postnasal drip.   Eyes: Negative for visual disturbance.  Respiratory: Positive for cough (dry). Negative for chest tightness, shortness of breath and wheezing.   Cardiovascular: Positive for leg swelling (chronic right ankle swelling at night). Negative for chest pain and palpitations.  Gastrointestinal: Positive for anal bleeding (hemorrhoidal). Negative for abdominal pain, blood in stool, constipation, diarrhea and nausea.       No gerd  Genitourinary: Negative for dysuria and hematuria.  Musculoskeletal: Positive for back pain (chronic).  Skin: Negative for rash.  Neurological: Positive for light-headedness (occ when she bends over). Negative for headaches.  Psychiatric/Behavioral: Positive for dysphoric mood (mild). The patient is not nervous/anxious.        Objective:   Vitals:   03/22/20 0945  BP: 128/72  Pulse: 65  Temp: 98.2 F (36.8 C)  SpO2: 97%   Filed Weights   03/22/20 0945  Weight: 139 lb 12.8 oz (63.4 kg)   Body mass index is 25.57 kg/m.  BP Readings from Last 3 Encounters:  03/22/20 128/72  02/18/20 98/62  09/10/19 132/78    Wt Readings from Last 3 Encounters:  03/22/20 139 lb 12.8 oz (63.4 kg)  02/18/20 139 lb (63 kg)  02/04/20 139 lb (63 kg)     Physical Exam Constitutional: She appears well-developed and well-nourished. No distress.  HENT:  Head: Normocephalic and atraumatic.  Right Ear: External ear normal. Normal ear canal and TM Left Ear: External ear normal.  Normal ear canal and TM Mouth/Throat: Oropharynx is clear and moist.  Eyes: Conjunctivae and EOM are normal.  Neck: Neck supple. No tracheal deviation present. No thyromegaly present.  No carotid bruit  Cardiovascular: Normal rate, regular  rhythm and normal heart sounds.   2/6 sys murmur heard.  No edema. Pulmonary/Chest: Effort normal and breath sounds normal. No respiratory distress. She has no wheezes. She has no rales.  Breast: deferred   Abdominal: Soft. She exhibits no distension. There is no tenderness.  Lymphadenopathy: She has no cervical adenopathy.  Skin: Skin is warm and dry. She is not diaphoretic.  Psychiatric: She has a normal mood and affect. Her behavior is normal.        Assessment & Plan:   Physical exam: Screening blood work    ordered Immunizations did Complete covid, tdap due, others up to date Colonoscopy  Up to date  Mammogram   Up to date  Gyn   - n/a Dexa    Due -- deferred Eye exams  Up to date  Exercise   Walking some - not regular Weight  normal Substance abuse   none  See Problem List for Assessment and Plan of chronic medical problems.   This visit occurred during the SARS-CoV-2 public health emergency.  Safety protocols were in place, including screening questions prior to the visit, additional usage of staff PPE, and extensive cleaning of exam room while observing appropriate contact time as indicated for disinfecting solutions.

## 2020-03-21 NOTE — Patient Instructions (Addendum)
Blood work was ordered.   A chest xray was ordered.    All other Health Maintenance issues reviewed.   All recommended immunizations and age-appropriate screenings are up-to-date or discussed.  No immunization administered today.   Medications reviewed and updated.  Changes include :   none     Please followup in 6 months    Health Maintenance, Female Adopting a healthy lifestyle and getting preventive care are important in promoting health and wellness. Ask your health care provider about:  The right schedule for you to have regular tests and exams.  Things you can do on your own to prevent diseases and keep yourself healthy. What should I know about diet, weight, and exercise? Eat a healthy diet   Eat a diet that includes plenty of vegetables, fruits, low-fat dairy products, and lean protein.  Do not eat a lot of foods that are high in solid fats, added sugars, or sodium. Maintain a healthy weight Body mass index (BMI) is used to identify weight problems. It estimates body fat based on height and weight. Your health care provider can help determine your BMI and help you achieve or maintain a healthy weight. Get regular exercise Get regular exercise. This is one of the most important things you can do for your health. Most adults should:  Exercise for at least 150 minutes each week. The exercise should increase your heart rate and make you sweat (moderate-intensity exercise).  Do strengthening exercises at least twice a week. This is in addition to the moderate-intensity exercise.  Spend less time sitting. Even light physical activity can be beneficial. Watch cholesterol and blood lipids Have your blood tested for lipids and cholesterol at 73 years of age, then have this test every 5 years. Have your cholesterol levels checked more often if:  Your lipid or cholesterol levels are high.  You are older than 73 years of age.  You are at high risk for heart disease. What  should I know about cancer screening? Depending on your health history and family history, you may need to have cancer screening at various ages. This may include screening for:  Breast cancer.  Cervical cancer.  Colorectal cancer.  Skin cancer.  Lung cancer. What should I know about heart disease, diabetes, and high blood pressure? Blood pressure and heart disease  High blood pressure causes heart disease and increases the risk of stroke. This is more likely to develop in people who have high blood pressure readings, are of African descent, or are overweight.  Have your blood pressure checked: ? Every 3-5 years if you are 45-28 years of age. ? Every year if you are 26 years old or older. Diabetes Have regular diabetes screenings. This checks your fasting blood sugar level. Have the screening done:  Once every three years after age 5 if you are at a normal weight and have a low risk for diabetes.  More often and at a younger age if you are overweight or have a high risk for diabetes. What should I know about preventing infection? Hepatitis B If you have a higher risk for hepatitis B, you should be screened for this virus. Talk with your health care provider to find out if you are at risk for hepatitis B infection. Hepatitis C Testing is recommended for:  Everyone born from [birth year] through 1964.  Anyone with known risk factors for hepatitis C. Sexually transmitted infections (STIs)  Get screened for STIs, including gonorrhea and chlamydia, if: ? You are sexually  active and are younger than 73 years of age. ? You are older than 73 years of age and your health care provider tells you that you are at risk for this type of infection. ? Your sexual activity has changed since you were last screened, and you are at increased risk for chlamydia or gonorrhea. Ask your health care provider if you are at risk.  Ask your health care provider about whether you are at high risk for HIV. Your  health care provider may recommend a prescription medicine to help prevent HIV infection. If you choose to take medicine to prevent HIV, you should first get tested for HIV. You should then be tested every 3 months for as long as you are taking the medicine. Pregnancy  If you are about to stop having your period (premenopausal) and you may become pregnant, seek counseling before you get pregnant.  Take 400 to 800 micrograms (mcg) of folic acid every day if you become pregnant.  Ask for birth control (contraception) if you want to prevent pregnancy. Osteoporosis and menopause Osteoporosis is a disease in which the bones lose minerals and strength with aging. This can result in bone fractures. If you are 21 years old or older, or if you are at risk for osteoporosis and fractures, ask your health care provider if you should:  Be screened for bone loss.  Take a calcium or vitamin D supplement to lower your risk of fractures.  Be given hormone replacement therapy (HRT) to treat symptoms of menopause. Follow these instructions at home: Lifestyle  Do not use any products that contain nicotine or tobacco, such as cigarettes, e-cigarettes, and chewing tobacco. If you need help quitting, ask your health care provider.  Do not use street drugs.  Do not share needles.  Ask your health care provider for help if you need support or information about quitting drugs. Alcohol use  Do not drink alcohol if: ? Your health care provider tells you not to drink. ? You are pregnant, may be pregnant, or are planning to become pregnant.  If you drink alcohol: ? Limit how much you use to 0-1 drink a day. ? Limit intake if you are breastfeeding.  Be aware of how much alcohol is in your drink. In the [place] one drink equals one 12 oz bottle of beer (355 mL), one 5 oz glass of wine (148 mL), or one 1 oz glass of hard liquor (44 mL). General instructions  Schedule regular health, dental, and eye  exams.  Stay current with your vaccines.  Tell your health care provider if: ? You often feel depressed. ? You have ever been abused or do not feel safe at home. Summary  Adopting a healthy lifestyle and getting preventive care are important in promoting health and wellness.  Follow your health care provider's instructions about healthy diet, exercising, and getting tested or screened for diseases.  Follow your health care provider's instructions on monitoring your cholesterol and blood pressure. This information is not intended to replace advice given to you by your health care provider. Make sure you discuss any questions you have with your health care provider. Document Revised: 09/03/2018 Document Reviewed: 09/03/2018 Elsevier Patient Education  2020 [employer].

## 2020-03-22 ENCOUNTER — Ambulatory Visit (INDEPENDENT_AMBULATORY_CARE_PROVIDER_SITE_OTHER): Payer: PPO

## 2020-03-22 ENCOUNTER — Encounter: Payer: Self-pay | Admitting: Internal Medicine

## 2020-03-22 ENCOUNTER — Other Ambulatory Visit: Payer: Self-pay

## 2020-03-22 ENCOUNTER — Ambulatory Visit (INDEPENDENT_AMBULATORY_CARE_PROVIDER_SITE_OTHER): Payer: PPO | Admitting: Internal Medicine

## 2020-03-22 VITALS — BP 128/72 | HR 65 | Temp 98.2°F | Ht 62.0 in | Wt 139.8 lb

## 2020-03-22 DIAGNOSIS — R739 Hyperglycemia, unspecified: Secondary | ICD-10-CM

## 2020-03-22 DIAGNOSIS — Z0000 Encounter for general adult medical examination without abnormal findings: Secondary | ICD-10-CM

## 2020-03-22 DIAGNOSIS — M810 Age-related osteoporosis without current pathological fracture: Secondary | ICD-10-CM | POA: Diagnosis not present

## 2020-03-22 DIAGNOSIS — R059 Cough, unspecified: Secondary | ICD-10-CM

## 2020-03-22 DIAGNOSIS — E7849 Other hyperlipidemia: Secondary | ICD-10-CM | POA: Diagnosis not present

## 2020-03-22 DIAGNOSIS — R05 Cough: Secondary | ICD-10-CM

## 2020-03-22 DIAGNOSIS — I10 Essential (primary) hypertension: Secondary | ICD-10-CM | POA: Diagnosis not present

## 2020-03-22 DIAGNOSIS — I872 Venous insufficiency (chronic) (peripheral): Secondary | ICD-10-CM | POA: Diagnosis not present

## 2020-03-22 LAB — COMPREHENSIVE METABOLIC PANEL
ALT: 19 U/L (ref 0–35)
AST: 21 U/L (ref 0–37)
Albumin: 4.6 g/dL (ref 3.5–5.2)
Alkaline Phosphatase: 47 U/L (ref 39–117)
BUN: 14 mg/dL (ref 6–23)
CO2: 27 mEq/L (ref 19–32)
Calcium: 9.4 mg/dL (ref 8.4–10.5)
Chloride: 99 mEq/L (ref 96–112)
Creatinine, Ser: 0.8 mg/dL (ref 0.40–1.20)
GFR: 70.22 mL/min (ref >60.00)
Glucose, Bld: 97 mg/dL (ref 70–99)
Potassium: 4 mEq/L (ref 3.5–5.1)
Sodium: 134 mEq/L — ABNORMAL LOW (ref 135–145)
Total Bilirubin: 0.6 mg/dL (ref 0.2–1.2)
Total Protein: 6.9 g/dL (ref 6.0–8.3)

## 2020-03-22 LAB — CBC WITH DIFFERENTIAL/PLATELET
Basophils Absolute: 0 10*3/uL (ref 0.0–0.1)
Basophils Relative: 0.6 % (ref 0.0–3.0)
Eosinophils Absolute: 0.1 10*3/uL (ref 0.0–0.7)
Eosinophils Relative: 1.4 % (ref 0.0–5.0)
HCT: 39.4 % (ref 36.0–46.0)
Hemoglobin: 13.4 g/dL (ref 12.0–15.0)
Lymphocytes Relative: 25.1 % (ref 12.0–46.0)
Lymphs Abs: 1.5 10*3/uL (ref 0.7–4.0)
MCHC: 34.1 g/dL (ref 30.0–36.0)
MCV: 90.3 fl (ref 78.0–100.0)
Monocytes Absolute: 0.4 10*3/uL (ref 0.1–1.0)
Monocytes Relative: 6.3 % (ref 3.0–12.0)
Neutro Abs: 3.9 10*3/uL (ref 1.4–7.7)
Neutrophils Relative %: 66.6 % (ref 43.0–77.0)
Platelets: 220 10*3/uL (ref 150.0–400.0)
RBC: 4.36 Mil/uL (ref 3.87–5.11)
RDW: 13.4 % (ref 11.5–15.5)
WBC: 5.9 10*3/uL (ref 4.0–10.5)

## 2020-03-22 LAB — LIPID PANEL
Cholesterol: 233 mg/dL — ABNORMAL HIGH (ref 0–200)
HDL: 80.3 mg/dL (ref >39.00)
LDL Cholesterol: 134 mg/dL — ABNORMAL HIGH (ref 0–99)
NonHDL: 153.19
Total CHOL/HDL Ratio: 3
Triglycerides: 98 mg/dL (ref 0.0–149.0)
VLDL: 19.6 mg/dL (ref 0.0–40.0)

## 2020-03-22 LAB — TSH: TSH: 2.55 u[IU]/mL (ref 0.35–4.50)

## 2020-03-22 LAB — HEMOGLOBIN A1C: Hgb A1c MFr Bld: 5.5 % (ref 4.6–6.5)

## 2020-03-22 NOTE — Assessment & Plan Note (Signed)
Chronic Taking vasculera continue

## 2020-03-22 NOTE — Assessment & Plan Note (Signed)
Chronic Has had this for > 2 years - ? Intermittent or not Denies allergies/PND and GERD CXR in past normal  -- will recheck today Consider referral to pulm - she will think about his

## 2020-03-22 NOTE — Assessment & Plan Note (Signed)
Chronic BP well controlled Current regimen effective and well tolerated Continue current medications at current doses cmp  

## 2020-03-22 NOTE — Assessment & Plan Note (Signed)
Chronic Check a1c Low sugar / carb diet Stressed regular exercise  

## 2020-03-22 NOTE — Assessment & Plan Note (Signed)
Chronic Walking some, but not enough- stressed regular exercise Deferred dexa Taking calcium and vitamin d

## 2020-03-22 NOTE — Assessment & Plan Note (Signed)
Chronic Check lipid panel  Diet controlled Regular exercise and healthy diet encouraged

## 2020-04-01 ENCOUNTER — Other Ambulatory Visit: Payer: Self-pay

## 2020-04-01 ENCOUNTER — Telehealth: Payer: Self-pay | Admitting: Internal Medicine

## 2020-04-01 MED ORDER — LOSARTAN POTASSIUM 50 MG PO TABS: 50.0000 mg | ORAL_TABLET | Freq: Every day | ORAL | 1 refills | Status: DC

## 2020-04-01 MED ORDER — AMLODIPINE BESYLATE 2.5 MG PO TABS: 2.5000 mg | ORAL_TABLET | Freq: Every day | ORAL | 1 refills | Status: DC

## 2020-04-01 NOTE — Telephone Encounter (Signed)
Sent in today 

## 2020-04-01 NOTE — Telephone Encounter (Signed)
New message:   1.Medication Requested: amLODipine (NORVASC) 2.5 MG tablet losartan (COZAAR) 50 MG tablet 2. Pharmacy (Name, Street, [place]): [facility] [address] 3. On Med List: Yes  4. Last Visit with PCP: 03/22/20  5. Next visit date with PCP: None   Agent: Please be advised that RX refills may take up to 3 business days. We ask that you follow-up with your pharmacy.

## 2020-04-18 ENCOUNTER — Telehealth: Payer: Self-pay

## 2020-04-18 DIAGNOSIS — R059 Cough, unspecified: Secondary | ICD-10-CM

## 2020-04-18 NOTE — Telephone Encounter (Signed)
New message    Need a referral to [facility] reason due to cough   Can leave a message on home voicemail

## 2020-04-19 ENCOUNTER — Encounter: Payer: Self-pay | Admitting: Internal Medicine

## 2020-04-20 ENCOUNTER — Encounter: Payer: Self-pay | Admitting: Internal Medicine

## 2020-04-20 ENCOUNTER — Other Ambulatory Visit: Payer: Self-pay

## 2020-04-20 ENCOUNTER — Ambulatory Visit (INDEPENDENT_AMBULATORY_CARE_PROVIDER_SITE_OTHER): Payer: PPO | Admitting: Internal Medicine

## 2020-04-20 DIAGNOSIS — B379 Candidiasis, unspecified: Secondary | ICD-10-CM | POA: Insufficient documentation

## 2020-04-20 MED ORDER — FLUCONAZOLE 150 MG PO TABS: 150.0000 mg | ORAL_TABLET | ORAL | 0 refills | Status: DC

## 2020-04-20 MED ORDER — TRIAMCINOLONE ACETONIDE 0.1 % EX CREA
1.0000 | TOPICAL_CREAM | Freq: Two times a day (BID) | CUTANEOUS | 0 refills | Status: DC
Start: 2020-04-20 — End: 2020-05-13

## 2020-04-20 NOTE — Progress Notes (Signed)
   Subjective:   Patient ID: Heather Ramsey, female    DOB: [birth year], 73 y.o.   MRN: [id number]  HPI The patient is a 74 YO female coming in for concerns about labial itching. Started about 2 weeks ago. Has been walking outside. Denies discharge but did try 2 courses of monistat otc. Did have some improvement but not fully. She has had yeast infections before which took longer to improve. Denies new sexual partner but still sexually active with husband last about 1 month ago. Denies pain with urination. Overall stable but not improving.   Review of Systems  Constitutional: Negative.   HENT: Negative.   Eyes: Negative.   Respiratory: Negative for cough, chest tightness and shortness of breath.   Cardiovascular: Negative for chest pain, palpitations and leg swelling.  Gastrointestinal: Negative for abdominal distention, abdominal pain, constipation, diarrhea, nausea and vomiting.  Genitourinary:       Labial itching  Musculoskeletal: Negative.   Skin: Negative.   Neurological: Negative.   Psychiatric/Behavioral: Negative.     Objective:  Physical Exam Constitutional:      Appearance: She is well-developed.  HENT:     Head: Normocephalic and atraumatic.  Cardiovascular:     Rate and Rhythm: Normal rate and regular rhythm.  Pulmonary:     Effort: Pulmonary effort is normal. No respiratory distress.     Breath sounds: Normal breath sounds. No wheezing or rales.  Abdominal:     General: Bowel sounds are normal. There is no distension.     Palpations: Abdomen is soft.     Tenderness: There is no abdominal tenderness. There is no rebound.  Genitourinary:    Comments: Declines vaginal exam Musculoskeletal:     Cervical back: Normal range of motion.  Skin:    General: Skin is warm and dry.  Neurological:     Mental Status: She is alert and oriented to person, place, and time.     Coordination: Coordination normal.     Vitals:   04/20/20 0812  BP: 124/82  Pulse: 65    Temp: 98.2 F (36.8 C)  TempSrc: Oral  SpO2: 98%  Weight: 139 lb (63 kg)  Height: 5\' 2"  (1.575 m)    This visit occurred during the SARS-CoV-2 public health emergency.  Safety protocols were in place, including screening questions prior to the visit, additional usage of staff PPE, and extensive cleaning of exam room while observing appropriate contact time as indicated for disinfecting solutions.   Assessment & Plan:

## 2020-04-20 NOTE — Assessment & Plan Note (Signed)
Rx diflucan times 2 and triamcinolone for external irritation. If no improvement should have exam.

## 2020-04-20 NOTE — Patient Instructions (Signed)
We have sent in triamcinolone ointment to use twice a day on the outside to help.  We have sent in the diflucan which is a pill to take 1 pill today and then 1 pill Saturday if still having itching.

## 2020-05-10 DIAGNOSIS — M722 Plantar fascial fibromatosis: Secondary | ICD-10-CM | POA: Diagnosis not present

## 2020-05-10 DIAGNOSIS — M1220 Villonodular synovitis (pigmented), unspecified site: Secondary | ICD-10-CM | POA: Diagnosis not present

## 2020-05-10 DIAGNOSIS — M12272 Villonodular synovitis (pigmented), left ankle and foot: Secondary | ICD-10-CM | POA: Diagnosis not present

## 2020-05-13 ENCOUNTER — Encounter: Payer: Self-pay | Admitting: Pulmonary Disease

## 2020-05-13 ENCOUNTER — Other Ambulatory Visit: Payer: Self-pay

## 2020-05-13 ENCOUNTER — Ambulatory Visit: Payer: PPO | Admitting: Pulmonary Disease

## 2020-05-13 VITALS — BP 114/66 | HR 67 | Temp 98.3°F | Ht 61.0 in | Wt 137.0 lb

## 2020-05-13 DIAGNOSIS — R059 Cough, unspecified: Secondary | ICD-10-CM

## 2020-05-13 DIAGNOSIS — R05 Cough: Secondary | ICD-10-CM | POA: Diagnosis not present

## 2020-05-13 MED ORDER — PANTOPRAZOLE SODIUM 40 MG PO TBEC: 40.0000 mg | DELAYED_RELEASE_TABLET | Freq: Every day | ORAL | 6 refills | Status: DC

## 2020-05-13 NOTE — Patient Instructions (Addendum)
Nice to meet you!  Take pantoprazole once daily as prescribed. I wonder if silent or night time reflux is causing your cough. If so, this should help.   We discussed other possible causes we can explore if this does not help.  Your chest xrays look great.    Come back in 3 months

## 2020-05-17 ENCOUNTER — Encounter: Payer: Self-pay | Admitting: Pulmonary Disease

## 2020-05-17 NOTE — Progress Notes (Signed)
@Patient  ID: Heather Ramsey, female    DOB: 1946-07-05, 74 y.o.   MRN: [id number]  Chief Complaint  Patient presents with  . Consult    referred by PCP for cough.  cough is nonprod, present X2 yrs.      Referring provider: Binnie Rail, MD  HPI:  Heather Ramsey is a 74 year old with past history of hypertension who we are seen in consultation at the request of Billey Gosling, MD for evaluation of chronic cough.  Notes from referring provider reviewed.  Patient states she has had cough for 2 to 3 years.  Largely, it is not bothersome to her.  She has learned to live with it.  She is nagged from time to time by husband and children regarding the cough.  They are worried something is wrong.  The cough is dry.  It seems a bit worse in the morning.  It can occasionally happen at night and awaken her from sleep.  It seems to be random throughout the day.  Is not worse with eating or drinking.  Not worse with exertion.  She has no shortness of breath or dyspnea on exertion.  No other respiratory complaint.  She does have a history of seasonal allergies sometimes worse with season changes.  She denies any nasal congestion or significant postnasal drip.  Does not think cough is worse with allergic symptoms but has not thought about it deeply.  She denies any wheezing.  She denies any frank heartburn or reflux or pressure in her chest consistent with GERD.  She is a never smoker.  She has strong family history of lung cancer and other cancers.  She thinks this likely drives but concern amongst her family members.  Prior imaging reviewed and interpretation as follows chest x-ray 03/17/2018 (a timeframe where symptoms would be occurring) appears clear, query mild hyperinflation on lateral view and chest x-ray 03/22/2020 appears clear with similar appearing mild hyperinflation on lateral view.   PMH: Hypertension, seasonal allergies Surgical history: Back surgery 02/2017, cataract surgery foot  surgery Family history: Father with coronary disease, breast cancer in sister, reported cancer in both parents Social history: Married, retired, never smoker   Licensed conveyancer / Pulmonary Flowsheets:   ACT:  No flowsheet data found.  MMRC: No flowsheet data found.  Epworth:  No flowsheet data found.  Tests:   FENO:  No results found for: NITRICOXIDE  PFT: No flowsheet data found.  WALK:  No flowsheet data found.  Imaging: No results found.  Lab Results:  CBC    Component Value Date/Time   WBC 5.9 03/22/2020 1032   RBC 4.36 03/22/2020 1032   HGB 13.4 03/22/2020 1032   HCT 39.4 03/22/2020 1032   PLT 220.0 03/22/2020 1032   MCV 90.3 03/22/2020 1032   MCH 30.0 02/27/2017 0534   MCHC 34.1 03/22/2020 1032   RDW 13.4 03/22/2020 1032   LYMPHSABS 1.5 03/22/2020 1032   MONOABS 0.4 03/22/2020 1032   EOSABS 0.1 03/22/2020 1032   BASOSABS 0.0 03/22/2020 1032    BMET    Component Value Date/Time   NA 134 (L) 03/22/2020 1032   K 4.0 03/22/2020 1032   CL 99 03/22/2020 1032   CO2 27 03/22/2020 1032   GLUCOSE 97 03/22/2020 1032   BUN 14 03/22/2020 1032   CREATININE 0.80 03/22/2020 1032   CALCIUM 9.4 03/22/2020 1032   GFRNONAA >60 02/27/2017 0534   GFRAA >60 02/27/2017 0534    BNP No results found for: BNP  ProBNP    Component Value Date/Time   PROBNP <30.0 10/06/2008 1145    Specialty Problems      Pulmonary Problems   Cough   RAD (reactive airway disease)      Allergies  Allergen Reactions  . Clarithromycin Swelling and Rash    Chest pain, cysts on sclera with jaundice, GERD, vomiting  . Clindamycin Swelling    angioedema  . Levofloxacin Other (See Comments)    Headache; dysphasia X 30 min  . Tramadol Nausea And Vomiting and Swelling    Fainting, Syncope  . Actonel [Risedronate Sodium] Nausea And Vomiting and Other (See Comments)    Vomiting, migraines  . Atorvastatin Other (See Comments)    MUSCLE ACHES MEMORY  . Amlodipine Swelling  .  Bactroban [Mupirocin Calcium] Rash  . Risedronate Sodium Other (See Comments)    SEVERE HEADACHES    Immunization History  Administered Date(s) Administered  . Fluad Quad(high Dose 65+) 07/01/2019  . Influenza Whole 07/09/2007, 06/21/2010, 07/25/2012, 07/24/2013  . Influenza, High Dose Seasonal PF 07/20/2014, 07/04/2015, 07/07/2018, 07/07/2018  . Influenza,inj,Quad PF,6+ Mos 06/20/2017  . Influenza-Unspecified 07/03/2016  . Moderna SARS-COVID-2 Vaccination 11/06/2019, 12/04/2019  . Pneumococcal Conjugate-13 08/31/2014, 11/15/2015  . Pneumococcal Polysaccharide-23 05/27/2008, 10/21/2013  . Td 01/01/2008  . Zoster 09/24/2008  . Zoster Recombinat (Shingrix) 02/06/2018, 04/11/2018    Past Medical History:  Diagnosis Date  . Allergy   . Atrophic vaginitis   . Chest pain    negative  cardiac evaluation; Dr Percival Spanish  . Diverticulosis of colon 2005& 2010   FH colon cancer  . Fibromyalgia    Dr Estanislado Pandy  . GERD (gastroesophageal reflux disease)   . Heart murmur   . Hemorrhoid   . Hyperlipidemia   . Hypertension   . IBS (irritable bowel syndrome)   . Migraine   . MVP (mitral valve prolapse)   . Osteopenia   . Pulmonary nodule 07/2006  . UTI (lower urinary tract infection) 2005   Citrobacter koseri, hospitalized w/ kidney infection 1983    Tobacco History: Social History   Tobacco Use  Smoking Status Never Smoker  Smokeless Tobacco Never Used   Counseling given: Not Answered   Continue to not smoke  Outpatient Encounter Medications as of 05/13/2020  Medication Sig  . amLODipine (NORVASC) 2.5 MG tablet Take 1 tablet (2.5 mg total) by mouth daily.  Marland Kitchen b complex vitamins tablet Take 1 tablet by mouth daily.    . Calcium Carbonate-Vitamin D (CALCIUM + D PO) Take 1 tablet by mouth 2 (two) times daily.   . Dietary Management Product (VASCULERA) TABS TAKE ONE TABLET BY MOUTH EVERY DAY  . Docosahexaenoic Acid (DHA ALGAL-900 PO) Take 900 mg by mouth daily.  . fish  oil-omega-3 fatty acids 1000 MG capsule Take 1 g by mouth daily.   . Ginger, Zingiber officinalis, (GINGER ROOT) 550 MG CAPS Take 1 capsule by mouth daily.   Marland Kitchen losartan (COZAAR) 50 MG tablet Take 1 tablet (50 mg total) by mouth daily.  . magnesium 30 MG tablet Take 30 mg by mouth 2 (two) times daily.    . Misc Natural Products (TART CHERRY ADVANCED PO) Take by mouth daily.  . Probiotic Product (PROBIOTIC-10 PO) Take by mouth daily.  . Psyllium (METAMUCIL) 28.3 % POWD Take 1 scoop dissolved in at least 8 ounces water/juice and drink twice daily (Patient taking differently: as needed. Take 1 scoop dissolved in at least 8 ounces water/juice and drink twice daily)  . TURMERIC PO Take  400 mg by mouth daily.  . pantoprazole (PROTONIX) 40 MG tablet Take 1 tablet (40 mg total) by mouth daily.  . [DISCONTINUED] fluconazole (DIFLUCAN) 150 MG tablet Take 1 tablet (150 mg total) by mouth every 3 (three) days. (Patient not taking: Reported on 05/13/2020)  . [DISCONTINUED] triamcinolone cream (KENALOG) 0.1 % Apply 1 application topically 2 (two) times daily. (Patient not taking: Reported on 05/13/2020)   No facility-administered encounter medications on file as of 05/13/2020.     Review of Systems  Review of Systems  No chest pain with exertion.  Rest well at night.  Does not wake short of breath.  Comprehensive review of systems otherwise negative.   Physical Exam  BP 114/66 (BP Location: Right Arm, Cuff Size: Normal)   Pulse 67   Temp 98.3 F (36.8 C) (Temporal)   Ht 5\' 1"  (1.549 m)   Wt 137 lb (62.1 kg)   SpO2 97%   BMI 25.89 kg/m   Wt Readings from Last 5 Encounters:  05/13/20 137 lb (62.1 kg)  04/20/20 139 lb (63 kg)  03/22/20 139 lb 12.8 oz (63.4 kg)  02/18/20 139 lb (63 kg)  02/04/20 139 lb (63 kg)    BMI Readings from Last 5 Encounters:  05/13/20 25.89 kg/m  04/20/20 25.42 kg/m  03/22/20 25.57 kg/m  02/18/20 25.42 kg/m  02/04/20 25.42 kg/m     Physical Exam General:  Well-appearing, no acute distress Eyes: EOMI, no icterus Neck: No JVD present, supple Respiratory: Clear to auscultate bilaterally, no work of breathing on room air Cardiovascular: Regular rate and rhythm, no murmurs MSK: No joint effusions, no synovitis Neuro: Normal gait, no weakness Psych: Normal mood, full affect  skin: No rash, no lesions, normal turgor   Assessment & Plan:  Ms. Meloy is a 74 year old with past history of hypertension who we are seen in consultation at the request of Billey Gosling, MD for evaluation of chronic cough.  Chronic cough: Shared with patient 3 most common causes of chronic cough are asthma, postnasal drip, and GERD.  She has 2 chest x-rays during timeframe of symptoms that are clear which is reassuring.  She denies any nasal congestion or postnasal drip symptoms.  Her cough at night in the morning could be consistent with silent reflux at night.  This also could be consistent with asthma.  She does have some atopic symptoms.  Elected to pursue GERD therapy with daily PPI.  If not improved in the coming weeks can consider a trial of inhaled therapy.  Given length of cough could be reflexive and may require speech therapy in the future.  Fortunately, patient is not particularly bothered by cough at this time.   Return in about 3 months (around 08/13/2020).   Lanier Clam, MD 05/17/2020

## 2020-05-26 DIAGNOSIS — D2261 Melanocytic nevi of right upper limb, including shoulder: Secondary | ICD-10-CM | POA: Diagnosis not present

## 2020-05-26 DIAGNOSIS — L821 Other seborrheic keratosis: Secondary | ICD-10-CM | POA: Diagnosis not present

## 2020-05-26 DIAGNOSIS — L814 Other melanin hyperpigmentation: Secondary | ICD-10-CM | POA: Diagnosis not present

## 2020-05-26 DIAGNOSIS — D225 Melanocytic nevi of trunk: Secondary | ICD-10-CM | POA: Diagnosis not present

## 2020-05-26 DIAGNOSIS — D2262 Melanocytic nevi of left upper limb, including shoulder: Secondary | ICD-10-CM | POA: Diagnosis not present

## 2020-05-26 DIAGNOSIS — D1801 Hemangioma of skin and subcutaneous tissue: Secondary | ICD-10-CM | POA: Diagnosis not present

## 2020-05-26 DIAGNOSIS — Z85828 Personal history of other malignant neoplasm of skin: Secondary | ICD-10-CM | POA: Diagnosis not present

## 2020-06-09 DIAGNOSIS — Z961 Presence of intraocular lens: Secondary | ICD-10-CM | POA: Diagnosis not present

## 2020-06-09 DIAGNOSIS — H04123 Dry eye syndrome of bilateral lacrimal glands: Secondary | ICD-10-CM | POA: Diagnosis not present

## 2020-06-09 DIAGNOSIS — H43811 Vitreous degeneration, right eye: Secondary | ICD-10-CM | POA: Diagnosis not present

## 2020-06-09 DIAGNOSIS — H52203 Unspecified astigmatism, bilateral: Secondary | ICD-10-CM | POA: Diagnosis not present

## 2020-06-21 ENCOUNTER — Encounter: Payer: Self-pay | Admitting: Family Medicine

## 2020-06-21 ENCOUNTER — Telehealth (INDEPENDENT_AMBULATORY_CARE_PROVIDER_SITE_OTHER): Payer: PPO | Admitting: Family Medicine

## 2020-06-21 VITALS — Temp 98.7°F | Wt 133.0 lb

## 2020-06-21 DIAGNOSIS — R05 Cough: Secondary | ICD-10-CM | POA: Diagnosis not present

## 2020-06-21 DIAGNOSIS — R197 Diarrhea, unspecified: Secondary | ICD-10-CM

## 2020-06-21 DIAGNOSIS — R059 Cough, unspecified: Secondary | ICD-10-CM

## 2020-06-21 DIAGNOSIS — R0981 Nasal congestion: Secondary | ICD-10-CM

## 2020-06-21 NOTE — Patient Instructions (Addendum)
-  stay home while sick, and if you have [place] please stay home for a full 10 days since the onset of symptoms PLUS one day of no fever and feeling better  -[place] COVID19 testing information: [email or web address] OR (830)[phone]  -can try imodium if any further loose bowels  -can use tylenol  for fevers, aches and pains per instructions  -can use nasal saline a few times per day if nasal congestion, sometime a short course of Afrin nasal spray for 3 days can help as well  -stay hydrated, drink plenty of fluids and eat small healthy meals - avoid dairy  -can take 1000 IU Vit D3 and Vit C lozenges per instructions  -follow up with your doctor in 2-3 days unless improving and feeling better  I hope you are feeling better soon! Seek in-person care or a follow up telemedicine visit promptly if your symptoms worsen, new concerns arise or you are not improving as expected. Call [phone] if severe symptoms.  Please note: I only do telemedicine on Tuesdays and Thursdays for [place]. Please schedule follow up visit with Primary Care docitor or Urgent care if you have any further questions or concerns to avoid delays in care.

## 2020-06-21 NOTE — Progress Notes (Signed)
Virtual Visit via Video Note  I connected with Heather Ramsey  on 06/21/20 at  1:00 PM EDT by a video enabled telemedicine application and verified that I am speaking with the correct person using two identifiers.  Location patient: home, [place] Location provider:work or home office Persons participating in the virtual visit: patient, provider  I discussed the limitations of evaluation and management by telemedicine and the availability of in person appointments. The patient expressed understanding and agreed to proceed.   HPI:  Acute telemedicine visit for Diarrhea: -Onset:yesterday -Symptoms include: loose bowels, multiple episodes, nervous stomach, HA - resolved now, feels a little nauseous, nasal congestion, has chronic cough - a little worse than usual -Denies: no melena or hematochezia, vomiting, fever, SOB, CP, inability to get out of bed, eat or drink, body aches -Has tried:tried  Antacid -denies any known sick contacts -Pertinent past medical history:GERD, constipation -COVID-19 vaccine status: fully vaccinated with Moderna  ROS: See pertinent positives and negatives per HPI.  Past Medical History:  Diagnosis Date  . Allergy   . Atrophic vaginitis   . Chest pain    negative  cardiac evaluation; Dr Percival Spanish  . Diverticulosis of colon 2005& 2010   FH colon cancer  . Fibromyalgia    Dr Estanislado Pandy  . GERD (gastroesophageal reflux disease)   . Heart murmur   . Hemorrhoid   . Hyperlipidemia   . Hypertension   . IBS (irritable bowel syndrome)   . Migraine   . MVP (mitral valve prolapse)   . Osteopenia   . Pulmonary nodule 07/2006  . UTI (lower urinary tract infection) 2005   Citrobacter koseri, hospitalized w/ kidney infection 1983    Past Surgical History:  Procedure Laterality Date  . back surgy  02/26/2017   lumbar 4-5  . BREAST BIOPSY Left 2003  . [place]   with bladder tack   . CATARACT EXTRACTION, BILATERAL  05/2019  . COLONOSCOPY  07/2014    negative; Dr Olevia Perches  . FOOT SURGERY Left 2014     Current Outpatient Medications:  .  amLODipine (NORVASC) 2.5 MG tablet, Take 1 tablet (2.5 mg total) by mouth daily., Disp: 90 tablet, Rfl: 1 .  b complex vitamins tablet, Take 1 tablet by mouth daily.  , Disp: , Rfl:  .  Calcium Carbonate-Vitamin D (CALCIUM + D PO), Take 1 tablet by mouth 2 (two) times daily. , Disp: , Rfl:  .  Dietary Management Product (VASCULERA) TABS, TAKE ONE TABLET BY MOUTH EVERY DAY, Disp: 90 tablet, Rfl: 1 .  Docosahexaenoic Acid (DHA ALGAL-900 PO), Take 900 mg by mouth daily., Disp: , Rfl:  .  fish oil-omega-3 fatty acids 1000 MG capsule, Take 1 g by mouth daily. , Disp: , Rfl:  .  Ginger, Zingiber officinalis, (GINGER ROOT) 550 MG CAPS, Take 1 capsule by mouth daily. , Disp: , Rfl:  .  losartan (COZAAR) 50 MG tablet, Take 1 tablet (50 mg total) by mouth daily., Disp: 90 tablet, Rfl: 1 .  magnesium 30 MG tablet, Take 30 mg by mouth 2 (two) times daily.  , Disp: , Rfl:  .  Misc Natural Products (TART CHERRY ADVANCED PO), Take by mouth daily., Disp: , Rfl:  .  pantoprazole (PROTONIX) 40 MG tablet, Take 1 tablet (40 mg total) by mouth daily., Disp: 30 tablet, Rfl: 6 .  Probiotic Product (PROBIOTIC-10 PO), Take by mouth daily., Disp: , Rfl:  .  Psyllium (METAMUCIL) 28.3 % POWD, Take 1 scoop dissolved in  at least 8 ounces water/juice and drink twice daily (Patient taking differently: as needed. Take 1 scoop dissolved in at least 8 ounces water/juice and drink twice daily), Disp: 1 Bottle, Rfl: 0 .  TURMERIC PO, Take 400 mg by mouth daily., Disp: , Rfl:   EXAM:  VITALS per patient if applicable:  GENERAL: alert, oriented, appears well and in no acute distress  HEENT: atraumatic, conjunttiva clear, no obvious abnormalities on inspection of external nose and ears  NECK: normal movements of the head and neck  LUNGS: on inspection no signs of respiratory distress, breathing rate appears normal, no obvious gross SOB,  gasping or wheezing  CV: no obvious cyanosis  MS: moves all visible extremities without noticeable abnormality  PSYCH/NEURO: pleasant and cooperative, no obvious depression or anxiety, speech and thought processing grossly intact  ASSESSMENT AND PLAN:  Discussed the following assessment and plan:  Diarrhea, unspecified type  Nasal congestion  Cough  -we discussed possible serious and likely etiologies, options for evaluation and workup, limitations of telemedicine visit vs in person visit, treatment, treatment risks and precautions. Pt prefers to treat via telemedicine empirically rather than in person at this moment.  Query viral infection versus other.  She reports she actually does not feel that bad, however she is worried about Covid, as she has heard of some breakthrough cases.  Discussed options for COVID-19 testing and treatment should she have a positive test.  Advised staying home while sick.  She thinks the bowels are little better today.  Advised oral hydration, Imodium if needed and staying away from dairy.  Nasal saline, over-the-counter analgesics and other home care tips were discussed in the patient instructions.  Scheduled follow up with PCP offered: Declined    Advised to seek prompt follow up telemedicine visit or in person care if worsening, new symptoms arise, or if is not improving with treatment. Did let this patient know that I only do telemedicine on Tuesdays and Thursdays for [place]. Advised to schedule follow up visit with PCP or [facility] if any further questions or concerns to avoid delays in care.   I discussed the assessment and treatment plan with the patient. The patient was provided an opportunity to ask questions and all were answered. The patient agreed with the plan and demonstrated an understanding of the instructions.     Lucretia Kern, DO

## 2020-07-04 ENCOUNTER — Encounter: Payer: Self-pay | Admitting: Internal Medicine

## 2020-07-04 MED ORDER — AMLODIPINE BESYLATE 2.5 MG PO TABS: 2.5000 mg | ORAL_TABLET | Freq: Every day | ORAL | 1 refills | Status: DC

## 2020-07-04 MED ORDER — LOSARTAN POTASSIUM 50 MG PO TABS: 50.0000 mg | ORAL_TABLET | Freq: Every day | ORAL | 1 refills | Status: DC

## 2020-07-28 ENCOUNTER — Encounter: Payer: Self-pay | Admitting: Internal Medicine

## 2020-07-31 ENCOUNTER — Other Ambulatory Visit: Payer: Self-pay | Admitting: Internal Medicine

## 2020-07-31 DIAGNOSIS — I872 Venous insufficiency (chronic) (peripheral): Secondary | ICD-10-CM

## 2020-08-05 ENCOUNTER — Telehealth: Payer: Self-pay | Admitting: Internal Medicine

## 2020-08-05 DIAGNOSIS — I10 Essential (primary) hypertension: Secondary | ICD-10-CM

## 2020-08-05 DIAGNOSIS — E7849 Other hyperlipidemia: Secondary | ICD-10-CM

## 2020-08-05 NOTE — Telephone Encounter (Signed)
Is the referral just for follow up or a specific concern - they will need to know for the referral.

## 2020-08-05 NOTE — Telephone Encounter (Signed)
    Patient has seen Dr Percival Spanish at [facility] in the past (over 3 years), she is requesting a new referral to him

## 2020-08-05 NOTE — Progress Notes (Signed)
Office Visit Note  Patient: Heather Ramsey             Date of Birth: 1946-01-25           MRN: [id number]             PCP: Binnie Rail, MD Referring: Binnie Rail, MD Visit Date: 08/08/2020 Occupation: @GUAROCC @  Subjective:  Fatigue   History of Present Illness: Heather Ramsey is a 74 y.o. female with history of fibromyalgia and osteoarthritis.  She states she has been having more frequent muscle spasms and muscle cramps, increased fatigue, and intermittent hot flashes since receiving the covid-19 vaccines.  She states she was evaluated by Dr. Quay Burow and has reported these side effects on the CDCs website.  Her fatigue is also worsened by insomnia. She has difficulty sleeping at night and typically only gets about 5 hours per night.  She continues to have chronic pain and stiffness in her neck and lower back.  She performs back exercises 6 days a week for about 30 minutes which alleviates her symptoms.  She is trying to avoid having neck surgery in the future.  She has noticed increased pain and intermittent inflammation in the right 4th PIP joint.  She has difficulty wearing some of her rings due to the swelling.  She continues to take tumeric, ginger, fish oil, and tart cherry.  She also is taking calcium, vitamin D, and magnesium on a daily basis.  She has not noticed any improvement in her muscle cramps/spasms while taking magnesium.  [facility] [employer] and is considering starting water aerobics.  She has been walking a regular basis for exercise.  She is unable to perform resistive exercises with weights or bands due to the pain in her neck.    Activities of Daily Living:  Patient reports morning stiffness for a few  minutes.   Patient Reports nocturnal pain.  Difficulty dressing/grooming: Denies Difficulty climbing stairs: Denies Difficulty getting out of chair: Denies Difficulty using hands for taps, buttons, cutlery, and/or writing: Reports  Review of Systems    Constitutional: Positive for fatigue.  HENT: Positive for mouth dryness and nose dryness. Negative for mouth sores.   Eyes: Positive for dryness. Negative for pain and itching.  Respiratory: Negative for shortness of breath and difficulty breathing.   Cardiovascular: Positive for palpitations. Negative for chest pain.  Gastrointestinal: Positive for blood in stool and constipation. Negative for diarrhea.  Endocrine: Negative for increased urination.  Genitourinary: Negative for difficulty urinating and painful urination.  Musculoskeletal: Positive for arthralgias, joint pain, joint swelling, myalgias, morning stiffness, muscle tenderness and myalgias.  Skin: Negative for color change, rash and redness.  Allergic/Immunologic: Negative for susceptible to infections.  Neurological: Negative for dizziness, numbness, headaches, memory loss and weakness.  Hematological: Positive for bruising/bleeding tendency.  Psychiatric/Behavioral: Negative for confusion.    PMFS History:  Patient Active Problem List   Diagnosis Date Noted  . Yeast infection 04/20/2020  . RAD (reactive airway disease) 12/29/2019  . Elevated TSH 09/09/2019  . Cough 03/17/2018  . Cardiac murmur 01/07/2018  . Osteoporosis 08/18/2017  . Hyperglycemia 07/30/2017  . Venous stasis dermatitis of both lower extremities 04/09/2017  . Spondylolisthesis at L4-L5 level 02/26/2017  . Fibromyalgia 01/03/2017  . Primary osteoarthritis of both hands 01/03/2017  . Trochanteric bursitis of both hips 01/03/2017  . Hypertension 12/29/2016  . 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 Lumbar radiculopathy 09/04/2016  . 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 Abnormal EKG 02/05/2012  . Atrophic vaginitis   . Basal cell cancer 04/24/2011  . DIVERTICULOSIS, COLON 06/09/2009  . Hyperlipidemia 05/31/2008  . IRRITABLE BOWEL SYNDROME 05/27/2008  . Chronic fatigue syndrome 05/27/2008    Past  Medical History:  Diagnosis Date  . Allergy   . Atrophic vaginitis   . Chest pain    negative  cardiac evaluation; Dr Percival Spanish  . Diverticulosis of colon 2005& 2010   FH colon cancer  . Fibromyalgia    Dr Estanislado Pandy  . GERD (gastroesophageal reflux disease)   . Heart murmur   . Hemorrhoid   . Hyperlipidemia   . Hypertension   . IBS (irritable bowel syndrome)   . Migraine   . MVP (mitral valve prolapse)   . Osteopenia   . Pulmonary nodule 07/2006  . UTI (lower urinary tract infection) 2005   Citrobacter koseri, hospitalized w/ kidney infection 1983    Family History  Problem Relation Age of Onset  . Heart attack Father 73       Died with multiple medical problems  . Colon cancer Father 55  . Diabetes Father   . Tuberculosis Paternal Grandmother   . Stroke Mother 31  . Hypothyroidism Mother   . Hypertension Mother   . Hypothyroidism Sister   . Hypertension Sister   . Diabetes Sister   . Breast cancer Sister   . Diabetes Sister   . Diabetes Sister   . Heart attack Sister        2 sisters > 22  . Diabetes Sister   . Diabetes Sister   . Diabetes Sister   . Colon polyps Neg Hx   . Esophageal cancer Neg Hx   . Rectal cancer Neg Hx   . Stomach cancer Neg Hx    Past Surgical History:  Procedure Laterality Date  . back surgy  02/26/2017   lumbar 4-5  . BREAST BIOPSY Left 2003  . [place]   with bladder tack   . CATARACT EXTRACTION, BILATERAL  05/2019  . COLONOSCOPY  07/2014   negative; Dr Olevia Perches  . FOOT SURGERY Left 2014   Social History   Social History Narrative   Regular exercise- yes: walks, but not as much due to knee arthritis.  Does stretching   Lives with husband.         Immunization History  Administered Date(s) Administered  . Fluad Quad(high Dose 65+) 07/01/2019  . Influenza Whole 07/09/2007, 06/21/2010, 07/25/2012, 07/24/2013  . Influenza, High Dose Seasonal PF 07/20/2014, 07/04/2015, 07/07/2018, 07/07/2018  . Influenza,inj,Quad  PF,6+ Mos 06/20/2017  . Influenza-Unspecified 07/03/2016  . Moderna SARS-COVID-2 Vaccination 11/06/2019, 12/04/2019  . Pneumococcal Conjugate-13 08/31/2014, 11/15/2015  . Pneumococcal Polysaccharide-23 05/27/2008, 10/21/2013  . Td 01/01/2008  . Zoster 09/24/2008  . Zoster Recombinat (Shingrix) 02/06/2018, 04/11/2018     Objective: Vital Signs: BP (!) 146/82 (BP Location: Left Arm, Patient Position: Sitting, Cuff Size: Normal)   Pulse 62   Resp 14   Ht 5\' 1"  (1.549 m)   Wt 138 lb (62.6 kg)   BMI 26.07 kg/m    Physical Exam Vitals and nursing note reviewed.  Constitutional:      Appearance: She is well-developed.  HENT:     Head: Normocephalic and atraumatic.  Eyes:     Conjunctiva/sclera: Conjunctivae normal.  Abdominal:     Palpations: Abdomen is soft.  Musculoskeletal:     Cervical back: Normal range of motion.  Skin:  General: Skin is warm and dry.     Capillary Refill: Capillary refill takes less than 2 seconds.  Neurological:     Mental Status: She is alert and oriented to person, place, and time.  Psychiatric:        Behavior: Behavior normal.      Musculoskeletal Exam: C-spine limited ROM with extension and slightly with lateral rotation.  Mild postural thoracic kyphosis noted.  Midline spinal tenderness in the lumbar region.  Shoulder joints, elbow joints, wrist joints, MCPs, PIPs, and DIPs good ROM with no synovitis.  Complete fist formation bilaterally.  Hip joints good ROM with no discomfort.  Mild tenderness over the trochanteric bursa bilaterally.  Knee joints good ROM with no discomfort.  No warmth or effusion of knee joints.  Ankle joints good ROM with no tenderness or inflammation.   CDAI Exam: CDAI Score: -- Patient Global: --; Provider Global: -- Swollen: --; Tender: -- Joint Exam 08/08/2020   No joint exam has been documented for this visit   There is currently no information documented on the homunculus. Go to the Rheumatology activity and  complete the homunculus joint exam.  Investigation: No additional findings.  Imaging: No results found.  Recent Labs: Lab Results  Component Value Date   WBC 5.9 03/22/2020   HGB 13.4 03/22/2020   PLT 220.0 03/22/2020   NA 134 (L) 03/22/2020   K 4.0 03/22/2020   CL 99 03/22/2020   CO2 27 03/22/2020   GLUCOSE 97 03/22/2020   BUN 14 03/22/2020   CREATININE 0.80 03/22/2020   BILITOT 0.6 03/22/2020   ALKPHOS 47 03/22/2020   AST 21 03/22/2020   ALT 19 03/22/2020   PROT 6.9 03/22/2020   ALBUMIN 4.6 03/22/2020   CALCIUM 9.4 03/22/2020   GFRAA >60 02/27/2017    Speciality Comments: No specialty comments available.  Procedures:  No procedures performed Allergies: Clarithromycin, Clindamycin, Levofloxacin, Tramadol, Actonel [risedronate sodium], Atorvastatin, Amlodipine, Bactroban [mupirocin calcium], and Risedronate sodium   Assessment / Plan:     Visit Diagnoses: Fibromyalgia: She has generalized hyperalgesia and positive tender points on exam.  She has been experiencing more frequent muscle cramps and muscle spasms.  She takes a magnesium supplement over-the-counter.  We discussed trying magnesium malate at bedtime to help with nocturnal spasms and cramping.  We discussed potential side effects.  She continues to have chronic secondary to insomnia.  She has noticed increased fatigue and is having her COVID-19 vaccinations in spring 2021.  We discussed the importance of regular exercise.  She recently joined the [facility] and was encouraged to start water aerobics or water therapy.  We also discussed the importance of good sleep hygiene.  She plans on trying chamomile tea at bedtime.  She was encouraged to continue to take natural anti-inflammatories as previously discussed.  She will follow-up in the office in 6 months.  Other fatigue: Chronic and secondary to insomnia.  Discussed importance of regular exercise.  Primary insomnia: She has difficulty falling asleep as well as staying  asleep at night.  She has been sleeping about 5 hours per night.  We discussed the importance of good sleep hygiene.  History of osteopenia: She is taking a calcium and vitamin D supplement.  She was also given a list of natural sources of calcium that she can obtain from her diet.  She does not plan on having updated bone densities due to being resistant to the treatment options.  She took Actonel and risedronate sodium but discontinued due to  side effects in the past. We discussed the importance of resistive exercises.   Primary osteoarthritis of both hands: She has PIP and DIP thickening consistent with osteoarthritis of both hands.  She is able to make a complete fist bilaterally.  She has tenderness palpation over the right fourth PIP joint has noticed intermittent inflammation.  We discussed the use of Voltaren gel which she can apply topically as needed for symptomatic relief.  We also discussed the importance of continuing to take natural anti-inflammatories.  Spondylolisthesis at L4-L5 level: Chronic pain.  She has been performing back exercises 6 days a week for at least 30 minutes.  She has midline spinal tenderness in the lumbar region.  She is not experiencing any symptoms of radiculopathy at this time.  Spinal stenosis of lumbar region with neurogenic claudication: Chronic pain.  She had surgery about 3.5 years ago.  She has been performing back exercises 6 days a week for at least 30 minutes which alleviates her discomfort and stiffness.   Other medical conditions are listed as follows:   Venous stasis dermatitis of both lower extremities  History of basal cell cancer  History of IBS  History of hypertension  History of hyperlipidemia  PVNS (pigmented villonodular synovitis)  History of diverticulosis  Orders: No orders of the defined types were placed in this encounter.  No orders of the defined types were placed in this encounter.    Follow-Up Instructions: Return in  about 6 months (around 02/05/2021) for Fibromyalgia, Osteoarthritis.   Ofilia Neas, PA-C  Note - This record has been created using Dragon software.  Chart creation errors have been sought, but may not always  have been located. Such creation errors do not reflect on  the standard of medical care.

## 2020-08-08 ENCOUNTER — Encounter: Payer: Self-pay | Admitting: Physician Assistant

## 2020-08-08 ENCOUNTER — Ambulatory Visit: Payer: PPO | Admitting: Physician Assistant

## 2020-08-08 ENCOUNTER — Other Ambulatory Visit: Payer: Self-pay

## 2020-08-08 VITALS — BP 146/82 | HR 62 | Resp 14 | Ht 61.0 in | Wt 138.0 lb

## 2020-08-08 DIAGNOSIS — F5101 Primary insomnia: Secondary | ICD-10-CM | POA: Diagnosis not present

## 2020-08-08 DIAGNOSIS — Z8739 Personal history of other diseases of the musculoskeletal system and connective tissue: Secondary | ICD-10-CM

## 2020-08-08 DIAGNOSIS — Z85828 Personal history of other malignant neoplasm of skin: Secondary | ICD-10-CM

## 2020-08-08 DIAGNOSIS — Z8679 Personal history of other diseases of the circulatory system: Secondary | ICD-10-CM

## 2020-08-08 DIAGNOSIS — M797 Fibromyalgia: Secondary | ICD-10-CM | POA: Diagnosis not present

## 2020-08-08 DIAGNOSIS — R5383 Other fatigue: Secondary | ICD-10-CM

## 2020-08-08 DIAGNOSIS — M48062 Spinal stenosis, lumbar region with neurogenic claudication: Secondary | ICD-10-CM | POA: Diagnosis not present

## 2020-08-08 DIAGNOSIS — M19041 Primary osteoarthritis, right hand: Secondary | ICD-10-CM

## 2020-08-08 DIAGNOSIS — Z8639 Personal history of other endocrine, nutritional and metabolic disease: Secondary | ICD-10-CM | POA: Diagnosis not present

## 2020-08-08 DIAGNOSIS — M4316 Spondylolisthesis, lumbar region: Secondary | ICD-10-CM

## 2020-08-08 DIAGNOSIS — I872 Venous insufficiency (chronic) (peripheral): Secondary | ICD-10-CM

## 2020-08-08 DIAGNOSIS — Z8719 Personal history of other diseases of the digestive system: Secondary | ICD-10-CM | POA: Diagnosis not present

## 2020-08-08 DIAGNOSIS — M1220 Villonodular synovitis (pigmented), unspecified site: Secondary | ICD-10-CM

## 2020-08-08 DIAGNOSIS — M19042 Primary osteoarthritis, left hand: Secondary | ICD-10-CM

## 2020-08-23 DIAGNOSIS — Z1231 Encounter for screening mammogram for malignant neoplasm of breast: Secondary | ICD-10-CM | POA: Diagnosis not present

## 2020-08-23 LAB — HM MAMMOGRAPHY

## 2020-09-01 ENCOUNTER — Encounter: Payer: Self-pay | Admitting: Internal Medicine

## 2020-09-01 NOTE — Progress Notes (Signed)
Outside notes received. Information abstracted. Notes sent to scan.  

## 2020-09-07 DIAGNOSIS — I341 Nonrheumatic mitral (valve) prolapse: Secondary | ICD-10-CM | POA: Insufficient documentation

## 2020-09-07 NOTE — Progress Notes (Signed)
Cardiology Office Note   Date:  09/09/2020   ID:  Heather Ramsey, DOB 1946-01-28, MRN [id number]  PCP:  Binnie Rail, MD  Cardiologist:   No primary care provider on file. Referring:  Binnie Rail, MD  Chief Complaint  Patient presents with  . Heart Murmur      History of Present Illness: Heather Ramsey is a 74 y.o. female who presents for Binnie Rail, MD  I saw her in 2013.  She had poor anterior R wave progression but she had had a negative perfusion study in 2010 so no further evaluation was undertaken.   There was a mention of MVP on previous notes but I have not seen an echo on her.  She presents for follow-up with strong family history and poor anterior R wave progression on EKG.  She does get shortness of breath if she walks uphill.  She is not describing any resting shortness of breath, PND or orthopnea.  She has not had any palpitations, presyncope or syncope.  She has had no weight gain or edema.  She does do some walking for exercise and does her household chores.   Past Medical History:  Diagnosis Date  . Allergy   . Atrophic vaginitis   . Chest pain    negative  cardiac evaluation; Dr Percival Spanish  . Diverticulosis of colon 2005& 2010   FH colon cancer  . Fibromyalgia    Dr Estanislado Pandy  . GERD (gastroesophageal reflux disease)   . Hemorrhoid   . Hyperlipidemia   . Hypertension   . IBS (irritable bowel syndrome)   . Migraine   . MVP (mitral valve prolapse)   . Osteopenia   . Pulmonary nodule 07/2006  . UTI (lower urinary tract infection) 2005   Citrobacter koseri, hospitalized w/ kidney infection 1983    Past Surgical History:  Procedure Laterality Date  . back surgy  02/26/2017   lumbar 4-5  . BREAST BIOPSY Left 2003  . [place]   with bladder tack   . CATARACT EXTRACTION, BILATERAL  05/2019  . COLONOSCOPY  07/2014   negative; Dr Olevia Perches  . FOOT SURGERY Left 2014     Current Outpatient Medications  Medication Sig  Dispense Refill  . amLODipine (NORVASC) 2.5 MG tablet Take 1 tablet (2.5 mg total) by mouth daily. 90 tablet 1  . b complex vitamins tablet Take 1 tablet by mouth daily.    . Calcium Carbonate-Vitamin D (CALCIUM + D PO) Take 1 tablet by mouth 2 (two) times daily.     . Dietary Management Product (VASCULERA) TABS TAKE ONE TABLET BY MOUTH EVERY DAY 90 tablet 1  . Docosahexaenoic Acid (DHA ALGAL-900 PO) Take 900 mg by mouth daily.    . fish oil-omega-3 fatty acids 1000 MG capsule Take 1 g by mouth daily.     . Ginger, Zingiber officinalis, (GINGER ROOT) 550 MG CAPS Take 1 capsule by mouth daily.    Marland Kitchen losartan (COZAAR) 50 MG tablet Take 1 tablet (50 mg total) by mouth daily. 90 tablet 1  . magnesium 30 MG tablet Take 30 mg by mouth 2 (two) times daily.    . Misc Natural Products (TART CHERRY ADVANCED PO) Take by mouth daily.    . Probiotic Product (PROBIOTIC-10 PO) Take by mouth daily.    . Psyllium (METAMUCIL) 28.3 % POWD Take 1 scoop dissolved in at least 8 ounces water/juice and drink twice daily (Patient taking differently: daily. Take  1 scoop dissolved in at least 8 ounces water/juice and drink twice daily) 1 Bottle 0  . TURMERIC PO Take 400 mg by mouth daily.    . pantoprazole (PROTONIX) 40 MG tablet Take 1 tablet (40 mg total) by mouth daily. 30 tablet 6   No current facility-administered medications for this visit.    Allergies:   Clarithromycin, Clindamycin, Levofloxacin, Tramadol, Actonel [risedronate sodium], Atorvastatin, Bactroban [mupirocin calcium], and Risedronate sodium    Social History:  The patient  reports that she has never smoked. She has never used smokeless tobacco. She reports that she does not drink alcohol and does not use drugs.   Family History:  The patient's family history includes Breast cancer in her sister; Colon cancer (age of onset: 42) in her father; Diabetes in her father, sister, sister, sister, sister, sister, and sister; Heart attack in her sister; Heart  attack (age of onset: 70) in her father; Hypertension in her mother and sister; Hypothyroidism in her mother and sister; Stroke (age of onset: 15) in her mother; Tuberculosis in her paternal grandmother.    ROS:  Please see the history of present illness.   Otherwise, review of systems are positive for none.   All other systems are reviewed and negative.    PHYSICAL EXAM: VS:  BP 126/82   Pulse 70   Ht 5' 1.5" (1.562 m)   Wt 141 lb (64 kg)   SpO2 97%   BMI 26.21 kg/m  , BMI Body mass index is 26.21 kg/m. GENERAL:  Well appearing HEENT:  Pupils equal round and reactive, fundi not visualized, oral mucosa unremarkable NECK:  No jugular venous distention, waveform within normal limits, carotid upstroke brisk and symmetric, no bruits, no thyromegaly LYMPHATICS:  No cervical, inguinal adenopathy LUNGS:  Clear to auscultation bilaterally BACK:  No CVA tenderness CHEST:  Unremarkable HEART:  PMI not displaced or sustained,S1 and S2 within normal limits, no S3, no S4, no clicks, no rubs, 2 out of 6 apical systolic murmur nonradiating, possibly holosystolic, no diastolic murmurs ABD:  Flat, positive bowel sounds normal in frequency in pitch, no bruits, no rebound, no guarding, no midline pulsatile mass, no hepatomegaly, no splenomegaly EXT:  2 plus pulses throughout, no edema, no cyanosis no clubbing SKIN:  No rashes no nodules NEURO:  Cranial nerves II through XII grossly intact, motor grossly intact throughout PSYCH:  Cognitively intact, oriented to person place and time    EKG:  EKG is ordered today. The ekg ordered today demonstrates sinus rhythm, rate seventy-eight, axis within normal limits, intervals within normal limits, poor anterior R wave progression, no acute ST-T wave changes.   Recent Labs: 03/22/2020: ALT 19; BUN 14; Creatinine, Ser 0.80; Hemoglobin 13.4; Platelets 220.0; Potassium 4.0; Sodium 134; TSH 2.55    Lipid Panel    Component Value Date/Time   CHOL 233 (H)  03/22/2020 1032   CHOL 232 (H) 10/27/2014 1042   TRIG 98.0 03/22/2020 1032   TRIG 56 10/27/2014 1042   HDL 80.30 03/22/2020 1032   HDL 85 10/27/2014 1042   CHOLHDL 3 03/22/2020 1032   VLDL 19.6 03/22/2020 1032   LDLCALC 134 (H) 03/22/2020 1032   LDLCALC 136 (H) 10/27/2014 1042   LDLDIRECT 186.4 10/21/2013 0926      Wt Readings from Last 3 Encounters:  09/09/20 141 lb (64 kg)  08/08/20 138 lb (62.6 kg)  06/21/20 133 lb (60.3 kg)      Other studies Reviewed: Additional studies/ records that were reviewed today  include: Labs. Review of the above records demonstrates:  Please see elsewhere in the note.     ASSESSMENT AND PLAN:  ABNORMAL EKG:   I think the pretest probability of obstructive coronary disease is somewhat low but she has a very strong family history.  I am going to start with a coronary calcium score.  MVP: She does have a slight murmur.  I am going to order an echocardiogram.  DYSLIPIDEMIA: She has an LDL of one hundred and thirty-four.  I think goals of therapy will be based on the calcium score.  She might consent to try pravastatin if she has significant calcium.  She has been intolerant of some statins in the past.   Current medicines are reviewed at length with the patient today.  The patient does not have concerns regarding medicines.  The following changes have been made:  no change  Labs/ tests ordered today include:   Orders Placed This Encounter  Procedures  . CT CARDIAC SCORING (SELF PAY ONLY)  . EKG 12-Lead  . ECHOCARDIOGRAM COMPLETE     Disposition:   FU with me as needed based on the results of the above   Signed, Minus Breeding, MD  09/09/2020 10:24 AM    [place]

## 2020-09-09 ENCOUNTER — Encounter: Payer: Self-pay | Admitting: Cardiology

## 2020-09-09 ENCOUNTER — Ambulatory Visit: Payer: PPO | Admitting: Cardiology

## 2020-09-09 ENCOUNTER — Other Ambulatory Visit: Payer: Self-pay

## 2020-09-09 VITALS — BP 126/82 | HR 70 | Ht 61.5 in | Wt 141.0 lb

## 2020-09-09 DIAGNOSIS — I341 Nonrheumatic mitral (valve) prolapse: Secondary | ICD-10-CM

## 2020-09-09 DIAGNOSIS — R9431 Abnormal electrocardiogram [ECG] [EKG]: Secondary | ICD-10-CM | POA: Diagnosis not present

## 2020-09-09 DIAGNOSIS — Z8249 Family history of ischemic heart disease and other diseases of the circulatory system: Secondary | ICD-10-CM | POA: Diagnosis not present

## 2020-09-09 DIAGNOSIS — R011 Cardiac murmur, unspecified: Secondary | ICD-10-CM

## 2020-09-09 NOTE — Patient Instructions (Signed)
Medication Instructions:  No changes *If you need a refill on your cardiac medications before your next appointment, please call your pharmacy*  Lab Work: None ordered this visit  Testing/Procedures: Your physician has requested that you have an echocardiogram. Echocardiography is a painless test that uses sound waves to create images of your heart. It provides your doctor with information about the size and shape of your heart and how well your heart's chambers and valves are working. This procedure takes approximately one hour. There are no restrictions for this procedure.  Coronary Calcium Score  Follow-Up: At [facility], you and your health needs are our priority.  As part of our continuing mission to provide you with exceptional heart care, we have created designated Provider Care Teams.  These Care Teams include your primary Cardiologist (physician) and Advanced Practice Providers (APPs -  Physician Assistants and Nurse Practitioners) who all work together to provide you with the care you need, when you need it.  Your next appointment:   Follow up as needed  Dr. Percival Spanish has ordered a CT coronary calcium score. This test is done at [address]. This is $99 out of pocket.   Coronary CalciumScan A coronary calcium scan is an imaging test used to look for deposits of calcium and other fatty materials (plaques) in the inner lining of the blood vessels of the heart (coronary arteries). These deposits of calcium and plaques can partly clog and narrow the coronary arteries without producing any symptoms or warning signs. This puts a person at risk for a heart attack. This test can detect these deposits before symptoms develop. Tell a health care provider about:  Any allergies you have.  All medicines you are taking, including vitamins, herbs, eye drops, creams, and over-the-counter medicines.  Any problems you or family members have had with anesthetic  medicines.  Any blood disorders you have.  Any surgeries you have had.  Any medical conditions you have.  Whether you are pregnant or may be pregnant. What are the risks? Generally, this is a safe procedure. However, problems may occur, including:  Harm to a pregnant woman and her unborn baby. This test involves the use of radiation. Radiation exposure can be dangerous to a pregnant woman and her unborn baby. If you are pregnant, you generally should not have this procedure done.  Slight increase in the risk of cancer. This is because of the radiation involved in the test. What happens before the procedure? No preparation is needed for this procedure. What happens during the procedure?  You will undress and remove any jewelry around your neck or chest.  You will put on a hospital gown.  Sticky electrodes will be placed on your chest. The electrodes will be connected to an electrocardiogram (ECG) machine to record a tracing of the electrical activity of your heart.  A CT scanner will take pictures of your heart. During this time, you will be asked to lie still and hold your breath for 2-3 seconds while a picture of your heart is being taken. The procedure may vary among health care providers and hospitals. What happens after the procedure?  You can get dressed.  You can return to your normal activities.  It is up to you to get the results of your test. Ask your health care provider, or the department that is doing the test, when your results will be ready. Summary  A coronary calcium scan is an imaging test used to look for  deposits of calcium and other fatty materials (plaques) in the inner lining of the blood vessels of the heart (coronary arteries).  Generally, this is a safe procedure. Tell your health care provider if you are pregnant or may be pregnant.  No preparation is needed for this procedure.  A CT scanner will take pictures of your heart.  You can return to  your normal activities after the scan is done. This information is not intended to replace advice given to you by your health care provider. Make sure you discuss any questions you have with your health care provider. Document Released: 03/08/2008 Document Revised: 07/30/2016 Document Reviewed: 07/30/2016 Elsevier Interactive Patient Education  2017 [employer].

## 2020-10-17 ENCOUNTER — Other Ambulatory Visit: Payer: Self-pay

## 2020-10-17 ENCOUNTER — Ambulatory Visit (HOSPITAL_COMMUNITY): Payer: PPO | Attending: Cardiology

## 2020-10-17 ENCOUNTER — Telehealth: Payer: Self-pay | Admitting: Cardiology

## 2020-10-17 ENCOUNTER — Ambulatory Visit (INDEPENDENT_AMBULATORY_CARE_PROVIDER_SITE_OTHER)
Admission: RE | Admit: 2020-10-17 | Discharge: 2020-10-17 | Disposition: A | Discharge status: Home / Self Care | Payer: Self-pay | Source: Ambulatory Visit | Attending: Cardiology | Admitting: Cardiology

## 2020-10-17 DIAGNOSIS — I341 Nonrheumatic mitral (valve) prolapse: Secondary | ICD-10-CM | POA: Insufficient documentation

## 2020-10-17 DIAGNOSIS — R011 Cardiac murmur, unspecified: Secondary | ICD-10-CM | POA: Insufficient documentation

## 2020-10-17 DIAGNOSIS — Z8249 Family history of ischemic heart disease and other diseases of the circulatory system: Secondary | ICD-10-CM

## 2020-10-17 LAB — ECHOCARDIOGRAM COMPLETE
Area-P 1/2: 3.17 cm2
S' Lateral: 2.4 cm

## 2020-10-17 NOTE — Telephone Encounter (Signed)
Heather Ramsey with Radiology would like to make Dr. Percival Spanish aware that the patient's CT results are available for review.   Call back #: [phone]

## 2020-10-27 ENCOUNTER — Other Ambulatory Visit: Payer: Self-pay

## 2020-10-27 DIAGNOSIS — R931 Abnormal findings on diagnostic imaging of heart and coronary circulation: Secondary | ICD-10-CM

## 2020-10-27 DIAGNOSIS — R0602 Shortness of breath: Secondary | ICD-10-CM

## 2020-10-27 DIAGNOSIS — I7789 Other specified disorders of arteries and arterioles: Secondary | ICD-10-CM

## 2020-10-27 NOTE — Progress Notes (Signed)
Lexiscan myoview ordered for SOB, elevated coronary calcium score and inability to perform exercise tolerance test.

## 2020-10-31 ENCOUNTER — Other Ambulatory Visit: Payer: Self-pay | Admitting: Internal Medicine

## 2020-11-03 ENCOUNTER — Telehealth (HOSPITAL_COMMUNITY): Payer: Self-pay | Admitting: *Deleted

## 2020-11-03 NOTE — Telephone Encounter (Signed)
Close encounter 

## 2020-11-04 ENCOUNTER — Other Ambulatory Visit: Payer: Self-pay

## 2020-11-04 ENCOUNTER — Ambulatory Visit (HOSPITAL_COMMUNITY)
Admission: RE | Admit: 2020-11-04 | Discharge: 2020-11-04 | Disposition: A | Discharge status: Home / Self Care | Payer: PPO | Source: Ambulatory Visit | Attending: Cardiovascular Disease | Admitting: Cardiovascular Disease

## 2020-11-04 DIAGNOSIS — R0602 Shortness of breath: Secondary | ICD-10-CM | POA: Insufficient documentation

## 2020-11-04 DIAGNOSIS — R931 Abnormal findings on diagnostic imaging of heart and coronary circulation: Secondary | ICD-10-CM | POA: Insufficient documentation

## 2020-11-04 LAB — MYOCARDIAL PERFUSION IMAGING
LV dias vol: 73 mL (ref 46–106)
LV sys vol: 22 mL
Peak HR: 95 {beats}/min
Rest HR: 57 {beats}/min
SDS: 1
SRS: 0
SSS: 1
TID: 0.99

## 2020-11-04 MED ORDER — REGADENOSON 0.4 MG/5ML IV SOLN
0.4000 mg | Freq: Once | INTRAVENOUS | Status: AC
  Administered 2020-11-04: 0.4 mg via INTRAVENOUS

## 2020-11-04 MED ORDER — TECHNETIUM TC 99M TETROFOSMIN IV KIT
30.7000 | PACK | Freq: Once | INTRAVENOUS | Status: AC | PRN
  Administered 2020-11-04: 30.7 via INTRAVENOUS
  Filled 2020-11-04: qty 31

## 2020-11-04 MED ORDER — TECHNETIUM TC 99M TETROFOSMIN IV KIT
10.9000 | PACK | Freq: Once | INTRAVENOUS | Status: AC | PRN
  Administered 2020-11-04: 10.9 via INTRAVENOUS
  Filled 2020-11-04: qty 11

## 2020-11-17 ENCOUNTER — Other Ambulatory Visit: Payer: Self-pay

## 2020-11-17 DIAGNOSIS — Z01812 Encounter for preprocedural laboratory examination: Secondary | ICD-10-CM

## 2020-11-21 ENCOUNTER — Encounter: Payer: Self-pay | Admitting: Internal Medicine

## 2020-11-21 ENCOUNTER — Telehealth (INDEPENDENT_AMBULATORY_CARE_PROVIDER_SITE_OTHER): Payer: PPO | Admitting: Internal Medicine

## 2020-11-21 ENCOUNTER — Telehealth: Payer: Self-pay | Admitting: Internal Medicine

## 2020-11-21 DIAGNOSIS — J209 Acute bronchitis, unspecified: Secondary | ICD-10-CM

## 2020-11-21 MED ORDER — GUAIFENESIN-CODEINE 100-10 MG/5ML PO SOLN: 5.0000 mL | Freq: Three times a day (TID) | ORAL | 0 refills | Status: DC | PRN

## 2020-11-21 MED ORDER — PROMETHAZINE-DM 6.25-15 MG/5ML PO SYRP: 5.0000 mL | ORAL_SOLUTION | Freq: Four times a day (QID) | ORAL | 0 refills | Status: DC | PRN

## 2020-11-21 MED ORDER — DOXYCYCLINE HYCLATE 100 MG PO TABS: 100.0000 mg | ORAL_TABLET | Freq: Two times a day (BID) | ORAL | 0 refills | Status: DC

## 2020-11-21 NOTE — Telephone Encounter (Signed)
Substitute sent

## 2020-11-21 NOTE — Progress Notes (Signed)
Virtual Visit via Video Note  I connected with Heather Ramsey on 11/21/20 at  2:15 PM EST by a video enabled telemedicine application and verified that I am speaking with the correct person using two identifiers.   I discussed the limitations of evaluation and management by telemedicine and the availability of in person appointments. The patient expressed understanding and agreed to proceed.  Present for the visit:  Myself, Dr Billey Gosling, Carloyn Jaeger.  The patient is currently at home and I am in the office.    No referring provider.    History of Present Illness: This is an acute visit for cold symptoms   Bronchial cough x 7 days.  She has wheezing at night and with cough.  She denies any shortness of breath, but admits she has not been moving very much.  She had chills on a couple of occasions, but denies any fevers.  She has had a lot of nasal congestion, but no other cold symptoms.  She feels she typically gets something like this once a year.  She took a covid test today and it was negative.    She is taking mucinex-DM.    Review of Systems  Constitutional: Positive for chills. Negative for fever.  HENT: Positive for congestion. Negative for sinus pain and sore throat.   Respiratory: Positive for cough and wheezing. Negative for sputum production (nothing coming up) and shortness of breath (but not doing much).   Gastrointestinal: Negative for abdominal pain and nausea.  Neurological: Negative for headaches.       Lightheadedness      Social History   Socioeconomic History  . Marital status: Married    Spouse name: Not on file  . Number of children: 2  . Years of education: Not on file  . Highest education level: Not on file  Occupational History  . Occupation: retired  Tobacco Use  . Smoking status: Never Smoker  . Smokeless tobacco: Never Used  Vaping Use  . Vaping Use: Never used  Substance and Sexual Activity  . Alcohol use: No  . Drug use: No   . Sexual activity: Yes    Birth control/protection: Post-menopausal  Other Topics Concern  . Not on file  Social History Narrative   Regular exercise- yes: walks, but not as much due to knee arthritis.  Does stretching   Lives with husband.         Social Determinants of Health   Financial Resource Strain: Not on file  Food Insecurity: Not on file  Transportation Needs: Not on file  Physical Activity: Not on file  Stress: Not on file  Social Connections: Not on file     Observations/Objective: Appears well in NAD Breathing normally, occasional cough Skin appears warm and dry  Assessment and Plan:  See Problem List for Assessment and Plan of chronic medical problems.   Follow Up Instructions:    I discussed the assessment and treatment plan with the patient. The patient was provided an opportunity to ask questions and all were answered. The patient agreed with the plan and demonstrated an understanding of the instructions.   The patient was advised to call back or seek an in-person evaluation if the symptoms worsen or if the condition fails to improve as anticipated.    Binnie Rail, MD

## 2020-11-21 NOTE — Telephone Encounter (Signed)
Pharmacy called and said that they do not have promethazine-dextromethorphan (PROMETHAZINE-DM) 6.25-15 MG/5ML syrup. Please advise.

## 2020-12-05 ENCOUNTER — Telehealth: Payer: Self-pay | Admitting: Internal Medicine

## 2020-12-05 DIAGNOSIS — J209 Acute bronchitis, unspecified: Secondary | ICD-10-CM | POA: Diagnosis not present

## 2020-12-05 DIAGNOSIS — J069 Acute upper respiratory infection, unspecified: Secondary | ICD-10-CM | POA: Diagnosis not present

## 2020-12-05 DIAGNOSIS — R5383 Other fatigue: Secondary | ICD-10-CM | POA: Diagnosis not present

## 2020-12-05 NOTE — Telephone Encounter (Signed)
They will do that at urgent care and then she does not have to come here to see if she has covid

## 2020-12-05 NOTE — Telephone Encounter (Signed)
She really needs to be evaluated in person.  I would recommend she goes to urgent care

## 2020-12-05 NOTE — Telephone Encounter (Signed)
Patient is still having SOB, wheezing, and congestion. The cough syrup that was prescribed from her virtual visit is not helping. She said that she feels very weak still and having wheezing at night.   Please advise.   Best contact [phone]

## 2020-12-07 ENCOUNTER — Encounter: Payer: Self-pay | Admitting: Internal Medicine

## 2020-12-26 ENCOUNTER — Other Ambulatory Visit: Payer: Self-pay | Admitting: Internal Medicine

## 2021-01-23 NOTE — Progress Notes (Signed)
Office Visit Note  Patient: Heather Ramsey             Date of Birth: 12-20-1945           MRN: [id number]             PCP: Binnie Rail, MD Referring: Binnie Rail, MD Visit Date: 02/06/2021 Occupation: @GUAROCC @  Subjective:  Joint pain and stiffness.   History of Present Illness: Heather Ramsey is a 75 y.o. female with a history of osteoarthritis and fibromyalgia.  She states she continues to have some pain and stiffness in her hands.  She also has some discomfort in her knee joints.  She has generalized pain from fibromyalgia which comes and goes.  She also gives history of insomnia and fatigue.  She denies any history of joint swelling.  Activities of Daily Living:  Patient reports morning stiffness for 10-15 minutes.   Patient Denies nocturnal pain.  Difficulty dressing/grooming: Denies Difficulty climbing stairs: Denies Difficulty getting out of chair: Denies Difficulty using hands for taps, buttons, cutlery, and/or writing: Reports  Review of Systems  Constitutional: Positive for fatigue. Negative for night sweats, weight gain and weight loss.  HENT: Positive for mouth dryness and nose dryness. Negative for mouth sores, trouble swallowing and trouble swallowing.   Eyes: Positive for visual disturbance and dryness. Negative for pain, redness and itching.  Respiratory: Negative for cough, hemoptysis, shortness of breath and difficulty breathing.   Cardiovascular: Negative for chest pain, palpitations, hypertension, irregular heartbeat and swelling in legs/feet.  Gastrointestinal: Positive for constipation. Negative for abdominal pain, blood in stool and diarrhea.  Endocrine: Negative for increased urination.  Genitourinary: Negative for painful urination and vaginal dryness.  Musculoskeletal: Positive for arthralgias, joint pain, morning stiffness and muscle tenderness. Negative for joint swelling, myalgias, muscle weakness and myalgias.  Skin: Negative for  color change, rash, hair loss, redness, skin tightness, ulcers and sensitivity to sunlight.  Allergic/Immunologic: Negative for susceptible to infections.  Neurological: Negative for dizziness, numbness, headaches, memory loss, night sweats and weakness.  Hematological: Negative for swollen glands.  Psychiatric/Behavioral: Negative for depressed mood, confusion and sleep disturbance. The patient is not nervous/anxious.     PMFS History:  Patient Active Problem List   Diagnosis Date Noted  . MVP (mitral valve prolapse) 09/07/2020  . Yeast infection 04/20/2020  . RAD (reactive airway disease) 12/29/2019  . Elevated TSH 09/09/2019  . Cough 03/17/2018  . Cardiac murmur 01/07/2018  . Osteoporosis 08/18/2017  . Hyperglycemia 07/30/2017  . Venous stasis dermatitis of both lower extremities 04/09/2017  . Spondylolisthesis at L4-L5 level 02/26/2017  . Fibromyalgia 01/03/2017  . Primary osteoarthritis of both hands 01/03/2017  . Trochanteric bursitis of both hips 01/03/2017  . Hypertension 12/29/2016  . 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 Lumbar radiculopathy 09/04/2016  . 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 Abnormal EKG 02/05/2012  . Atrophic vaginitis   . Basal cell cancer 04/24/2011  . DIVERTICULOSIS, COLON 06/09/2009  . Hyperlipidemia 05/31/2008  . IRRITABLE BOWEL SYNDROME 05/27/2008  . Chronic fatigue syndrome 05/27/2008    Past Medical History:  Diagnosis Date  . Allergy   . Atrophic vaginitis   . Chest pain    negative  cardiac evaluation; Dr Percival Spanish  . Diverticulosis of colon 2005& 2010   FH colon cancer  . Fibromyalgia    Dr Estanislado Pandy  . GERD (gastroesophageal reflux disease)   . Hemorrhoid   . Hyperlipidemia   .  Hypertension   . IBS (irritable bowel syndrome)   . Migraine   . MVP (mitral valve prolapse)   . Osteopenia   . Pulmonary nodule 07/2006  . UTI (lower urinary tract infection) 2005   Citrobacter  koseri, hospitalized w/ kidney infection 1983    Family History  Problem Relation Age of Onset  . Heart attack Father 57       Died with multiple medical problems  . Colon cancer Father 38  . Diabetes Father   . Tuberculosis Paternal Grandmother   . Stroke Mother 47  . Hypothyroidism Mother   . Hypertension Mother   . Hypothyroidism Sister   . Hypertension Sister   . Diabetes Sister   . Breast cancer Sister   . Diabetes Sister   . Diabetes Sister   . Heart attack Sister        2 sisters > 39  . Diabetes Sister   . Diabetes Sister   . Diabetes Sister   . Colon polyps Neg Hx   . Esophageal cancer Neg Hx   . Rectal cancer Neg Hx   . Stomach cancer Neg Hx    Past Surgical History:  Procedure Laterality Date  . back surgy  02/26/2017   lumbar 4-5  . BREAST BIOPSY Left 2003  . [place]   with bladder tack   . CATARACT EXTRACTION, BILATERAL  05/2019  . COLONOSCOPY  07/2014   negative; Dr Olevia Perches  . FOOT SURGERY Left 2014   Social History   Social History Narrative   Regular exercise- yes: walks, but not as much due to knee arthritis.  Does stretching   Lives with husband.         Immunization History  Administered Date(s) Administered  . Fluad Quad(high Dose 65+) 07/01/2019  . Influenza Whole 07/09/2007, 06/21/2010, 07/25/2012, 07/24/2013  . Influenza, High Dose Seasonal PF 07/20/2014, 07/04/2015, 07/07/2018, 07/07/2018  . Influenza,inj,Quad PF,6+ Mos 06/20/2017  . Influenza-Unspecified 07/03/2016  . Moderna Sars-Covid-2 Vaccination 11/06/2019, 12/04/2019  . Pneumococcal Conjugate-13 08/31/2014, 11/15/2015  . Pneumococcal Polysaccharide-23 05/27/2008, 10/21/2013  . Td 01/01/2008  . Zoster 09/24/2008  . Zoster Recombinat (Shingrix) 02/06/2018, 04/11/2018     Objective: Vital Signs: BP 117/78 (BP Location: Left Arm, Patient Position: Sitting, Cuff Size: Normal)   Pulse 61   Ht 5\' 1"  (1.549 m)   Wt 137 lb 3.2 oz (62.2 kg)   BMI 25.92 kg/m     Physical Exam Vitals and nursing note reviewed.  Constitutional:      Appearance: She is well-developed.  HENT:     Head: Normocephalic and atraumatic.  Eyes:     Conjunctiva/sclera: Conjunctivae normal.  Cardiovascular:     Rate and Rhythm: Normal rate and regular rhythm.     Heart sounds: Normal heart sounds.  Pulmonary:     Effort: Pulmonary effort is normal.     Breath sounds: Normal breath sounds.  Abdominal:     General: Bowel sounds are normal.     Palpations: Abdomen is soft.  Musculoskeletal:     Cervical back: Normal range of motion.  Lymphadenopathy:     Cervical: No cervical adenopathy.  Skin:    General: Skin is warm and dry.     Capillary Refill: Capillary refill takes less than 2 seconds.  Neurological:     Mental Status: She is alert and oriented to person, place, and time.  Psychiatric:        Behavior: Behavior normal.  Musculoskeletal Exam: C-spine was in good range of motion.  She has limited range of motion of her lumbar spine with discomfort.  Shoulder joints, elbow joints, wrist joints with good range of motion.  She had bilateral PIP and DIP thickening.  Hip joints, knee joints, ankles with good range of motion.  She had no tenderness over MTPs or PIPs.  No synovitis was noted.  She has some positive tender points.  CDAI Exam: CDAI Score: -- Patient Global: --; Provider Global: -- Swollen: --; Tender: -- Joint Exam 02/06/2021   No joint exam has been documented for this visit   There is currently no information documented on the homunculus. Go to the Rheumatology activity and complete the homunculus joint exam.  Investigation: No additional findings.  Imaging: No results found.  Recent Labs: Lab Results  Component Value Date   WBC 5.9 03/22/2020   HGB 13.4 03/22/2020   PLT 220.0 03/22/2020   NA 134 (L) 03/22/2020   K 4.0 03/22/2020   CL 99 03/22/2020   CO2 27 03/22/2020   GLUCOSE 97 03/22/2020   BUN 14 03/22/2020    CREATININE 0.80 03/22/2020   BILITOT 0.6 03/22/2020   ALKPHOS 47 03/22/2020   AST 21 03/22/2020   ALT 19 03/22/2020   PROT 6.9 03/22/2020   ALBUMIN 4.6 03/22/2020   CALCIUM 9.4 03/22/2020   GFRAA >60 02/27/2017    Speciality Comments: No specialty comments available.  Procedures:  No procedures performed Allergies: Clarithromycin, Clindamycin, Levofloxacin, Tramadol, Actonel [risedronate sodium], Atorvastatin, Bactroban [mupirocin calcium], and Risedronate sodium   Assessment / Plan:     Visit Diagnoses: Fibromyalgia-she continues to have some generalized pain and discomfort.  She has chronic insomnia and fatigue.  Need for regular exercise was emphasized.  Primary insomnia-good sleep hygiene was discussed.  Other fatigue-related to insomnia and fibromyalgia.  Primary osteoarthritis of both hands-she has bilateral PIP and DIP thickening.  Joint protection muscle strengthening was discussed.  Some of the exercises were demonstrated in the office and a handout was given.  Spondylolisthesis at L4-L5 level  Spinal stenosis of lumbar region with neurogenic claudication-she continues to have some lower back pain.  Core strengthening exercises were demonstrated and also a handout was given.  History of osteopenia-calcium rich diet was discussed.  She gets bone density through her PCP.  History of hypertension-blood pressure is normal today.  Other medical problems are listed as follows:  History of hyperlipidemia  History of IBS  History of diverticulosis  Venous stasis dermatitis of both lower extremities  PVNS (pigmented villonodular synovitis)  History of basal cell cancer  Orders: No orders of the defined types were placed in this encounter.  No orders of the defined types were placed in this encounter.   Follow-Up Instructions: Return in about 6 months (around 08/09/2021) for Osteoarthritis,FMS.   Bo Merino, MD  Note - This record has been created using  Editor, commissioning.  Chart creation errors have been sought, but may not always  have been located. Such creation errors do not reflect on  the standard of medical care.

## 2021-02-06 ENCOUNTER — Other Ambulatory Visit: Payer: Self-pay

## 2021-02-06 ENCOUNTER — Ambulatory Visit: Payer: PPO | Admitting: Rheumatology

## 2021-02-06 ENCOUNTER — Encounter: Payer: Self-pay | Admitting: Rheumatology

## 2021-02-06 VITALS — BP 117/78 | HR 61 | Ht 61.0 in | Wt 137.2 lb

## 2021-02-06 DIAGNOSIS — Z8739 Personal history of other diseases of the musculoskeletal system and connective tissue: Secondary | ICD-10-CM | POA: Diagnosis not present

## 2021-02-06 DIAGNOSIS — F5101 Primary insomnia: Secondary | ICD-10-CM | POA: Diagnosis not present

## 2021-02-06 DIAGNOSIS — Z85828 Personal history of other malignant neoplasm of skin: Secondary | ICD-10-CM

## 2021-02-06 DIAGNOSIS — M48062 Spinal stenosis, lumbar region with neurogenic claudication: Secondary | ICD-10-CM | POA: Diagnosis not present

## 2021-02-06 DIAGNOSIS — Z8719 Personal history of other diseases of the digestive system: Secondary | ICD-10-CM | POA: Diagnosis not present

## 2021-02-06 DIAGNOSIS — M1220 Villonodular synovitis (pigmented), unspecified site: Secondary | ICD-10-CM | POA: Diagnosis not present

## 2021-02-06 DIAGNOSIS — M4316 Spondylolisthesis, lumbar region: Secondary | ICD-10-CM

## 2021-02-06 DIAGNOSIS — Z8639 Personal history of other endocrine, nutritional and metabolic disease: Secondary | ICD-10-CM | POA: Diagnosis not present

## 2021-02-06 DIAGNOSIS — M797 Fibromyalgia: Secondary | ICD-10-CM

## 2021-02-06 DIAGNOSIS — I872 Venous insufficiency (chronic) (peripheral): Secondary | ICD-10-CM

## 2021-02-06 DIAGNOSIS — M19042 Primary osteoarthritis, left hand: Secondary | ICD-10-CM

## 2021-02-06 DIAGNOSIS — R5383 Other fatigue: Secondary | ICD-10-CM

## 2021-02-06 DIAGNOSIS — M19041 Primary osteoarthritis, right hand: Secondary | ICD-10-CM | POA: Diagnosis not present

## 2021-02-06 DIAGNOSIS — Z8679 Personal history of other diseases of the circulatory system: Secondary | ICD-10-CM

## 2021-02-06 NOTE — Patient Instructions (Signed)
Back Exercises The following exercises strengthen the muscles that help to support the trunk and back. They also help to keep the lower back flexible. Doing these exercises can help to prevent back pain or lessen existing pain.  If you have back pain or discomfort, try doing these exercises 2-3 times each day or as told by your health care provider.  As your pain improves, do them once each day, but increase the number of times that you repeat the steps for each exercise (do more repetitions).  To prevent the recurrence of back pain, continue to do these exercises once each day or as told by your health care provider. Do exercises exactly as told by your health care provider and adjust them as directed. It is normal to feel mild stretching, pulling, tightness, or discomfort as you do these exercises, but you should stop right away if you feel sudden pain or your pain gets worse. Exercises Single knee to chest Repeat these steps 3-5 times for each leg: 1. Lie on your back on a firm bed or the floor with your legs extended. 2. Bring one knee to your chest. Your other leg should stay extended and in contact with the floor. 3. Hold your knee in place by grabbing your knee or thigh with both hands and hold. 4. Pull on your knee until you feel a gentle stretch in your lower back or buttocks. 5. Hold the stretch for 10-30 seconds. 6. Slowly release and straighten your leg. Pelvic tilt Repeat these steps 5-10 times: 1. Lie on your back on a firm bed or the floor with your legs extended. 2. Bend your knees so they are pointing toward the ceiling and your feet are flat on the floor. 3. Tighten your lower abdominal muscles to press your lower back against the floor. This motion will tilt your pelvis so your tailbone points up toward the ceiling instead of pointing to your feet or the floor. 4. With gentle tension and even breathing, hold this position for 5-10 seconds. Cat-cow Repeat these steps until  your lower back becomes more flexible: 1. Get into a hands-and-knees position on a firm surface. Keep your hands under your shoulders, and keep your knees under your hips. You may place padding under your knees for comfort. 2. Let your head hang down toward your chest. Contract your abdominal muscles and point your tailbone toward the floor so your lower back becomes rounded like the back of a cat. 3. Hold this position for 5 seconds. 4. Slowly lift your head, let your abdominal muscles relax and point your tailbone up toward the ceiling so your back forms a sagging arch like the back of a cow. 5. Hold this position for 5 seconds.   Press-ups Repeat these steps 5-10 times: 1. Lie on your abdomen (face-down) on the floor. 2. Place your palms near your head, about shoulder-width apart. 3. Keeping your back as relaxed as possible and keeping your hips on the floor, slowly straighten your arms to raise the top half of your body and lift your shoulders. Do not use your back muscles to raise your upper torso. You may adjust the placement of your hands to make yourself more comfortable. 4. Hold this position for 5 seconds while you keep your back relaxed. 5. Slowly return to lying flat on the floor.   Bridges Repeat these steps 10 times: 1. Lie on your back on a firm surface. 2. Bend your knees so they are pointing toward the   ceiling and your feet are flat on the floor. Your arms should be flat at your sides, next to your body. 3. Tighten your buttocks muscles and lift your buttocks off the floor until your waist is at almost the same height as your knees. You should feel the muscles working in your buttocks and the back of your thighs. If you do not feel these muscles, slide your feet 1-2 inches farther away from your buttocks. 4. Hold this position for 3-5 seconds. 5. Slowly lower your hips to the starting position, and allow your buttocks muscles to relax completely. If this exercise is too easy, try  doing it with your arms crossed over your chest.   Abdominal crunches Repeat these steps 5-10 times: 1. Lie on your back on a firm bed or the floor with your legs extended. 2. Bend your knees so they are pointing toward the ceiling and your feet are flat on the floor. 3. Cross your arms over your chest. 4. Tip your chin slightly toward your chest without bending your neck. 5. Tighten your abdominal muscles and slowly raise your trunk (torso) high enough to lift your shoulder blades a tiny bit off the floor. Avoid raising your torso higher than that because it can put too much stress on your low back and does not help to strengthen your abdominal muscles. 6. Slowly return to your starting position. Back lifts Repeat these steps 5-10 times: 1. Lie on your abdomen (face-down) with your arms at your sides, and rest your forehead on the floor. 2. Tighten the muscles in your legs and your buttocks. 3. Slowly lift your chest off the floor while you keep your hips pressed to the floor. Keep the back of your head in line with the curve in your back. Your eyes should be looking at the floor. 4. Hold this position for 3-5 seconds. 5. Slowly return to your starting position. Contact a health care provider if:  Your back pain or discomfort gets much worse when you do an exercise.  Your worsening back pain or discomfort does not lessen within 2 hours after you exercise. If you have any of these problems, stop doing these exercises right away. Do not do them again unless your health care provider says that you can. Get help right away if:  You develop sudden, severe back pain. If this happens, stop doing the exercises right away. Do not do them again unless your health care provider says that you can. This information is not intended to replace advice given to you by your health care provider. Make sure you discuss any questions you have with your health care provider. Document Revised: 01/15/2019  Document Reviewed: 06/12/2018 Elsevier Patient Education  2021 [place]. Hand Exercises Hand exercises can be helpful for almost anyone. These exercises can strengthen the hands, improve flexibility and movement, and increase blood flow to the hands. These results can make work and daily tasks easier. Hand exercises can be especially helpful for people who have joint pain from arthritis or have nerve damage from overuse (carpal tunnel syndrome). These exercises can also help people who have injured a hand. Exercises Most of these hand exercises are gentle stretching and motion exercises. It is usually safe to do them often throughout the day. Warming up your hands before exercise may help to reduce stiffness. You can do this with gentle massage or by placing your hands in warm water for 10-15 minutes. It is normal to feel some stretching, pulling, tightness,  or mild discomfort as you begin new exercises. This will gradually improve. Stop an exercise right away if you feel sudden, severe pain or your pain gets worse. Ask your health care provider which exercises are best for you. Knuckle bend or "claw" fist 1. Stand or sit with your arm, hand, and all five fingers pointed straight up. Make sure to keep your wrist straight during the exercise. 2. Gently bend your fingers down toward your palm until the tips of your fingers are touching the top of your palm. Keep your big knuckle straight and just bend the small knuckles in your fingers. 3. Hold this position for __________ seconds. 4. Straighten (extend) your fingers back to the starting position. Repeat this exercise 5-10 times with each hand. Full finger fist 1. Stand or sit with your arm, hand, and all five fingers pointed straight up. Make sure to keep your wrist straight during the exercise. 2. Gently bend your fingers into your palm until the tips of your fingers are touching the middle of your palm. 3. Hold this position for __________  seconds. 4. Extend your fingers back to the starting position, stretching every joint fully. Repeat this exercise 5-10 times with each hand. Straight fist 1. Stand or sit with your arm, hand, and all five fingers pointed straight up. Make sure to keep your wrist straight during the exercise. 2. Gently bend your fingers at the big knuckle, where your fingers meet your hand, and the middle knuckle. Keep the knuckle at the tips of your fingers straight and try to touch the bottom of your palm. 3. Hold this position for __________ seconds. 4. Extend your fingers back to the starting position, stretching every joint fully. Repeat this exercise 5-10 times with each hand. Tabletop 1. Stand or sit with your arm, hand, and all five fingers pointed straight up. Make sure to keep your wrist straight during the exercise. 2. Gently bend your fingers at the big knuckle, where your fingers meet your hand, as far down as you can while keeping the small knuckles in your fingers straight. Think of forming a tabletop with your fingers. 3. Hold this position for __________ seconds. 4. Extend your fingers back to the starting position, stretching every joint fully. Repeat this exercise 5-10 times with each hand. Finger spread 1. Place your hand flat on a table with your palm facing down. Make sure your wrist stays straight as you do this exercise. 2. Spread your fingers and thumb apart from each other as far as you can until you feel a gentle stretch. Hold this position for __________ seconds. 3. Bring your fingers and thumb tight together again. Hold this position for __________ seconds. Repeat this exercise 5-10 times with each hand. Making circles 1. Stand or sit with your arm, hand, and all five fingers pointed straight up. Make sure to keep your wrist straight during the exercise. 2. Make a circle by touching the tip of your thumb to the tip of your index finger. 3. Hold for __________ seconds. Then open your  hand wide. 4. Repeat this motion with your thumb and each finger on your hand. Repeat this exercise 5-10 times with each hand. Thumb motion 1. Sit with your forearm resting on a table and your wrist straight. Your thumb should be facing up toward the ceiling. Keep your fingers relaxed as you move your thumb. 2. Lift your thumb up as high as you can toward the ceiling. Hold for __________ seconds. 3. Bend your thumb  across your palm as far as you can, reaching the tip of your thumb for the small finger (pinkie) side of your palm. Hold for __________ seconds. Repeat this exercise 5-10 times with each hand. Grip strengthening 1. Hold a stress ball or other soft ball in the middle of your hand. 2. Slowly increase the pressure, squeezing the ball as much as you can without causing pain. Think of bringing the tips of your fingers into the middle of your palm. All of your finger joints should bend when doing this exercise. 3. Hold your squeeze for __________ seconds, then relax. Repeat this exercise 5-10 times with each hand.   Contact a health care provider if:  Your hand pain or discomfort gets much worse when you do an exercise.  Your hand pain or discomfort does not improve within 2 hours after you exercise. If you have any of these problems, stop doing these exercises right away. Do not do them again unless your health care provider says that you can. Get help right away if:  You develop sudden, severe hand pain or swelling. If this happens, stop doing these exercises right away. Do not do them again unless your health care provider says that you can. This information is not intended to replace advice given to you by your health care provider. Make sure you discuss any questions you have with your health care provider. Document Revised: 01/01/2019 Document Reviewed: 09/11/2018 Elsevier Patient Education  2021 [employer].

## 2021-04-07 ENCOUNTER — Telehealth: Payer: Self-pay | Admitting: *Deleted

## 2021-04-07 DIAGNOSIS — M25571 Pain in right ankle and joints of right foot: Secondary | ICD-10-CM | POA: Diagnosis not present

## 2021-04-07 DIAGNOSIS — M79671 Pain in right foot: Secondary | ICD-10-CM | POA: Diagnosis not present

## 2021-04-07 DIAGNOSIS — M7741 Metatarsalgia, right foot: Secondary | ICD-10-CM | POA: Diagnosis not present

## 2021-04-07 NOTE — Telephone Encounter (Signed)
Patient contacted the office and states she thinks she may have fractured her right foot. Patient states she has been having pain for 1 week. Patient states she is having trouble walking. Patient advised she should make an appointment with orthopedist for evaluation and treatment. Patient expressed understanding.

## 2021-04-10 DIAGNOSIS — Z20828 Contact with and (suspected) exposure to other viral communicable diseases: Secondary | ICD-10-CM | POA: Diagnosis not present

## 2021-04-10 DIAGNOSIS — R059 Cough, unspecified: Secondary | ICD-10-CM | POA: Diagnosis not present

## 2021-04-10 DIAGNOSIS — R110 Nausea: Secondary | ICD-10-CM | POA: Diagnosis not present

## 2021-04-10 DIAGNOSIS — R5381 Other malaise: Secondary | ICD-10-CM | POA: Diagnosis not present

## 2021-04-10 DIAGNOSIS — J029 Acute pharyngitis, unspecified: Secondary | ICD-10-CM | POA: Diagnosis not present

## 2021-04-10 DIAGNOSIS — R509 Fever, unspecified: Secondary | ICD-10-CM | POA: Diagnosis not present

## 2021-04-17 ENCOUNTER — Other Ambulatory Visit: Payer: Self-pay | Admitting: Internal Medicine

## 2021-04-21 DIAGNOSIS — M79671 Pain in right foot: Secondary | ICD-10-CM | POA: Diagnosis not present

## 2021-05-19 DIAGNOSIS — S92324D Nondisplaced fracture of second metatarsal bone, right foot, subsequent encounter for fracture with routine healing: Secondary | ICD-10-CM | POA: Diagnosis not present

## 2021-05-19 DIAGNOSIS — M79671 Pain in right foot: Secondary | ICD-10-CM | POA: Diagnosis not present

## 2021-06-02 ENCOUNTER — Ambulatory Visit (INDEPENDENT_AMBULATORY_CARE_PROVIDER_SITE_OTHER): Payer: PPO | Admitting: *Deleted

## 2021-06-02 ENCOUNTER — Other Ambulatory Visit: Payer: Self-pay | Admitting: *Deleted

## 2021-06-02 DIAGNOSIS — Z0000 Encounter for general adult medical examination without abnormal findings: Secondary | ICD-10-CM

## 2021-06-02 DIAGNOSIS — Z1231 Encounter for screening mammogram for malignant neoplasm of breast: Secondary | ICD-10-CM

## 2021-06-02 MED ORDER — AMLODIPINE BESYLATE 2.5 MG PO TABS: 2.5000 mg | ORAL_TABLET | Freq: Every day | ORAL | 0 refills | Status: DC

## 2021-06-02 MED ORDER — LOSARTAN POTASSIUM 50 MG PO TABS: 50.0000 mg | ORAL_TABLET | Freq: Every day | ORAL | 0 refills | Status: DC

## 2021-06-02 NOTE — Progress Notes (Signed)
Subjective:   Heather Ramsey is a 75 y.o. female who presents for Medicare Annual (Subsequent) preventive examination.  I connected with  [name] on 06/02/21 by audio enabled telemedicine application and verified that I am speaking with the correct person using two identifiers.   I discussed the limitations of evaluation and management by telemedicine. The patient expressed understanding and agreed to proceed.   Location of patient: Home Location of Provider: Office Persons participating in visit: Heather Ramsey (patient) & Jari Favre, CMA  Review of Systems    Defer to PCP Cardiac Risk Factors include: none     Objective:    There were no vitals filed for this visit. There is no height or weight on file to calculate BMI.  Advanced Directives 06/02/2021 06/10/2019 05/07/2018 05/02/2017 02/20/2017 07/03/2016 07/22/2014  Does Patient Have a Medical Advance Directive? Yes Yes Yes Yes Yes Yes Yes  Type of Paramedic of [place];Living will [place];Living will [place];Living will [place];Living will Living will [place];Living will [place];Living will  Does patient want to make changes to medical advance directive? No - Patient declined - - - No - Patient declined No - Patient declined -  Copy of [place] in Chart? Yes - validated most recent copy scanned in chart (See row information) No - copy requested No - copy requested No - copy requested - No - copy requested -    Current Medications (verified) Outpatient Encounter Medications as of 06/02/2021  Medication Sig   b complex vitamins tablet Take 1 tablet by mouth daily.   Calcium Carbonate-Vitamin D (CALCIUM + D PO) Take 1 tablet by mouth 2 (two) times daily.    Dietary Management Product (VASCULERA) TABS TAKE ONE TABLET BY MOUTH EVERY DAY   Docosahexaenoic Acid (DHA ALGAL-900 PO) Take  900 mg by mouth daily.   fish oil-omega-3 fatty acids 1000 MG capsule Take 1 g by mouth daily.    Ginger, Zingiber officinalis, (GINGER ROOT) 550 MG CAPS Take 1 capsule by mouth daily.   loratadine (CLARITIN) 10 MG tablet Take 10 mg by mouth daily.   magnesium 30 MG tablet Take 30 mg by mouth 2 (two) times daily.   Misc Natural Products (TART CHERRY ADVANCED PO) Take by mouth daily.   Probiotic Product (PROBIOTIC-10 PO) Take by mouth daily.   Psyllium (METAMUCIL) 28.3 % POWD Take 1 scoop dissolved in at least 8 ounces water/juice and drink twice daily (Patient taking differently: daily. Take 1 scoop dissolved in at least 8 ounces water/juice and drink twice daily)   TURMERIC PO Take 400 mg by mouth daily.   [DISCONTINUED] amLODipine (NORVASC) 2.5 MG tablet Take 1 tablet by mouth once daily   [DISCONTINUED] losartan (COZAAR) 50 MG tablet Take 1 tablet by mouth once daily   No facility-administered encounter medications on file as of 06/02/2021.    Allergies (verified) Clarithromycin, Clindamycin, Levofloxacin, Tramadol, Actonel [risedronate sodium], Atorvastatin, Bactroban [mupirocin calcium], and Risedronate sodium   History: Past Medical History:  Diagnosis Date   Allergy    Atrophic vaginitis    Chest pain    negative  cardiac evaluation; Dr Percival Spanish   Diverticulosis of colon 2005& 2010   FH colon cancer   Fibromyalgia    Dr Estanislado Pandy   GERD (gastroesophageal reflux disease)    Hemorrhoid    Hyperlipidemia    Hypertension    IBS (irritable bowel syndrome)  Migraine    MVP (mitral valve prolapse)    Osteopenia    Pulmonary nodule 07/2006   UTI (lower urinary tract infection) 2005   Citrobacter koseri, hospitalized w/ kidney infection 1983   Past Surgical History:  Procedure Laterality Date   back surgy  02/26/2017   lumbar 4-5   BREAST BIOPSY Left 2003   [place]   with bladder tack    CATARACT EXTRACTION, BILATERAL  05/2019   COLONOSCOPY  07/2014    negative; Dr Olevia Perches   FOOT SURGERY Left 2014   Family History  Problem Relation Age of Onset   Heart attack Father 73       Died with multiple medical problems   Colon cancer Father 59   Diabetes Father    Tuberculosis Paternal 45    Stroke Mother 25   Hypothyroidism Mother    Hypertension Mother    Hypothyroidism Sister    Hypertension Sister    Diabetes Sister    Breast cancer Sister    Diabetes Sister    Diabetes Sister    Heart attack Sister        2 sisters > 106   Diabetes Sister    Diabetes Sister    Diabetes Sister    Colon polyps Neg Hx    Esophageal cancer Neg Hx    Rectal cancer Neg Hx    Stomach cancer Neg Hx    Social History   Socioeconomic History   Marital status: Married    Spouse name: Not on file   Number of children: 2   Years of education: Not on file   Highest education level: Not on file  Occupational History   Occupation: retired  Tobacco Use   Smoking status: Never   Smokeless tobacco: Never  Vaping Use   Vaping Use: Never used  Substance and Sexual Activity   Alcohol use: No   Drug use: No   Sexual activity: Yes    Birth control/protection: Post-menopausal  Other Topics Concern   Not on file  Social History Narrative   Regular exercise- yes: walks, but not as much due to knee arthritis.  Does stretching   Lives with husband.         Social Determinants of Health   Financial Resource Strain: Low Risk    Difficulty of Paying Living Expenses: Not hard at all  Food Insecurity: No Food Insecurity   Worried About Charity fundraiser in the Last Year: Never true   [place] in the Last Year: Never true  Transportation Needs: No Transportation Needs   Lack of Transportation (Medical): No   Lack of Transportation (Non-Medical): No  Physical Activity: Sufficiently Active   Days of Exercise per Week: 4 days   Minutes of Exercise per Session: 50 min  Stress: No Stress Concern Present   Feeling of Stress : Not at all   Social Connections: Moderately Integrated   Frequency of Communication with Friends and Family: More than three times a week   Frequency of Social Gatherings with Friends and Family: Once a week   Attends Religious Services: More than 4 times per year   Active Member of [employer] or Organizations: No   Attends Archivist Meetings: Never   Marital Status: Married    Tobacco Counseling Counseling given: Not Answered   Clinical Intake:                 Diabetic? No  Activities of Daily Living In your present state of health, do you have any difficulty performing the following activities: 06/02/2021  Hearing? N  Vision? N  Difficulty concentrating or making decisions? N  Walking or climbing stairs? N  Doing errands, shopping? N  Preparing Food and eating ? N  Using the Toilet? N  In the past six months, have you accidently leaked urine? Y  Do you have problems with loss of bowel control? N  Managing your Medications? N  Managing your Finances? N  Housekeeping or managing your Housekeeping? N  Some recent data might be hidden    Patient Care Team: Binnie Rail, MD as PCP - General (Internal Medicine) Kristeen Miss, MD as Consulting Physician (Neurosurgery)  Indicate any recent Medical Services you may have received from other than [facility] providers in the past year (date may be approximate).     Assessment:   This is a routine wellness examination for Lashann.  Hearing/Vision screen No results found.  Dietary issues and exercise activities discussed: Current Exercise Habits: Home exercise routine, Type of exercise: walking, Time (Minutes): 35, Frequency (Times/Week): 4, Weekly Exercise (Minutes/Week): 140, Intensity: Moderate   Goals Addressed   None    Depression Screen PHQ 2/9 Scores 06/02/2021 06/10/2019 03/10/2019 05/07/2018 07/30/2017 05/02/2017 11/26/2016  PHQ - 2 Score 0 1 0 1 0 0 0  PHQ- 9 Score - 4 - - - 1 -    Fall Risk Fall Risk   06/02/2021 06/10/2019 03/10/2019 05/07/2018 07/30/2017  Falls in the past year? 0 0 0 No No  Number falls in past yr: 1 0 0 - -  Injury with Fall? 0 0 - - -    FALL RISK PREVENTION PERTAINING TO THE HOME:  Any stairs in or around the home? Yes  If so, are there any without handrails? No  Home free of loose throw rugs in walkways, pet beds, electrical cords, etc? Yes  Adequate lighting in your home to reduce risk of falls? Yes   ASSISTIVE DEVICES UTILIZED TO PREVENT FALLS:  Life alert? No  Use of a cane, walker or w/c? No  Grab bars in the bathroom? Yes  Shower chair or bench in shower? Yes  Elevated toilet seat or a handicapped toilet? Yes   TIMED UP AND GO:  Was the test performed? No .    Cognitive Function:        Immunizations Immunization History  Administered Date(s) Administered   Fluad Quad(high Dose 65+) 07/01/2019   Influenza Whole 07/09/2007, 06/21/2010, 07/25/2012, 07/24/2013   Influenza, High Dose Seasonal PF 07/20/2014, 07/04/2015, 07/07/2018, 07/07/2018   Influenza,inj,Quad PF,6+ Mos 06/20/2017   Influenza-Unspecified 07/03/2016   Moderna Sars-Covid-2 Vaccination 11/06/2019, 12/04/2019   Pneumococcal Conjugate-13 08/31/2014, 11/15/2015   Pneumococcal Polysaccharide-23 05/27/2008, 10/21/2013   Td 01/01/2008   Zoster Recombinat (Shingrix) 02/06/2018, 04/11/2018   Zoster, Live 09/24/2008    TDAP status: Due, Education has been provided regarding the importance of this vaccine. Advised may receive this vaccine at local pharmacy or Health Dept. Aware to provide a copy of the vaccination record if obtained from local pharmacy or Health Dept. Verbalized acceptance and understanding.  Flu Vaccine status: Due, Education has been provided regarding the importance of this vaccine. Advised may receive this vaccine at local pharmacy or Health Dept. Aware to provide a copy of the vaccination record if obtained from local pharmacy or Health Dept. Verbalized acceptance and  understanding.  Pneumonia Vaccine: Completed  Covid-19 vaccine status: Information provided on how  to obtain vaccines.   Qualifies for Shingles Vaccine? Yes   Zostavax completed Yes   Shingrix Completed?: Yes  Screening Tests Health Maintenance  Topic Date Due   COVID-19 Vaccine (3 - Moderna risk series) 06/18/2021 (Originally 01/01/2020)   INFLUENZA VACCINE  12/22/2021 (Originally 04/24/2021)   DEXA SCAN  06/02/2022 (Originally 08/03/2019)   TETANUS/TDAP  06/02/2022 (Originally 12/31/2017)   MAMMOGRAM  08/23/2022   COLONOSCOPY (Pts 45-61yr Insurance coverage will need to be confirmed)  02/17/2025   Hepatitis C Screening  Completed   PNA vac Low Risk Adult  Completed   Zoster Vaccines- Shingrix  Completed   HPV VACCINES  Aged Out    Health Maintenance  There are no preventive care reminders to display for this patient.   Colorectal cancer screening: Type of screening: Colonoscopy. Completed 01/2020. Repeat every 5 years  Mammogram status: Ordered referral. Pt provided with contact info and advised to call to schedule appt.   Bone Density status: Completed 07/2017. Results reflect: Bone density results: OSTEOPOROSIS. Repeat every 2 years.  Lung Cancer Screening: (Low Dose CT Chest recommended if Age 74-80years, 30 pack-year currently smoking OR have quit w/in 15years.) does not qualify.     Additional Screening:  Hepatitis C Screening: does qualify; Completed   Vision Screening: Recommended annual ophthalmology exams for early detection of glaucoma and other disorders of the eye. Is the patient up to date with their annual eye exam?  Yes  Who is the provider or what is the name of the office in which the patient attends annual eye exams?  If pt is not established with a provider, would they like to be referred to a provider to establish care? No .   Dental Screening: Recommended annual dental exams for proper oral hygiene  Community Resource Referral / Chronic Care  Management: CRR required this visit?  No   CCM required this visit?  No      Plan:     I have personally reviewed and noted the following in the patient's chart:   Medical and social history Use of alcohol, tobacco or illicit drugs  Current medications and supplements including opioid prescriptions.  Functional ability and status Nutritional status Physical activity Advanced directives List of other physicians Hospitalizations, surgeries, and ER visits in previous 12 months Vitals Screenings to include cognitive, depression, and falls Referrals and appointments  In addition, I have reviewed and discussed with patient certain preventive protocols, quality metrics, and best practice recommendations. A written personalized care plan for preventive services as well as general preventive health recommendations were provided to patient.     LCannon Kettle CEldred  06/02/2021   Nurse Notes: 21 minutes non face to face   Ms. MAlroy Dust, Thank you for taking time to come for your Medicare Wellness Visit. I appreciate your ongoing commitment to your health goals. Please review the following plan we discussed and let me know if I can assist you in the future.   These are the goals we discussed:  Goals      Increase my physical activity as tolerated after back surgery.     Continue to be as healthy as possible, enjoy life, nature, family and church.     Patient Stated     Take time for me daily to read and relax. After the move, I will start to walk several times weekly.        This is a list of the screening recommended for you and due dates:  Health Maintenance  Topic Date Due   COVID-19 Vaccine (3 - Moderna risk series) 06/18/2021*   Flu Shot  12/22/2021*   DEXA scan (bone density measurement)  06/02/2022*   Tetanus Vaccine  06/02/2022*   Mammogram  08/23/2022   Colon Cancer Screening  02/17/2025   Hepatitis C Screening: USPSTF Recommendation to screen - Ages 18-79 yo.   Completed   Pneumonia vaccines  Completed   Zoster (Shingles) Vaccine  Completed   HPV Vaccine  Aged Out  *Topic was postponed. The date shown is not the original due date.

## 2021-06-02 NOTE — Patient Instructions (Signed)
Health Maintenance, Female Adopting a healthy lifestyle and getting preventive care are important in promoting health and wellness. Ask your health care provider about: The right schedule for you to have regular tests and exams. Things you can do on your own to prevent diseases and keep yourself healthy. What should I know about diet, weight, and exercise? Eat a healthy diet  Eat a diet that includes plenty of vegetables, fruits, low-fat dairy products, and lean protein. Do not eat a lot of foods that are high in solid fats, added sugars, or sodium. Maintain a healthy weight Body mass index (BMI) is used to identify weight problems. It estimates body fat based on height and weight. Your health care provider can help determine your BMI and help you achieve or maintain a healthy weight. Get regular exercise Get regular exercise. This is one of the most important things you can do for your health. Most adults should: Exercise for at least 150 minutes each week. The exercise should increase your heart rate and make you sweat (moderate-intensity exercise). Do strengthening exercises at least twice a week. This is in addition to the moderate-intensity exercise. Spend less time sitting. Even light physical activity can be beneficial. Watch cholesterol and blood lipids Have your blood tested for lipids and cholesterol at 74 years of age, then have this test every 5 years. Have your cholesterol levels checked more often if: Your lipid or cholesterol levels are high. You are older than 74 years of age. You are at high risk for heart disease. What should I know about cancer screening? Depending on your health history and family history, you may need to have cancer screening at various ages. This may include screening for: Breast cancer. Cervical cancer. Colorectal cancer. Skin cancer. Lung cancer. What should I know about heart disease, diabetes, and high blood pressure? Blood pressure and heart  disease High blood pressure causes heart disease and increases the risk of stroke. This is more likely to develop in people who have high blood pressure readings, are of African descent, or are overweight. Have your blood pressure checked: Every 3-5 years if you are 18-39 years of age. Every year if you are 40 years old or older. Diabetes Have regular diabetes screenings. This checks your fasting blood sugar level. Have the screening done: Once every three years after age 40 if you are at a normal weight and have a low risk for diabetes. More often and at a younger age if you are overweight or have a high risk for diabetes. What should I know about preventing infection? Hepatitis B If you have a higher risk for hepatitis B, you should be screened for this virus. Talk with your health care provider to find out if you are at risk for hepatitis B infection. Hepatitis C Testing is recommended for: Everyone born from 1945 through 1965. Anyone with known risk factors for hepatitis C. Sexually transmitted infections (STIs) Get screened for STIs, including gonorrhea and chlamydia, if: You are sexually active and are younger than 74 years of age. You are older than 74 years of age and your health care provider tells you that you are at risk for this type of infection. Your sexual activity has changed since you were last screened, and you are at increased risk for chlamydia or gonorrhea. Ask your health care provider if you are at risk. Ask your health care provider about whether you are at high risk for HIV. Your health care provider may recommend a prescription medicine   to help prevent HIV infection. If you choose to take medicine to prevent HIV, you should first get tested for HIV. You should then be tested every 3 months for as long as you are taking the medicine. Pregnancy If you are about to stop having your period (premenopausal) and you may become pregnant, seek counseling before you get  pregnant. Take 400 to 800 micrograms (mcg) of folic acid every day if you become pregnant. Ask for birth control (contraception) if you want to prevent pregnancy. Osteoporosis and menopause Osteoporosis is a disease in which the bones lose minerals and strength with aging. This can result in bone fractures. If you are 65 years old or older, or if you are at risk for osteoporosis and fractures, ask your health care provider if you should: Be screened for bone loss. Take a calcium or vitamin D supplement to lower your risk of fractures. Be given hormone replacement therapy (HRT) to treat symptoms of menopause. Follow these instructions at home: Lifestyle Do not use any products that contain nicotine or tobacco, such as cigarettes, e-cigarettes, and chewing tobacco. If you need help quitting, ask your health care provider. Do not use street drugs. Do not share needles. Ask your health care provider for help if you need support or information about quitting drugs. Alcohol use Do not drink alcohol if: Your health care provider tells you not to drink. You are pregnant, may be pregnant, or are planning to become pregnant. If you drink alcohol: Limit how much you use to 0-1 drink a day. Limit intake if you are breastfeeding. Be aware of how much alcohol is in your drink. In the [place] one drink equals one 12 oz bottle of beer (355 mL), one 5 oz glass of wine (148 mL), or one 1 oz glass of hard liquor (44 mL). General instructions Schedule regular health, dental, and eye exams. Stay current with your vaccines. Tell your health care provider if: You often feel depressed. You have ever been abused or do not feel safe at home. Summary Adopting a healthy lifestyle and getting preventive care are important in promoting health and wellness. Follow your health care provider's instructions about healthy diet, exercising, and getting tested or screened for diseases. Follow your health care provider's  instructions on monitoring your cholesterol and blood pressure. This information is not intended to replace advice given to you by your health care provider. Make sure you discuss any questions you have with your health care provider. Document Revised: 11/18/2020 Document Reviewed: 09/03/2018 Elsevier Patient Education  2022 Elsevier Inc.  

## 2021-06-05 DIAGNOSIS — L720 Epidermal cyst: Secondary | ICD-10-CM | POA: Diagnosis not present

## 2021-06-05 DIAGNOSIS — L918 Other hypertrophic disorders of the skin: Secondary | ICD-10-CM | POA: Diagnosis not present

## 2021-06-05 DIAGNOSIS — L814 Other melanin hyperpigmentation: Secondary | ICD-10-CM | POA: Diagnosis not present

## 2021-06-05 DIAGNOSIS — Z85828 Personal history of other malignant neoplasm of skin: Secondary | ICD-10-CM | POA: Diagnosis not present

## 2021-06-05 DIAGNOSIS — D225 Melanocytic nevi of trunk: Secondary | ICD-10-CM | POA: Diagnosis not present

## 2021-06-05 DIAGNOSIS — D2262 Melanocytic nevi of left upper limb, including shoulder: Secondary | ICD-10-CM | POA: Diagnosis not present

## 2021-06-05 DIAGNOSIS — D2272 Melanocytic nevi of left lower limb, including hip: Secondary | ICD-10-CM | POA: Diagnosis not present

## 2021-06-05 DIAGNOSIS — L821 Other seborrheic keratosis: Secondary | ICD-10-CM | POA: Diagnosis not present

## 2021-06-05 DIAGNOSIS — D1801 Hemangioma of skin and subcutaneous tissue: Secondary | ICD-10-CM | POA: Diagnosis not present

## 2021-06-12 DIAGNOSIS — H04123 Dry eye syndrome of bilateral lacrimal glands: Secondary | ICD-10-CM | POA: Diagnosis not present

## 2021-06-12 DIAGNOSIS — H524 Presbyopia: Secondary | ICD-10-CM | POA: Diagnosis not present

## 2021-06-12 DIAGNOSIS — H26492 Other secondary cataract, left eye: Secondary | ICD-10-CM | POA: Diagnosis not present

## 2021-06-12 DIAGNOSIS — H43813 Vitreous degeneration, bilateral: Secondary | ICD-10-CM | POA: Diagnosis not present

## 2021-06-19 DIAGNOSIS — M79671 Pain in right foot: Secondary | ICD-10-CM | POA: Diagnosis not present

## 2021-06-19 DIAGNOSIS — S92324D Nondisplaced fracture of second metatarsal bone, right foot, subsequent encounter for fracture with routine healing: Secondary | ICD-10-CM | POA: Diagnosis not present

## 2021-07-10 ENCOUNTER — Telehealth: Payer: Self-pay | Admitting: Internal Medicine

## 2021-07-10 NOTE — Telephone Encounter (Signed)
Patient wanted to make [place] aware that we would be getting a call from [place] regarding her Dietary Management Product (VASCULERA) TABS

## 2021-07-13 ENCOUNTER — Other Ambulatory Visit: Payer: Self-pay | Admitting: Internal Medicine

## 2021-07-13 DIAGNOSIS — I872 Venous insufficiency (chronic) (peripheral): Secondary | ICD-10-CM

## 2021-07-13 MED ORDER — VASCULERA PO TABS: 1.0000 | ORAL_TABLET | Freq: Every day | ORAL | 3 refills | Status: DC

## 2021-07-13 NOTE — Telephone Encounter (Signed)
Received today and placed in provider's folder to sign.

## 2021-08-16 ENCOUNTER — Other Ambulatory Visit: Payer: Self-pay | Admitting: Internal Medicine

## 2021-08-24 ENCOUNTER — Telehealth: Payer: Self-pay | Admitting: Internal Medicine

## 2021-08-24 NOTE — Telephone Encounter (Signed)
1.Medication Requested: losartan (COZAAR) 50 MG tablet  2. Pharmacy (Name, Street, [place]):  [facility], [address] Phone:  [phone]  Fax:  [phone]       3. On Med List: yes  4. Last Visit with PCP: 02.28.22  5. Next visit date with PCP: 01.17.23  **Patient is requesting 90DS be sent in as current rx does not have any future refills**   Agent: Please be advised that RX refills may take up to 3 business days. We ask that you follow-up with your pharmacy.

## 2021-08-25 ENCOUNTER — Other Ambulatory Visit: Payer: Self-pay

## 2021-08-25 MED ORDER — LOSARTAN POTASSIUM 50 MG PO TABS: 50.0000 mg | ORAL_TABLET | Freq: Every day | ORAL | 0 refills | Status: DC

## 2021-08-25 NOTE — Telephone Encounter (Signed)
Sent in today 

## 2021-09-20 DIAGNOSIS — Z1231 Encounter for screening mammogram for malignant neoplasm of breast: Secondary | ICD-10-CM | POA: Diagnosis not present

## 2021-10-04 DIAGNOSIS — R928 Other abnormal and inconclusive findings on diagnostic imaging of breast: Secondary | ICD-10-CM | POA: Diagnosis not present

## 2021-10-04 DIAGNOSIS — R922 Inconclusive mammogram: Secondary | ICD-10-CM | POA: Diagnosis not present

## 2021-10-04 LAB — HM MAMMOGRAPHY

## 2021-10-08 ENCOUNTER — Encounter: Payer: Self-pay | Admitting: Internal Medicine

## 2021-10-08 NOTE — Progress Notes (Signed)
Subjective:    Patient ID: Heather Ramsey, female    DOB: 01-24-1946, 76 y.o.   MRN: [id number]   This visit occurred during the SARS-CoV-2 public health emergency.  Safety protocols were in place, including screening questions prior to the visit, additional usage of staff PPE, and extensive cleaning of exam room while observing appropriate contact time as indicated for disinfecting solutions.    HPI She is here for a physical exam.    She sometimes has an odor to her urine.  It is intermittent.  She is not always drinking enough fluids.  She wonders if it is related to a juice that she drinks.   12/2 - started having N/V  - occurs every 4-5 days.  Can occur at anytime.  No relation.  ? Related to sweets.  She is not sure why she has it.  She had a little bit today.  She denies associated abdominal pain.  She does have alternating diarrhea and constipation, but that is not new-related to her IBS.    Medications and allergies reviewed with patient and updated if appropriate.  Patient Active Problem List   Diagnosis Date Noted   Thoracic aortic aneurysm, mild 10/10/2021   RAD (reactive airway disease) 12/29/2019   Cough 03/17/2018   Cardiac murmur - aortic valve thickening 01/07/2018   Osteoporosis 08/18/2017   Hyperglycemia 07/30/2017   Venous stasis dermatitis of both lower extremities 04/09/2017   Spondylolisthesis at L4-L5 level 02/26/2017   Fibromyalgia 01/03/2017   Primary osteoarthritis of both hands 01/03/2017   Trochanteric bursitis of both hips 01/03/2017   Hypertension 12/29/2016   Spinal stenosis of lumbar region with neurogenic claudication 09/04/2016   Lumbar radiculopathy 09/04/2016   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     b complex vitamins tablet Take 1 tablet by mouth daily.     Calcium Carbonate-Vitamin D (CALCIUM + D PO) Take 1 tablet by mouth 2 (two) times daily.      Dietary Management Product (VASCULERA) TABS Take 1 tablet by mouth daily. 90 tablet 3   Docosahexaenoic Acid (DHA ALGAL-900 PO) Take 900 mg by mouth daily.     fish oil-omega-3 fatty acids 1000 MG capsule Take 1 g by mouth daily.      Ginger, Zingiber officinalis, (GINGER ROOT) 550 MG CAPS Take 1 capsule by mouth daily.     loratadine (CLARITIN) 10 MG tablet Take 10 mg by mouth daily.     magnesium 30 MG tablet Take 30 mg by mouth 2 (two) times daily.     Misc Natural Products (TART CHERRY ADVANCED PO) Take by mouth daily.     Probiotic Product (PROBIOTIC-10 PO) Take by mouth daily.     Psyllium (METAMUCIL) 28.3 % POWD Take 1 scoop dissolved in at least 8 ounces water/juice and drink twice daily (Patient taking differently: daily. Take 1 scoop dissolved in at least 8 ounces water/juice and drink twice daily) 1 Bottle 0   TURMERIC PO Take 400 mg by mouth daily.     No current facility-administered medications on file prior to visit.    Past Medical History:  Diagnosis Date   Allergy  Atrophic vaginitis    Chest pain    negative  cardiac evaluation; Dr Percival Spanish   Diverticulosis of colon 2005& 2010   [place] colon cancer   Fibromyalgia    Dr Estanislado Pandy   GERD (gastroesophageal reflux disease)    Hemorrhoid    Hyperlipidemia    Hypertension    IBS (irritable bowel syndrome)    Migraine    MVP (mitral valve prolapse)    Osteopenia    Pulmonary nodule 07/2006   UTI (lower urinary tract infection) 2005   Citrobacter koseri, hospitalized w/ kidney infection 1983    Past Surgical History:  Procedure Laterality Date   back surgy  02/26/2017   lumbar 4-5   BREAST BIOPSY Left 2003   [place]   with bladder tack     CATARACT EXTRACTION, BILATERAL  05/2019   COLONOSCOPY  07/2014   negative; Dr Olevia Perches   FOOT SURGERY Left 2014    Social History   Socioeconomic History   Marital status: Married    Spouse name: Not on file   Number of children: 2   Years of education: Not on file   Highest education level: Not on file  Occupational History   Occupation: retired  Tobacco Use   Smoking status: Never   Smokeless tobacco: Never  Vaping Use   Vaping Use: Never used  Substance and Sexual Activity   Alcohol use: No   Drug use: No   Sexual activity: Yes    Birth control/protection: Post-menopausal  Other Topics Concern   Not on file  Social History Narrative   Regular exercise- yes: walks, but not as much due to knee arthritis.  Does stretching   Lives with husband.         Social Determinants of Health   Financial Resource Strain: Low Risk    Difficulty of Paying Living Expenses: Not hard at all  Food Insecurity: No Food Insecurity   Worried About Charity fundraiser in the Last Year: Never true   [place] in the Last Year: Never true  Transportation Needs: No Transportation Needs   Lack of Transportation (Medical): No   Lack of Transportation (Non-Medical): No  Physical Activity: Sufficiently Active   Days of Exercise per Week: 4 days   Minutes of Exercise per Session: 50 min  Stress: No Stress Concern Present   Feeling of Stress : Not at all  Social Connections: Moderately Integrated   Frequency of Communication with Friends and Family: More than three times a week   Frequency of Social Gatherings with Friends and Family: Once a week   Attends Religious Services: More than 4 times per year   Active Member of [employer] or Organizations: No   Attends Archivist Meetings: Never   Marital Status: Married    Family History  Problem Relation Age of Onset   Heart attack Father 49       Died with multiple medical problems   Colon cancer Father 2   Diabetes Father     Tuberculosis Paternal 53    Stroke Mother 47   Hypothyroidism Mother    Hypertension Mother    Hypothyroidism Sister    Hypertension Sister    Diabetes Sister    Breast cancer Sister    Diabetes Sister    Diabetes Sister    Heart attack Sister        2 sisters > 74   Diabetes Sister    Diabetes Sister  Diabetes Sister    Colon polyps Neg Hx    Esophageal cancer Neg Hx    Rectal cancer Neg Hx    Stomach cancer Neg Hx     Review of Systems  Constitutional:  Negative for chills and fever.  Eyes:  Negative for visual disturbance.  Respiratory:  Positive for shortness of breath (with walking). Negative for cough and wheezing.   Cardiovascular:  Negative for chest pain, palpitations and leg swelling.  Gastrointestinal:  Positive for abdominal pain (occ Left side), anal bleeding, constipation (IBS), diarrhea (IBS related chronic), nausea and vomiting. Negative for blood in stool.       No gerd  Genitourinary:  Positive for dysuria (occ). Negative for frequency and hematuria.       Intermittent odor  Musculoskeletal:  Positive for back pain (chronic). Negative for arthralgias.  Skin:  Negative for rash.  Neurological:  Negative for light-headedness and headaches.  Psychiatric/Behavioral:  Negative for dysphoric mood. The patient is nervous/anxious.       Objective:   Vitals:   10/10/21 0855  BP: 118/80  Pulse: 69  Temp: 98.2 F (36.8 C)  SpO2: 99%   Filed Weights   10/10/21 0855  Weight: 134 lb 6.4 oz (61 kg)   Body mass index is 25.39 kg/m.  BP Readings from Last 3 Encounters:  10/10/21 118/80  02/06/21 117/78  09/09/20 126/82    Wt Readings from Last 3 Encounters:  10/10/21 134 lb 6.4 oz (61 kg)  02/06/21 137 lb 3.2 oz (62.2 kg)  11/04/20 141 lb (64 kg)    Depression screen [facility] 2/9 06/02/2021 06/10/2019 03/10/2019 05/07/2018 07/30/2017  Decreased Interest 0 0 0 0 0  Down, Depressed, Hopeless 0 1 0 1 0  PHQ - 2 Score 0 1 0 1 0  Altered sleeping - 2 -  - -  Tired, decreased energy - 1 - - -  Change in appetite - 0 - - -  Feeling bad or failure about yourself  - 0 - - -  Trouble concentrating - 0 - - -  Moving slowly or fidgety/restless - 0 - - -  Suicidal thoughts - 0 - - -  PHQ-9 Score - 4 - - -  Difficult doing work/chores - Not difficult at all - - -     No flowsheet data found.     Physical Exam Constitutional: She appears well-developed and well-nourished. No distress.  HENT:  Head: Normocephalic and atraumatic.  Right Ear: External ear normal. Normal ear canal and TM Left Ear: External ear normal.  Normal ear canal and TM Mouth/Throat: Oropharynx is clear and moist.  Eyes: Conjunctivae and EOM are normal.  Neck: Neck supple. No tracheal deviation present. No thyromegaly present.  No carotid bruit  Cardiovascular: Normal rate, regular rhythm and normal heart sounds.   No murmur heard.  No edema. Pulmonary/Chest: Effort normal and breath sounds normal. No respiratory distress. She has no wheezes. She has no rales.  Breast: deferred   Abdominal: Soft. She exhibits no distension. There is no tenderness.  Lymphadenopathy: She has no cervical adenopathy.  Skin: Skin is warm and dry. She is not diaphoretic.  Psychiatric: She has a normal mood and affect. Her behavior is normal.     Lab Results  Component Value Date   WBC 7.3 10/10/2021   HGB 13.7 10/10/2021   HCT 41.6 10/10/2021   PLT 240.0 10/10/2021   GLUCOSE 92 10/10/2021   CHOL 259 (H) 10/10/2021   TRIG 91.0  10/10/2021   HDL 76.60 10/10/2021   LDLDIRECT 186.4 10/21/2013   LDLCALC 164 (H) 10/10/2021   ALT 21 10/10/2021   AST 22 10/10/2021   NA 139 10/10/2021   K 4.3 10/10/2021   CL 103 10/10/2021   CREATININE 0.82 10/10/2021   BUN 19 10/10/2021   CO2 27 10/10/2021   TSH 2.55 03/22/2020   HGBA1C 5.7 10/10/2021         Assessment & Plan:   Physical exam: Screening blood work  ordered Exercise  back exercise x 30 min, walks 3-4 days a week Weight   normal Substance abuse  none   Reviewed recommended immunizations.   Health Maintenance  Topic Date Due   COVID-19 Vaccine (3 - Moderna risk series) 01/01/2020   INFLUENZA VACCINE  12/22/2021 (Originally 04/24/2021)   DEXA SCAN  06/02/2022 (Originally 08/03/2019)   TETANUS/TDAP  06/02/2022 (Originally 12/31/2017)   COLONOSCOPY (Pts 45-33yrs Insurance coverage will need to be confirmed)  02/17/2025   Pneumonia Vaccine 92+ Years old  Completed   Hepatitis C Screening  Completed   Zoster Vaccines- Shingrix  Completed   HPV VACCINES  Aged Out          See Problem List for Assessment and Plan of chronic medical problems.

## 2021-10-08 NOTE — Patient Instructions (Addendum)
Blood work was ordered.     Medications changes include :   zofran for nausea if needed  Your prescription(s) have been submitted to your pharmacy. Please take as directed and contact our office if you believe you are having problem(s) with the medication(s).   A abdominal US was ordered.   Someone from their office will call you to schedule an appointment.    Please followup in 1 year   Health Maintenance, Female Adopting a healthy lifestyle and getting preventive care are important in promoting health and wellness. Ask your health care provider about: The right schedule for you to have regular tests and exams. Things you can do on your own to prevent diseases and keep yourself healthy. What should I know about diet, weight, and exercise? Eat a healthy diet  Eat a diet that includes plenty of vegetables, fruits, low-fat dairy products, and lean protein. Do not eat a lot of foods that are high in solid fats, added sugars, or sodium. Maintain a healthy weight Body mass index (BMI) is used to identify weight problems. It estimates body fat based on height and weight. Your health care provider can help determine your BMI and help you achieve or maintain a healthy weight. Get regular exercise Get regular exercise. This is one of the most important things you can do for your health. Most adults should: Exercise for at least 150 minutes each week. The exercise should increase your heart rate and make you sweat (moderate-intensity exercise). Do strengthening exercises at least twice a week. This is in addition to the moderate-intensity exercise. Spend less time sitting. Even light physical activity can be beneficial. Watch cholesterol and blood lipids Have your blood tested for lipids and cholesterol at 75 years of age, then have this test every 5 years. Have your cholesterol levels checked more often if: Your lipid or cholesterol levels are high. You are older than 75 years of age. You  are at high risk for heart disease. What should I know about cancer screening? Depending on your health history and family history, you may need to have cancer screening at various ages. This may include screening for: Breast cancer. Cervical cancer. Colorectal cancer. Skin cancer. Lung cancer. What should I know about heart disease, diabetes, and high blood pressure? Blood pressure and heart disease High blood pressure causes heart disease and increases the risk of stroke. This is more likely to develop in people who have high blood pressure readings or are overweight. Have your blood pressure checked: Every 3-5 years if you are 34-5 years of age. Every year if you are 64 years old or older. Diabetes Have regular diabetes screenings. This checks your fasting blood sugar level. Have the screening done: Once every three years after age 35 if you are at a normal weight and have a low risk for diabetes. More often and at a younger age if you are overweight or have a high risk for diabetes. What should I know about preventing infection? Hepatitis B If you have a higher risk for hepatitis B, you should be screened for this virus. Talk with your health care provider to find out if you are at risk for hepatitis B infection. Hepatitis C Testing is recommended for: Everyone born from [birth year] through 1964. Anyone with known risk factors for hepatitis C. Sexually transmitted infections (STIs) Get screened for STIs, including gonorrhea and chlamydia, if: You are sexually active and are younger than 75 years of age. You are older than 75 years  of age and your health care provider tells you that you are at risk for this type of infection. Your sexual activity has changed since you were last screened, and you are at increased risk for chlamydia or gonorrhea. Ask your health care provider if you are at risk. Ask your health care provider about whether you are at high risk for HIV. Your health care  provider may recommend a prescription medicine to help prevent HIV infection. If you choose to take medicine to prevent HIV, you should first get tested for HIV. You should then be tested every 3 months for as long as you are taking the medicine. Pregnancy If you are about to stop having your period (premenopausal) and you may become pregnant, seek counseling before you get pregnant. Take 400 to 800 micrograms (mcg) of folic acid every day if you become pregnant. Ask for birth control (contraception) if you want to prevent pregnancy. Osteoporosis and menopause Osteoporosis is a disease in which the bones lose minerals and strength with aging. This can result in bone fractures. If you are 17 years old or older, or if you are at risk for osteoporosis and fractures, ask your health care provider if you should: Be screened for bone loss. Take a calcium or vitamin D supplement to lower your risk of fractures. Be given hormone replacement therapy (HRT) to treat symptoms of menopause. Follow these instructions at home: Alcohol use Do not drink alcohol if: Your health care provider tells you not to drink. You are pregnant, may be pregnant, or are planning to become pregnant. If you drink alcohol: Limit how much you have to: 0-1 drink a day. Know how much alcohol is in your drink. In the [place] one drink equals one 12 oz bottle of beer (355 mL), one 5 oz glass of wine (148 mL), or one 1 oz glass of hard liquor (44 mL). Lifestyle Do not use any products that contain nicotine or tobacco. These products include cigarettes, chewing tobacco, and vaping devices, such as e-cigarettes. If you need help quitting, ask your health care provider. Do not use street drugs. Do not share needles. Ask your health care provider for help if you need support or information about quitting drugs. General instructions Schedule regular health, dental, and eye exams. Stay current with your vaccines. Tell your health care  provider if: You often feel depressed. You have ever been abused or do not feel safe at home. Summary Adopting a healthy lifestyle and getting preventive care are important in promoting health and wellness. Follow your health care provider's instructions about healthy diet, exercising, and getting tested or screened for diseases. Follow your health care provider's instructions on monitoring your cholesterol and blood pressure. This information is not intended to replace advice given to you by your health care provider. Make sure you discuss any questions you have with your health care provider. Document Revised: 01/30/2021 Document Reviewed: 01/30/2021 Elsevier Patient Education  [place].

## 2021-10-10 ENCOUNTER — Ambulatory Visit (INDEPENDENT_AMBULATORY_CARE_PROVIDER_SITE_OTHER): Payer: PPO | Admitting: Internal Medicine

## 2021-10-10 ENCOUNTER — Other Ambulatory Visit: Payer: Self-pay

## 2021-10-10 VITALS — BP 118/80 | HR 69 | Temp 98.2°F | Ht 61.0 in | Wt 134.4 lb

## 2021-10-10 DIAGNOSIS — E7849 Other hyperlipidemia: Secondary | ICD-10-CM

## 2021-10-10 DIAGNOSIS — R112 Nausea with vomiting, unspecified: Secondary | ICD-10-CM | POA: Diagnosis not present

## 2021-10-10 DIAGNOSIS — I712 Thoracic aortic aneurysm, without rupture, unspecified: Secondary | ICD-10-CM | POA: Insufficient documentation

## 2021-10-10 DIAGNOSIS — I10 Essential (primary) hypertension: Secondary | ICD-10-CM

## 2021-10-10 DIAGNOSIS — Z0000 Encounter for general adult medical examination without abnormal findings: Secondary | ICD-10-CM | POA: Diagnosis not present

## 2021-10-10 DIAGNOSIS — R300 Dysuria: Secondary | ICD-10-CM | POA: Insufficient documentation

## 2021-10-10 DIAGNOSIS — I872 Venous insufficiency (chronic) (peripheral): Secondary | ICD-10-CM

## 2021-10-10 DIAGNOSIS — M810 Age-related osteoporosis without current pathological fracture: Secondary | ICD-10-CM

## 2021-10-10 DIAGNOSIS — R739 Hyperglycemia, unspecified: Secondary | ICD-10-CM

## 2021-10-10 LAB — CBC WITH DIFFERENTIAL/PLATELET
Basophils Absolute: 0 10*3/uL (ref 0.0–0.1)
Basophils Relative: 0.6 % (ref 0.0–3.0)
Eosinophils Absolute: 0.1 10*3/uL (ref 0.0–0.7)
Eosinophils Relative: 0.8 % (ref 0.0–5.0)
HCT: 41.6 % (ref 36.0–46.0)
Hemoglobin: 13.7 g/dL (ref 12.0–15.0)
Lymphocytes Relative: 17.2 % (ref 12.0–46.0)
Lymphs Abs: 1.3 10*3/uL (ref 0.7–4.0)
MCHC: 32.9 g/dL (ref 30.0–36.0)
MCV: 92 fl (ref 78.0–100.0)
Monocytes Absolute: 0.4 10*3/uL (ref 0.1–1.0)
Monocytes Relative: 5.6 % (ref 3.0–12.0)
Neutro Abs: 5.5 10*3/uL (ref 1.4–7.7)
Neutrophils Relative %: 75.8 % (ref 43.0–77.0)
Platelets: 240 10*3/uL (ref 150.0–400.0)
RBC: 4.52 Mil/uL (ref 3.87–5.11)
RDW: 13.5 % (ref 11.5–15.5)
WBC: 7.3 10*3/uL (ref 4.0–10.5)

## 2021-10-10 LAB — POC URINALSYSI DIPSTICK (AUTOMATED)
Bilirubin, UA: NEGATIVE
Blood, UA: NEGATIVE
Glucose, UA: NEGATIVE
Ketones, UA: NEGATIVE
Nitrite, UA: NEGATIVE
Protein, UA: NEGATIVE
Spec Grav, UA: 1.015 (ref 1.010–1.025)
Urobilinogen, UA: 0.2 E.U./dL
pH, UA: 6 (ref 5.0–8.0)

## 2021-10-10 LAB — LIPID PANEL
Cholesterol: 259 mg/dL — ABNORMAL HIGH (ref 0–200)
HDL: 76.6 mg/dL (ref >39.00)
LDL Cholesterol: 164 mg/dL — ABNORMAL HIGH (ref 0–99)
NonHDL: 181.95
Total CHOL/HDL Ratio: 3
Triglycerides: 91 mg/dL (ref 0.0–149.0)
VLDL: 18.2 mg/dL (ref 0.0–40.0)

## 2021-10-10 LAB — COMPREHENSIVE METABOLIC PANEL
ALT: 21 U/L (ref 0–35)
AST: 22 U/L (ref 0–37)
Albumin: 4.5 g/dL (ref 3.5–5.2)
Alkaline Phosphatase: 53 U/L (ref 39–117)
BUN: 19 mg/dL (ref 6–23)
CO2: 27 mEq/L (ref 19–32)
Calcium: 10 mg/dL (ref 8.4–10.5)
Chloride: 103 mEq/L (ref 96–112)
Creatinine, Ser: 0.82 mg/dL (ref 0.40–1.20)
GFR: 70.16 mL/min (ref >60.00)
Glucose, Bld: 92 mg/dL (ref 70–99)
Potassium: 4.3 mEq/L (ref 3.5–5.1)
Sodium: 139 mEq/L (ref 135–145)
Total Bilirubin: 0.6 mg/dL (ref 0.2–1.2)
Total Protein: 7.3 g/dL (ref 6.0–8.3)

## 2021-10-10 LAB — HEMOGLOBIN A1C: Hgb A1c MFr Bld: 5.7 % (ref 4.6–6.5)

## 2021-10-10 MED ORDER — AMLODIPINE BESYLATE 2.5 MG PO TABS: 2.5000 mg | ORAL_TABLET | Freq: Every day | ORAL | 3 refills | Status: DC

## 2021-10-10 MED ORDER — LOSARTAN POTASSIUM 50 MG PO TABS: 50.0000 mg | ORAL_TABLET | Freq: Every day | ORAL | 2 refills | Status: DC

## 2021-10-10 MED ORDER — ONDANSETRON HCL 4 MG PO TABS: 4.0000 mg | ORAL_TABLET | Freq: Three times a day (TID) | ORAL | 0 refills | Status: DC | PRN

## 2021-10-10 NOTE — Assessment & Plan Note (Signed)
Chronic Continue Vasculera 1 tablet daily-she does feel that this is helping

## 2021-10-10 NOTE — Assessment & Plan Note (Signed)
Acute Experiencing dysuria slightly and intermittent odor to urine ?  Related to dehydration or pomegranate juice that she is drinking Need to rule out infection Urinalysis here with trace leukocytes Symptoms mild so will wait for culture results before treating-we will only treat if positive

## 2021-10-10 NOTE — Assessment & Plan Note (Addendum)
Chronic DEXA due-deferred-she is not interested in any treatment and therefore does not want a bone density scan Stressed regular exercise Continue calcium and vitamin D daily

## 2021-10-10 NOTE — Assessment & Plan Note (Signed)
Chronic Blood pressure well controlled CMP Continue amlodipine 2.5 mg daily, losartan 50 mg daily

## 2021-10-10 NOTE — Assessment & Plan Note (Signed)
Acute Started over a month ago and is intermittent-thinks it may occur every 4-5 days No obvious pattern-not related to specific foods/eating, time of day Denies any GERD, nausea or abdominal pain.  Has chronic alternating diarrhea and constipation which is not new from her-related to her IBS Her daughter had something similar that was her gallbladder No new medications or supplements Will check abdominal ultrasound Zofran 4 mg every 8 hours as needed Check routine blood work If symptoms persist will need to see GI

## 2021-10-10 NOTE — Assessment & Plan Note (Signed)
Chronic Regular exercise and healthy diet encouraged Check lipid panel  Continue lifestyle management, fish oil

## 2021-10-10 NOTE — Assessment & Plan Note (Signed)
Chronic Check a1c Low sugar / carb diet Stressed regular exercise  

## 2021-10-11 LAB — URINE CULTURE

## 2021-10-19 ENCOUNTER — Encounter: Payer: Self-pay | Admitting: Internal Medicine

## 2021-10-23 ENCOUNTER — Ambulatory Visit
Admission: RE | Admit: 2021-10-23 | Discharge: 2021-10-23 | Disposition: A | Discharge status: Home / Self Care | Payer: PPO | Source: Ambulatory Visit | Attending: Internal Medicine | Admitting: Internal Medicine

## 2021-10-23 DIAGNOSIS — R112 Nausea with vomiting, unspecified: Secondary | ICD-10-CM

## 2021-10-23 DIAGNOSIS — N281 Cyst of kidney, acquired: Secondary | ICD-10-CM | POA: Diagnosis not present

## 2021-10-26 ENCOUNTER — Encounter: Payer: Self-pay | Admitting: Internal Medicine

## 2021-10-26 NOTE — Progress Notes (Signed)
Outside notes received. Information abstracted. Notes sent to scan.  

## 2021-10-29 ENCOUNTER — Encounter: Payer: Self-pay | Admitting: Internal Medicine

## 2021-10-30 ENCOUNTER — Encounter: Payer: Self-pay | Admitting: Cardiology

## 2021-10-30 DIAGNOSIS — I77810 Thoracic aortic ectasia: Secondary | ICD-10-CM | POA: Insufficient documentation

## 2021-10-30 NOTE — Progress Notes (Signed)
Cardiology Office Note   Date:  10/31/2021   ID:  WINNELL BENTO, DOB 02-25-1946, MRN [id number]  PCP:  Binnie Rail, MD  Cardiologist:   Minus Breeding, MD Referring:  Binnie Rail, MD  Chief Complaint  Patient presents with   Aortic Enlargement      History of Present Illness: Heather Ramsey is a 76 y.o. female who presents for Binnie Rail, MD  I saw her in 2013.  She had poor anterior R wave progression but she had had a negative perfusion study in 2010 so no further evaluation was undertaken.   There was a mention of MVP on previous notes but this was not evident on echo in Jan 2022.    Since I last saw her she has done okay.  Her husband is being evaluated for possible lymphoma and she is under stress.  However, she does continue to be active walking over 2 miles 6 days a week.  She gets a little short of breath walking uphill but this is not changed from previous. The patient denies any new symptoms such as chest discomfort, neck or arm discomfort. There has been no new shortness of breath, PND or orthopnea. There have been no reported palpitations, presyncope or syncope.    Past Medical History:  Diagnosis Date   Atrophic vaginitis    Diverticulosis of colon 2005& 2010   FH colon cancer   Fibromyalgia    Dr Estanislado Pandy   GERD (gastroesophageal reflux disease)    Hemorrhoid    Hyperlipidemia    Hypertension    IBS (irritable bowel syndrome)    Migraine    MVP (mitral valve prolapse)    Osteopenia    Pulmonary nodule 07/2006    Past Surgical History:  Procedure Laterality Date   back surgy  02/26/2017   lumbar 4-5   BREAST BIOPSY Left 2003   [place]   with bladder tack    CATARACT EXTRACTION, BILATERAL  05/2019   COLONOSCOPY  07/2014   negative; Dr Olevia Perches   FOOT SURGERY Left 2014     Current Outpatient Medications  Medication Sig Dispense Refill   amLODipine (NORVASC) 2.5 MG tablet Take 1 tablet (2.5 mg total) by mouth  daily. 90 tablet 3   ASHWAGANDHA PO Take 500 mg by mouth.     b complex vitamins tablet Take 1 tablet by mouth daily.     Calcium Carbonate-Vitamin D (CALCIUM + D PO) Take 1 tablet by mouth 2 (two) times daily.      Dietary Management Product (VASCULERA) TABS Take 1 tablet by mouth daily. 90 tablet 3   Docosahexaenoic Acid (DHA ALGAL-900 PO) Take 900 mg by mouth daily.     fish oil-omega-3 fatty acids 1000 MG capsule Take 1 g by mouth daily.      Ginger, Zingiber officinalis, (GINGER ROOT) 550 MG CAPS Take 1 capsule by mouth daily.     loratadine (CLARITIN) 10 MG tablet Take 10 mg by mouth daily.     losartan (COZAAR) 50 MG tablet Take 1 tablet (50 mg total) by mouth daily. 90 tablet 2   magnesium 30 MG tablet Take 30 mg by mouth 2 (two) times daily.     Misc Natural Products (TART CHERRY ADVANCED PO) Take by mouth daily.     ondansetron (ZOFRAN) 4 MG tablet Take 1 tablet (4 mg total) by mouth every 8 (eight) hours as needed for nausea or vomiting. 20 tablet 0  Probiotic Product (PROBIOTIC-10 PO) Take by mouth daily.     Psyllium (METAMUCIL) 28.3 % POWD Take 1 scoop dissolved in at least 8 ounces water/juice and drink twice daily (Patient taking differently: daily. Take 1 scoop dissolved in at least 8 ounces water/juice and drink twice daily) 1 Bottle 0   TURMERIC PO Take 400 mg by mouth daily.     No current facility-administered medications for this visit.    Allergies:   Clarithromycin, Clindamycin, Levofloxacin, Tramadol, Actonel [risedronate sodium], Atorvastatin, Bactroban [mupirocin], Bactroban [mupirocin calcium], and Risedronate sodium    ROS:  Please see the history of present illness.   Otherwise, review of systems are positive for none.   All other systems are reviewed and negative.    PHYSICAL EXAM: VS:  BP 140/86    Pulse 62    Ht 5\' 1"  (1.549 m)    Wt 136 lb 3.2 oz (61.8 kg)    SpO2 99%    BMI 25.73 kg/m  , BMI Body mass index is 25.73 kg/m. GENERAL:  Well  appearing NECK:  No jugular venous distention, waveform within normal limits, carotid upstroke brisk and symmetric, no bruits, no thyromegaly LUNGS:  Clear to auscultation bilaterally CHEST:  Unremarkable HEART:  PMI not displaced or sustained,S1 and S2 within normal limits, no S3, no S4, no clicks, no rubs, no murmurs ABD:  Flat, positive bowel sounds normal in frequency in pitch, no bruits, no rebound, no guarding, no midline pulsatile mass, no hepatomegaly, no splenomegaly EXT:  2 plus pulses throughout, no edema, no cyanosis no clubbing   EKG:  EKG is  ordered today. The ekg ordered today demonstrates sinus rhythm, rate 62, axis within normal limits, intervals within normal limits, poor anterior R wave progression, no acute ST-T wave changes.   Recent Labs: 10/10/2021: ALT 21; BUN 19; Creatinine, Ser 0.82; Hemoglobin 13.7; Platelets 240.0; Potassium 4.3; Sodium 139    Lipid Panel    Component Value Date/Time   CHOL 259 (H) 10/10/2021 0942   CHOL 232 (H) 10/27/2014 1042   TRIG 91.0 10/10/2021 0942   TRIG 56 10/27/2014 1042   HDL 76.60 10/10/2021 0942   HDL 85 10/27/2014 1042   CHOLHDL 3 10/10/2021 0942   VLDL 18.2 10/10/2021 0942   LDLCALC 164 (H) 10/10/2021 0942   LDLCALC 136 (H) 10/27/2014 1042   LDLDIRECT 186.4 10/21/2013 0926      Wt Readings from Last 3 Encounters:  10/31/21 136 lb 3.2 oz (61.8 kg)  10/10/21 134 lb 6.4 oz (61 kg)  02/06/21 137 lb 3.2 oz (62.2 kg)      Other studies Reviewed: Additional studies/ records that were reviewed today include: Labs. Review of the above records demonstrates:  Please see elsewhere in the note.     ASSESSMENT AND PLAN:  ABNORMAL EKG:   There was mild coronary calcium on scoring last year.  She had a negative perfusion study.   No further testing is indicated.  She will continue with risk reduction.  DYSLIPIDEMIA:   LDL was up to 164.  We again talked about statin and she does not want one.  We talked about a plant-based  diet and suggestions to move further towards this and she will start thinking about doing this.  S  AORTIC ROOT DILATATION:    She is overdue for follow up CT. I will arrange CT.   Current medicines are reviewed at length with the patient today.  The patient does not have concerns regarding medicines.  The following changes have been made: None  Labs/ tests ordered today include: None  Orders Placed This Encounter  Procedures   CT ANGIO CHEST AORTA W/CM & OR WO/CM   Basic metabolic panel   EKG [id number]     Disposition:   FU with me 1 year   Signed, Minus Breeding, MD  10/31/2021 2:22 PM    [facility]

## 2021-10-31 ENCOUNTER — Encounter: Payer: Self-pay | Admitting: Cardiology

## 2021-10-31 ENCOUNTER — Ambulatory Visit: Payer: PPO | Admitting: Cardiology

## 2021-10-31 ENCOUNTER — Other Ambulatory Visit: Payer: Self-pay

## 2021-10-31 VITALS — BP 140/86 | HR 62 | Ht 61.0 in | Wt 136.2 lb

## 2021-10-31 DIAGNOSIS — R9431 Abnormal electrocardiogram [ECG] [EKG]: Secondary | ICD-10-CM | POA: Diagnosis not present

## 2021-10-31 DIAGNOSIS — E785 Hyperlipidemia, unspecified: Secondary | ICD-10-CM | POA: Diagnosis not present

## 2021-10-31 DIAGNOSIS — I77810 Thoracic aortic ectasia: Secondary | ICD-10-CM

## 2021-10-31 NOTE — Patient Instructions (Addendum)
Medication Instructions:   No changes  *If you need a refill on your cardiac medications before your next appointment, please call your pharmacy*   Lab Work:  BMP only if Ct of Aorta is schedule after feb 17 , 2023   If you have labs (blood work) drawn today and your tests are completely normal, you will receive your results only by: [place] (if you have MyChart) OR A paper copy in the mail If you have any lab test that is abnormal or we need to change your treatment, we will call you to review the results.   Testing/Procedures: Will be schedule at [address] Non-Cardiac CT Angiography (CTA) of aorta , is a special type of CT scan that uses a computer to produce multi-dimensional views of major blood vessels throughout the body. In CT angiography, a contrast material is injected through an IV to help visualize the blood vessels   Follow-Up: At [facility], you and your health needs are our priority.  As part of our continuing mission to provide you with exceptional heart care, we have created designated Provider Care Teams.  These Care Teams include your primary Cardiologist (physician) and Advanced Practice Providers (APPs -  Physician Assistants and Nurse Practitioners) who all work together to provide you with the care you need, when you need it.     Your next appointment:   12 month(s)  The format for your next appointment:   In Person  Provider:   Minus Breeding, MD    Other Instructions

## 2021-11-01 LAB — BASIC METABOLIC PANEL
BUN/Creatinine Ratio: 16 (ref 12–28)
BUN: 14 mg/dL (ref 8–27)
CO2: 24 mmol/L (ref 20–29)
Calcium: 9.7 mg/dL (ref 8.7–10.3)
Chloride: 100 mmol/L (ref 96–106)
Creatinine, Ser: 0.86 mg/dL (ref 0.57–1.00)
Glucose: 99 mg/dL (ref 70–99)
Potassium: 4.3 mmol/L (ref 3.5–5.2)
Sodium: 140 mmol/L (ref 134–144)
eGFR: 70 mL/min/{1.73_m2} (ref >59)

## 2021-11-06 ENCOUNTER — Encounter: Payer: Self-pay | Admitting: *Deleted

## 2021-11-08 ENCOUNTER — Ambulatory Visit (INDEPENDENT_AMBULATORY_CARE_PROVIDER_SITE_OTHER)
Admission: RE | Admit: 2021-11-08 | Discharge: 2021-11-08 | Disposition: A | Discharge status: Home / Self Care | Payer: PPO | Source: Ambulatory Visit | Attending: Cardiology | Admitting: Cardiology

## 2021-11-08 ENCOUNTER — Other Ambulatory Visit: Payer: Self-pay

## 2021-11-08 DIAGNOSIS — I77810 Thoracic aortic ectasia: Secondary | ICD-10-CM

## 2021-11-08 DIAGNOSIS — I712 Thoracic aortic aneurysm, without rupture, unspecified: Secondary | ICD-10-CM | POA: Diagnosis not present

## 2021-11-08 MED ORDER — IOHEXOL 350 MG/ML SOLN
100.0000 mL | Freq: Once | INTRAVENOUS | Status: AC | PRN
  Administered 2021-11-08: 100 mL via INTRAVENOUS

## 2021-11-16 ENCOUNTER — Encounter: Payer: Self-pay | Admitting: *Deleted

## 2021-12-08 ENCOUNTER — Ambulatory Visit: Payer: PPO | Admitting: Gastroenterology

## 2021-12-08 ENCOUNTER — Encounter: Payer: Self-pay | Admitting: Gastroenterology

## 2021-12-08 VITALS — BP 118/70 | HR 63 | Ht 61.0 in | Wt 137.5 lb

## 2021-12-08 DIAGNOSIS — R112 Nausea with vomiting, unspecified: Secondary | ICD-10-CM | POA: Diagnosis not present

## 2021-12-08 NOTE — Progress Notes (Signed)
? ?Referring Provider: Binnie Rail, MD ?Primary Care Physician:  Heather Rail, MD ? ? ?Reason for Consultation:  Nausea and vomiting ? ? ?IMPRESSION:  ?Nausea and vomiting since January, not explained by ultrasound ? ?Differential is broad.  Thankfully, no alarm features.  Must consider medication side effects. Heather Ramsey might cause stomach upset, diarrhea, and vomiting and turmeric can cause multiple GI symptoms.  Differential also includes H. Pylori gastritis, esophagitis, gastroparesis, GOO, functional dyspepsia, H pylori gastritis, NSAIDs. ? ?Discussed treatment options such as Phenergan or PPI and she would like to stick with Zofran for now.  ? ?I did not realize a lipase had been checked until she had already left the clinic.  If upper endoscopy is nondiagnostic we will proceed with lipase testing. ? ? ?PLAN: ?- Avoid NSAIDs ?- Discussed Heather Ramsey and Tumeric as a possible irritant ?- EGD ?- See patient instructions for additional recommendations ? ? ?HPI: Heather Ramsey is a 76 y.o. female who presents for evaluation of nausea and vomiting.  The history is obtained through the patient and review of her electronic health record.  This is my first office visit with Heather Ramsey.  I did meet her at the time of her colonoscopy in 2021.  She is a former patient of Dr. Nichola Ramsey with a last visit in 2013 for symptomatic hemorrhoids in the setting of chronic constipation.  She has a history of aortic aneurysm. She is a retired Fut[employer] trader.  ? ?Presents for evaluation of nausea and vomiting. Symptoms developed acutely one day in January when she threw up 16 times.  Occurs every 4 to 7 days.  Not related to eating, defecation, or movement.  No associated abdominal pain, heart burn, dysphagia, odynophagia.  She has longstanding alternating diarrhea and constipation but there is now some more diarrhea. Appetite is good. Weight is stable.  No associated headaches. ? ?She was unable to identified to  food triggers despite keeping a food diary. ? ?She takes multiple herbal therapies including turmeric. The only new is Heather Ramsey that she started a few months prior to symptom onset.  She has wondered if her symptoms are related to the supplements that she takes for fibromyalgia. ? ?No NSAIDs. Uses a fiber supplement most days.  ? ?Zofran has not been particularly helpful. She remembers Phenergan working better in the past that she has used for car sickness.  ? ?Her husband has been diagnosed with lymphoma and this has been very stressful.  ? ?Abdominal ultrasound 10/23/2021: No cholelithiasis, no acute abnormalities, 2.4 cm left renal cyst ? ?Colonoscopy with Dr. Maurene Capes October 2010 showed moderately severe diverticulosis and hemorrhoids. ? ?She had a colonoscopy 02/18/2020 showing left-sided diverticulosis, a 2 mm sigmoid polyp, and a 2 mm ascending colon polyp.  Both polyps were tubular adenomas. ? ?There is no known family history of colon cancer or polyps. No family history of stomach cancer or other GI malignancy. No family history of inflammatory bowel disease or celiac.  ? ? ?Past Medical History:  ?Diagnosis Date  ? Atrophic vaginitis   ? Diverticulosis of colon 2005& 2010  ? FH colon cancer  ? Fibromyalgia   ? Dr Estanislado Pandy  ? GERD (gastroesophageal reflux disease)   ? Hemorrhoid   ? Hyperlipidemia   ? Hypertension   ? IBS (irritable bowel syndrome)   ? Migraine   ? MVP (mitral valve prolapse)   ? Osteopenia   ? Pulmonary nodule 07/2006  ? ? ?Past Surgical History:  ?Procedure Laterality Date  ?  back surgy  02/26/2017  ? lumbar 4-5  ? BREAST BIOPSY Left 2003  ? [place]  ? with bladder tack   ? CATARACT EXTRACTION, BILATERAL  05/2019  ? COLONOSCOPY  07/2014  ? negative; Dr Olevia Perches  ? FOOT SURGERY Left 2014  ? ?Current Outpatient Medications  ?Medication Sig Dispense Refill  ? amLODipine (NORVASC) 2.5 MG tablet Take 1 tablet (2.5 mg total) by mouth daily. 90 tablet 3  ? Heather Ramsey PO Take 500 mg  by mouth.    ? b complex vitamins tablet Take 1 tablet by mouth daily.    ? Calcium Carbonate-Vitamin D (CALCIUM + D PO) Take 1 tablet by mouth 2 (two) times daily.     ? Dietary Management Product (VASCULERA) TABS Take 1 tablet by mouth daily. 90 tablet 3  ? fish oil-omega-3 fatty acids 1000 MG capsule Take 1 g by mouth daily.     ? Ginger, Zingiber officinalis, (GINGER ROOT) 550 MG CAPS Take 1 capsule by mouth daily.    ? loratadine (CLARITIN) 10 MG tablet Take 10 mg by mouth daily.    ? losartan (COZAAR) 50 MG tablet Take 1 tablet (50 mg total) by mouth daily. 90 tablet 2  ? magnesium 30 MG tablet Take 30 mg by mouth 2 (two) times daily.    ? Misc Natural Products (TART CHERRY ADVANCED PO) Take by mouth daily.    ? ondansetron (ZOFRAN) 4 MG tablet Take 1 tablet (4 mg total) by mouth every 8 (eight) hours as needed for nausea or vomiting. 20 tablet 0  ? Probiotic Product (PROBIOTIC-10 PO) Take by mouth daily.    ? Psyllium (METAMUCIL) 28.3 % POWD Take 1 scoop dissolved in at least 8 ounces water/juice and drink twice daily (Patient taking differently: daily. Take 1 scoop dissolved in at least 8 ounces water/juice and drink twice daily) 1 Bottle 0  ? TURMERIC PO Take 400 mg by mouth daily.    ? ?No current facility-administered medications for this visit.  ? ? ?Allergies as of 12/08/2021 - Review Complete 12/08/2021  ?Allergen Reaction Noted  ? Clarithromycin Swelling and Rash 07/22/2014  ? Clindamycin Swelling   ? Levofloxacin Other (See Comments)   ? Tramadol Nausea And Vomiting and Swelling 08/13/2016  ? Actonel [risedronate sodium] Nausea And Vomiting and Other (See Comments) 11/26/2016  ? Atorvastatin Other (See Comments)   ? Bactroban [mupirocin]  10/08/2021  ? Bactroban [mupirocin calcium] Rash 06/13/2015  ? Risedronate sodium Other (See Comments)   ? ? ?Family History  ?Problem Relation Age of Onset  ? Heart attack Father 65  ?     Died with multiple medical problems  ? Colon cancer Father 42  ? Diabetes  Father   ? Tuberculosis Paternal Grandmother   ? Stroke Mother 29  ? Hypothyroidism Mother   ? Hypertension Mother   ? Hypothyroidism Sister   ? Hypertension Sister   ? Diabetes Sister   ? Breast cancer Sister   ? Diabetes Sister   ? Diabetes Sister   ? Heart attack Sister   ?     2 sisters > 65  ? Diabetes Sister   ? Diabetes Sister   ? Diabetes Sister   ? Colon polyps Neg Hx   ? Esophageal cancer Neg Hx   ? Rectal cancer Neg Hx   ? Stomach cancer Neg Hx   ? ? ? ? ?Physical Exam: ?General:   Alert,  well-nourished, pleasant and cooperative in NAD ?Head:  Normocephalic and atraumatic. ?Eyes:  Sclera clear, no icterus.   Conjunctiva pink. ?Ears:  Normal auditory acuity. ?Nose:  No deformity, discharge,  or lesions. ?Mouth:  No deformity or lesions.   ?Neck:  Supple; no masses or thyromegaly. ?Lungs:  Clear throughout to auscultation.   No wheezes. ?Heart:  Regular rate and rhythm; no murmurs. ?Abdomen:  Soft, nontender, nondistended, normal bowel sounds, no rebound or guarding. No hepatosplenomegaly.   ?Rectal:  Deferred  ?Msk:  Symmetrical. No boney deformities ?LAD: No inguinal or umbilical LAD ?Extremities:  No clubbing or edema. ?Neurologic:  Alert and  oriented x4;  grossly nonfocal ?Skin:  Intact without significant lesions or rashes. ?Psych:  Alert and cooperative. Normal mood and affect. ? ?I spent over 30 minutes, including in depth chart review, independent review of results, communicating results with the patient directly, face-to-face time with the patient, coordinating care, and ordering studies and medications as appropriate, and documentation.  ? ?Debralee Braaksma L. Tarri Glenn, MD, MPH ?12/08/2021, 9:43 AM ? ? ? ?  ?

## 2021-12-08 NOTE — Patient Instructions (Addendum)
It was my pleasure to provide care to you today. Based on our discussion, I am providing you with my recommendations below: ? ?RECOMMENDATION(S):  ? ?Herbal therapies such as ashwagandha and tumeric may cause stomach upset and loose stools. I would avoid these medications. ? ?I recommend an upper endoscopy for further evaluation. ? ?Avoid any foods that make you feel nauseated. This may include spicy, strong-smelling, and high fat foods. You might find cold, bland foods easier to eat without feeling nauseated. ? ?Try using ginger products such as tea, ginger tablets, or ginger ale to help with the nausea. ? ?Eat frequent, small, high calorie meals and snacks. Hunger can make the feelings of nausea stronger. ? ?Sit upright or recline with head elevated for at least 30-60 minutes after meals.  ? ?Good oral hygiene with frequent tooth brushing can help reduce unpleasant mouth tastes contributing to nausea. ? ?Apply a cool damp cloth on your neck or forehead if you are very nauseous.  ? ?Relaxation, imagery, acupressure, and acupuncture may all provide some relief. Keep an open mind and try them.  ? ?We discussed treatment options. Please let me know if your symptoms are worsening and you would like to try a new medicine in the meantime.   ? ?ENDOSCOPY:  ? ?You have been scheduled for an endoscopy. Please follow written instructions given to you at your visit today. ? ?INHALERS:  ? ?If you use inhalers (even only as needed), please bring them with you on the day of your procedure. ? ?FOLLOW UP: ? ?After your procedure, you will receive a call from my office staff regarding my recommendation for follow up. ? ?BMI: ? ?If you are age 75 or older, your body mass index should be between 23-30. Your Body mass index is 25.98 kg/m?Marland Kitchen If this is out of the aforementioned range listed, please consider follow up with your Primary Care Provider. ? ?MY CHART: ? ?The [facility] providers would like to encourage you to use [facility] to  communicate with providers for non-urgent requests or questions.  Due to long hold times on the telephone, sending your provider a message by [facility] may be a faster and more efficient way to get a response.  Please allow 48 business hours for a response.  Please remember that this is for non-urgent requests.  ? ?Thank you for trusting me with your gastrointestinal care!   ? ?Thornton Park, MD, MPH ? ?

## 2021-12-12 ENCOUNTER — Other Ambulatory Visit: Payer: Self-pay

## 2021-12-12 ENCOUNTER — Encounter: Payer: Self-pay | Admitting: Gastroenterology

## 2021-12-12 ENCOUNTER — Other Ambulatory Visit: Payer: Self-pay | Admitting: Gastroenterology

## 2021-12-12 ENCOUNTER — Ambulatory Visit (AMBULATORY_SURGERY_CENTER): Payer: PPO | Admitting: Gastroenterology

## 2021-12-12 VITALS — BP 114/70 | HR 64 | Temp 98.6°F | Resp 11 | Wt 137.0 lb

## 2021-12-12 DIAGNOSIS — K2960 Other gastritis without bleeding: Secondary | ICD-10-CM

## 2021-12-12 DIAGNOSIS — K229 Disease of esophagus, unspecified: Secondary | ICD-10-CM | POA: Diagnosis not present

## 2021-12-12 DIAGNOSIS — R112 Nausea with vomiting, unspecified: Secondary | ICD-10-CM

## 2021-12-12 DIAGNOSIS — K449 Diaphragmatic hernia without obstruction or gangrene: Secondary | ICD-10-CM

## 2021-12-12 DIAGNOSIS — Z800 Family history of malignant neoplasm of digestive organs: Secondary | ICD-10-CM

## 2021-12-12 DIAGNOSIS — K2950 Unspecified chronic gastritis without bleeding: Secondary | ICD-10-CM | POA: Diagnosis not present

## 2021-12-12 DIAGNOSIS — K31A0 Gastric intestinal metaplasia, unspecified: Secondary | ICD-10-CM

## 2021-12-12 DIAGNOSIS — B9681 Helicobacter pylori [H. pylori] as the cause of diseases classified elsewhere: Secondary | ICD-10-CM | POA: Diagnosis not present

## 2021-12-12 DIAGNOSIS — K2970 Gastritis, unspecified, without bleeding: Secondary | ICD-10-CM

## 2021-12-12 MED ORDER — SODIUM CHLORIDE 0.9 % IV SOLN: 500.0000 mL | Freq: Once | INTRAVENOUS | Status: DC

## 2021-12-12 MED ORDER — PANTOPRAZOLE SODIUM 40 MG PO TBEC: 40.0000 mg | DELAYED_RELEASE_TABLET | Freq: Every day | ORAL | 0 refills | Status: DC

## 2021-12-12 NOTE — Patient Instructions (Signed)
Handouts given for hiatal hernia and gastritis. ? ?Resume previous medications.  Pick up new Rx for Pantoprazole. ? ?Minimize use of  NSAIDS (Non-Steroidal anti-inflammatory drugs).  (These include, aspirin, aspirin-containing products(products containing salicylic acid like Pepto Bismol and Alka Seltzer), ibuprofen, advil, motrin, naproxen, aleve, goody powders, etc) Tylenol is ok to take as needed, see label for instructions. ? ?YOU HAD AN ENDOSCOPIC PROCEDURE TODAY AT [facility]:   Refer to the procedure report that was given to you for any specific questions about what was found during the examination.  If the procedure report does not answer your questions, please call your gastroenterologist to clarify.  If you requested that your care partner not be given the details of your procedure findings, then the procedure report has been included in a sealed envelope for you to review at your convenience later. ? ?YOU SHOULD EXPECT: Some feelings of bloating in the abdomen. Passage of more gas than usual.  Walking can help get rid of the air that was put into your GI tract during the procedure and reduce the bloating. If you had a lower endoscopy (such as a colonoscopy or flexible sigmoidoscopy) you may notice spotting of blood in your stool or on the toilet paper. If you underwent a bowel prep for your procedure, you may not have a normal bowel movement for a few days. ? ?Please Note:  You might notice some irritation and congestion in your nose or some drainage.  This is from the oxygen used during your procedure.  There is no need for concern and it should clear up in a day or so. ? ?SYMPTOMS TO REPORT IMMEDIATELY: ? ?Following upper endoscopy (EGD) ? Vomiting of blood or coffee ground material ? New chest pain or pain under the shoulder blades ? Painful or persistently difficult swallowing ? New shortness of breath ? Fever of 100?F or higher ? Black, tarry-looking stools ? ?For urgent or emergent  issues, a gastroenterologist can be reached at any hour by calling [phone]. ?Do not use MyChart messaging for urgent concerns.  ? ?DIET:  We do recommend a small meal at first, but then you may proceed to your regular diet.  Drink plenty of fluids but you should avoid alcoholic beverages for 24 hours. ? ?ACTIVITY:  You should plan to take it easy for the rest of today and you should NOT DRIVE or use heavy machinery until tomorrow (because of the sedation medicines used during the test).   ? ?FOLLOW UP: ?Our staff will call the number listed on your records 48-72 hours following your procedure to check on you and address any questions or concerns that you may have regarding the information given to you following your procedure. If we do not reach you, we will leave a message.  We will attempt to reach you two times.  During this call, we will ask if you have developed any symptoms of COVID 19. If you develop any symptoms (ie: fever, flu-like symptoms, shortness of breath, cough etc.) before then, please call [phone].  If you test positive for Covid 19 in the 2 weeks post procedure, please call and report this information to [place].   ? ?If any biopsies were taken you will be contacted by phone or by letter within the next 1-3 weeks.  Please call us at [phone] if you have not heard about the biopsies in 3 weeks.  ? ? ?SIGNATURES/CONFIDENTIALITY: ?You and/or your care partner have signed paperwork which will  be entered into your electronic medical record.  These signatures attest to the fact that that the information above on your After Visit Summary has been reviewed and is understood.  Full responsibility of the confidentiality of this discharge information lies with you and/or your care-partner.  ?

## 2021-12-12 NOTE — Op Note (Signed)
[place] ?Patient Name: Heather Ramsey ?Procedure Date: 12/12/2021 8:46 AM ?MRN: [id number] ?Endoscopist: Thornton Park MD, MD ?Age: 76 ?Referring MD:  ?Date of Birth: Dec 29, 1945 ?Gender: Female ?Account #: [id number] ?Procedure:                Upper GI endoscopy ?Indications:              Nausea with vomiting ?Medicines:                Monitored Anesthesia Care ?Procedure:                Pre-Anesthesia Assessment: ?                          - Prior to the procedure, a History and Physical  ?                          was performed, and patient medications and  ?                          allergies were reviewed. The patient's tolerance of  ?                          previous anesthesia was also reviewed. The risks  ?                          and benefits of the procedure and the sedation  ?                          options and risks were discussed with the patient.  ?                          All questions were answered, and informed consent  ?                          was obtained. Prior Anticoagulants: The patient has  ?                          taken no previous anticoagulant or antiplatelet  ?                          agents. ASA Grade Assessment: II - A patient with  ?                          mild systemic disease. After reviewing the risks  ?                          and benefits, the patient was deemed in  ?                          satisfactory condition to undergo the procedure. ?                          After obtaining informed consent, the endoscope was  ?  passed under direct vision. Throughout the  ?                          procedure, the patient's blood pressure, pulse, and  ?                          oxygen saturations were monitored continuously. The  ?                          Endoscope was introduced through the mouth, and  ?                          advanced to the third part of duodenum. The upper  ?                          GI endoscopy was accomplished  without difficulty.  ?                          The patient tolerated the procedure well. ?Scope In: ?Scope Out: ?Findings:                 The examined esophagus was normal. Biopsies were  ?                          obtained from the distal esophagus with cold  ?                          forceps for histology. ?                          Patchy mild inflammation characterized by erythema,  ?                          friability and granularity was found in the gastric  ?                          body. Biopsies were taken from the antrum, body,  ?                          and fundus with a cold forceps for histology.  ?                          Estimated blood loss was minimal. ?                          A hiatal hernia was present. ?                          The examined duodenum was normal. Biopsies were  ?                          taken with a cold forceps for histology. Estimated  ?                          blood loss was  minimal. ?Complications:            No immediate complications. Estimated blood loss:  ?                          Minimal. ?Estimated Blood Loss:     Estimated blood loss was minimal. ?Impression:               - Normal esophagus. Biopsied. ?                          - Gastritis. Biopsied. ?                          - Hiatal hernia. ?                          - Normal examined duodenum. Biopsied. ?Recommendation:           - Patient has a contact number available for  ?                          emergencies. The signs and symptoms of potential  ?                          delayed complications were discussed with the  ?                          patient. Return to normal activities tomorrow.  ?                          Written discharge instructions were provided to the  ?                          patient. ?                          - Resume previous diet. ?                          - Continue present medications. ?                          - Avoid tumeric and ashwaganda. ?                           - Start pantoprazole 40 mg QD x 8 weeks. ?                          - Await pathology results. ?                          - No aspirin, ibuprofen, naproxen, or other  ?                          non-steroidal anti-inflammatory drugs. ?Thornton Park MD, MD ?12/12/2021 9:08:37 AM ?This report has been signed electronically. ?

## 2021-12-12 NOTE — Progress Notes (Signed)
Indication for procedure: Nausea and vomiting ? ?Please see the 12/08/2021 office note for complete details.  There is been no change in history or physical exam.  The patient remains an appropriate candidate for monitored anesthesia care in the [place]. ?

## 2021-12-12 NOTE — Progress Notes (Signed)
Called to room to assist during endoscopic procedure.  Patient ID and intended procedure confirmed with present staff. Received instructions for my participation in the procedure from the performing physician.  

## 2021-12-12 NOTE — Progress Notes (Signed)
Pt in recovery with monitors in place, VSS. Report given to receiving RN. Bite guard was placed with pt awake to ensure comfort. No dental or soft tissue damage noted. 

## 2021-12-12 NOTE — Progress Notes (Signed)
VS-CW  Pt's states no medical or surgical changes since previsit or office visit.  

## 2021-12-14 ENCOUNTER — Telehealth: Payer: Self-pay

## 2021-12-14 NOTE — Telephone Encounter (Signed)
?  Follow up Call- ? ? ?  12/12/2021  ?  7:31 AM 02/18/2020  ?  7:33 AM  ?Call back number  ?Post procedure Call Back phone  # [phone]  ?Permission to leave phone message Yes Yes  ?  ? ?Patient questions: ? ?Do you have a fever, pain , or abdominal swelling? Stomach still a little sore ?Pain Score  1 ? ?Have you tolerated food without any problems? Yes.   ? ?Have you been able to return to your normal activities? Yes.   ? ?Do you have any questions about your discharge instructions: ?Diet   No. ?Medications  No. ?Follow up visit  No. ? ?Do you have questions or concerns about your Care? No. ? ?Actions: ?* If pain score is 4 or above: ?No action needed, pain <4. ? ?Have you developed a fever since your procedure? no ? ?2.   Have you had an respiratory symptoms (SOB or cough) since your procedure? no ? ?3.   Have you tested positive for COVID 19 since your procedure no ? ?4.   Have you had any family members/close contacts diagnosed with the COVID 19 since your procedure?  n ? ? ?If yes to any of these questions please route to Joylene John, RN and Joella Prince, RN ? ?

## 2021-12-15 ENCOUNTER — Other Ambulatory Visit: Payer: Self-pay

## 2021-12-15 DIAGNOSIS — B9681 Helicobacter pylori [H. pylori] as the cause of diseases classified elsewhere: Secondary | ICD-10-CM

## 2021-12-15 DIAGNOSIS — K2970 Gastritis, unspecified, without bleeding: Secondary | ICD-10-CM

## 2021-12-15 MED ORDER — BISMUTH SUBSALICYLATE 262 MG PO CHEW: 262.0000 mg | CHEWABLE_TABLET | Freq: Four times a day (QID) | ORAL | 0 refills | Status: AC

## 2021-12-15 MED ORDER — METRONIDAZOLE 500 MG PO TABS: 500.0000 mg | ORAL_TABLET | Freq: Two times a day (BID) | ORAL | 0 refills | Status: DC

## 2021-12-15 MED ORDER — PANTOPRAZOLE SODIUM 40 MG PO TBEC: 40.0000 mg | DELAYED_RELEASE_TABLET | Freq: Two times a day (BID) | ORAL | 2 refills | Status: DC

## 2021-12-15 MED ORDER — TETRACYCLINE HCL 500 MG PO CAPS: 500.0000 mg | ORAL_CAPSULE | Freq: Four times a day (QID) | ORAL | 0 refills | Status: AC

## 2021-12-17 ENCOUNTER — Encounter: Payer: Self-pay | Admitting: Gastroenterology

## 2021-12-18 ENCOUNTER — Other Ambulatory Visit: Payer: Self-pay

## 2021-12-18 DIAGNOSIS — B9681 Helicobacter pylori [H. pylori] as the cause of diseases classified elsewhere: Secondary | ICD-10-CM

## 2021-12-18 MED ORDER — METRONIDAZOLE 500 MG PO TABS: 500.0000 mg | ORAL_TABLET | Freq: Four times a day (QID) | ORAL | 0 refills | Status: AC

## 2021-12-19 ENCOUNTER — Other Ambulatory Visit: Payer: Self-pay

## 2021-12-19 ENCOUNTER — Encounter: Payer: Self-pay | Admitting: Gastroenterology

## 2021-12-19 DIAGNOSIS — B9681 Helicobacter pylori [H. pylori] as the cause of diseases classified elsewhere: Secondary | ICD-10-CM

## 2021-12-26 ENCOUNTER — Telehealth: Payer: Self-pay | Admitting: Gastroenterology

## 2021-12-26 NOTE — Telephone Encounter (Signed)
Unable to reach patient, line rings busy. 

## 2021-12-27 NOTE — Telephone Encounter (Signed)
Pt stated that she has had darker stools the last several days: Pt stated that she started taking Pepto Bismol 2 days ago as well: Pt was notified that this is a common side effect of the Pepto Bismol: Pt stated that she was not having any dark stools prior to taking the Pepto Bismol ?Pt verbalized understanding with all questions answered.  ? ?

## 2022-01-05 ENCOUNTER — Encounter: Payer: Self-pay | Admitting: Gastroenterology

## 2022-01-05 DIAGNOSIS — B370 Candidal stomatitis: Secondary | ICD-10-CM

## 2022-01-08 ENCOUNTER — Telehealth: Payer: Self-pay | Admitting: Gastroenterology

## 2022-01-08 MED ORDER — NYSTATIN 100000 UNIT/ML MT SUSP: 5.0000 mL | Freq: Four times a day (QID) | OROMUCOSAL | 0 refills | Status: AC

## 2022-01-08 NOTE — Telephone Encounter (Signed)
Inbound call from patient reports she may be allergic or having a side effect to Metrondazole. Reports white tongue and rash on arms, as well itchy like hives. Best contact number [phone] or [phone] ?

## 2022-01-08 NOTE — Telephone Encounter (Signed)
Called pt and advised if she has not already done so, stop Metronidazole as Rx'd. Informed Rx for Nystatin was sent to her preferred pharmacy this morning in hopes to treat possibility of oral thrush. May take oral Benadryl to treat rash/itching/hives. However, if she shows no sign of immediate improvement or develops, oral/throat swelling, dyspnea/wheezing, dizziness, flushing of the skin, facial swelling, and/or N/V, must call 911 or proceed to ED immediately for anaphylactic treatment. Verbalized acceptance and understanding. MAR updated to reflect allergic reaction. ?

## 2022-01-23 ENCOUNTER — Encounter: Payer: Self-pay | Admitting: Gastroenterology

## 2022-01-24 ENCOUNTER — Encounter: Payer: Self-pay | Admitting: Gastroenterology

## 2022-02-05 ENCOUNTER — Ambulatory Visit: Payer: PPO | Admitting: Rheumatology

## 2022-02-16 ENCOUNTER — Encounter: Payer: Self-pay | Admitting: Gastroenterology

## 2022-02-16 ENCOUNTER — Ambulatory Visit: Payer: PPO | Admitting: Gastroenterology

## 2022-02-16 VITALS — BP 100/70 | HR 71 | Ht 61.05 in | Wt 133.0 lb

## 2022-02-16 DIAGNOSIS — K2970 Gastritis, unspecified, without bleeding: Secondary | ICD-10-CM

## 2022-02-16 DIAGNOSIS — R112 Nausea with vomiting, unspecified: Secondary | ICD-10-CM | POA: Diagnosis not present

## 2022-02-16 DIAGNOSIS — B9681 Helicobacter pylori [H. pylori] as the cause of diseases classified elsewhere: Secondary | ICD-10-CM | POA: Diagnosis not present

## 2022-02-16 NOTE — Progress Notes (Signed)
Referring Provider: Binnie Rail, MD Primary Care Physician:  Binnie Rail, MD   Chief Complaint:  Nausea and vomiting   IMPRESSION:  H. pylori gastritis: Successfully completed 14 days of quadruple therapy with resolution of nausea and vomiting. 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   Orange stool.  This may be related to foods particularly those with beta-carotene or orange food colorings or even as a side effect to her recent antibiotics. Reassurance provided at this time. I asked her to contact me with any concerns for bleeding.   PLAN: - Avoid NSAIDs - Continue to avoid ashwagandha and Tumeric as a possible irritants - EGD recommended to confirm resolution of H. pylori and intestinal metaplasia - See patient instructions for additional recommendations   HPI: Heather Ramsey is a 76 y.o. female returns in follow-up for evaluation of nausea and vomiting.    The history is obtained through the patient and review of her electronic health record.  This is my first office visit with Heather Ramsey.  I did meet her at the time of her colonoscopy in 2021.  She is a former patient of Dr. Nichola Sizer with a last visit in 2013 for symptomatic hemorrhoids in the setting of chronic constipation.  She has a history of aortic aneurysm. She is a retired [employer] trader.   At the time of consultation 12/12/21 she reported nausea and vomiting. Symptoms developed acutely one day in January when she threw up 16 times.  Occurs every 4 to 7 days.  Not related to eating, defecation, or movement.  No associated abdominal pain, heart burn, dysphagia, odynophagia.  She has longstanding alternating diarrhea and constipation but there is now some more diarrhea. Appetite is good. Weight is stable.  No associated headaches. She was unable to identified to food triggers despite keeping a food diary. She was taking multiple herbal therapies  including turmeric and ashwagandha.  She has wondered if her symptoms are related to the supplements that she takes for fibromyalgia. No NSAIDs. Uses a fiber supplement most days.  Zofran has not been particularly helpful. She remembers Phenergan working better in the past that she has used for car sickness.  Her husband has been diagnosed with lymphoma and this has been very stressful.   Abdominal ultrasound 10/23/2021: No cholelithiasis, no acute abnormalities, 2.4 cm left renal cyst  EGD 12/12/2021 showed H. pylori gastritis and a hiatal hernia.  Intestinal metaplasia was seen in the antral biopsies.  Esophageal and duodenal biopsies were normal.  She completed 14 days of tetracycline, bismuth subsalicylate, Flagyl, and pantoprazole.    She returns today in scheduled follow-up.  The diarrhea and nausea have improved but she is concerned because her stool is orange-colored. She stopped taking all supplements. Her symptoms have otherwise resolved. She continues to have some urgency with defecation. She continues to have significant stress surrounding her husband's cancer diagnosis. He is now having some swallowing trouble.     Endoscopic history: - Colonoscopy with Dr. Maurene Capes October 2010 showed moderately severe diverticulosis and hemorrhoids. - Colonoscopy 02/18/2020 showing left-sided diverticulosis, a 2 mm sigmoid polyp, and a 2 mm ascending colon polyp.  Both polyps were tubular adenomas. - EGD 12/12/2021 showed H. pylori gastritis and a hiatal hernia.  Intestinal metaplasia was seen in the antral biopsies.  Esophageal and duodenal biopsies were normal.    Past Medical History:  Diagnosis Date   Atrophic vaginitis    Diverticulosis of  colon 2005& 2010   FH colon cancer   Fibromyalgia    Dr Estanislado Pandy   GERD (gastroesophageal reflux disease)    Hemorrhoid    Hyperlipidemia    Hypertension    IBS (irritable bowel syndrome)    Migraine    MVP (mitral valve prolapse)    Osteopenia     Pulmonary nodule 07/2006       Physical Exam: General:   Alert,  well-nourished, pleasant and cooperative in NAD Head:  Normocephalic and atraumatic. Eyes:  Sclera clear, no icterus.   Conjunctiva pink.ezes. Heart:  Regular rate and rhythm; no murmurs. Abdomen:  Soft, nontender, nondistended, normal bowel sounds, no rebound or guarding. No hepatosplenomegaly.   Neurologic:  Alert and  oriented x4;  grossly nonfocal Skin:  Intact without significant lesions or rashes. Psych:  Alert and cooperative. Normal mood and affect.  I spent over 30 minutes, including in depth chart review, independent review of results, communicating results with the patient directly, face-to-face time with the patient, coordinating care, and ordering studies and medications as appropriate, and documentation.   Mai Longnecker L. Tarri Glenn, MD, MPH 02/16/2022, 9:02 AM

## 2022-02-16 NOTE — Patient Instructions (Addendum)
It was my pleasure to provide care to you today.  I am delighted that you are starting to feel better.  Based on our discussion, I am providing you with my recommendations below:  RECOMMENDATION(S):   I did not change your medications today.  ENDOSCOPY:   You have been scheduled for an endoscopy to follow-up on your H. pylori gastritis and intestinal metaplasia. Please follow written instructions given to you at your visit today.  INHALERS:   If you use inhalers (even only as needed), please bring them with you on the day of your procedure.  FOLLOW UP:  After your procedure, you will receive a call from my office staff regarding my recommendation for follow up.  BMI:  If you are age 87 or older, your body mass index should be between 23-30. Your Body mass index is 25.09 kg/m. If this is out of the aforementioned range listed, please consider follow up with your Primary Care Provider.   MY CHART:  The  GI providers would like to encourage you to use [facility] to communicate with providers for non-urgent requests or questions.  Due to long hold times on the telephone, sending your provider a message by [facility] may be a faster and more efficient way to get a response.  Please allow 48 business hours for a response.  Please remember that this is for non-urgent requests.   Thank you for trusting me with your gastrointestinal care!    Thornton Park, MD, MPH

## 2022-02-28 ENCOUNTER — Ambulatory Visit (AMBULATORY_SURGERY_CENTER): Payer: PPO | Admitting: Gastroenterology

## 2022-02-28 ENCOUNTER — Encounter: Payer: Self-pay | Admitting: Gastroenterology

## 2022-02-28 VITALS — BP 99/67 | HR 60 | Temp 97.1°F | Resp 15 | Ht 61.0 in | Wt 133.0 lb

## 2022-02-28 DIAGNOSIS — K2950 Unspecified chronic gastritis without bleeding: Secondary | ICD-10-CM | POA: Diagnosis not present

## 2022-02-28 DIAGNOSIS — K2970 Gastritis, unspecified, without bleeding: Secondary | ICD-10-CM | POA: Diagnosis not present

## 2022-02-28 DIAGNOSIS — K209 Esophagitis, unspecified without bleeding: Secondary | ICD-10-CM | POA: Diagnosis not present

## 2022-02-28 DIAGNOSIS — B9681 Helicobacter pylori [H. pylori] as the cause of diseases classified elsewhere: Secondary | ICD-10-CM

## 2022-02-28 DIAGNOSIS — K2100 Gastro-esophageal reflux disease with esophagitis, without bleeding: Secondary | ICD-10-CM

## 2022-02-28 DIAGNOSIS — K259 Gastric ulcer, unspecified as acute or chronic, without hemorrhage or perforation: Secondary | ICD-10-CM

## 2022-02-28 MED ORDER — SODIUM CHLORIDE 0.9 % IV SOLN: 500.0000 mL | Freq: Once | INTRAVENOUS | Status: DC

## 2022-02-28 NOTE — Progress Notes (Signed)
Called to room to assist during endoscopic procedure.  Patient ID and intended procedure confirmed with present staff. Received instructions for my participation in the procedure from the performing physician.  

## 2022-02-28 NOTE — Progress Notes (Signed)
Pt in recovery with monitors in place, VSS. Report given to receiving RN. Bite guard was placed with pt awake to ensure comfort. No dental or soft tissue damage noted. 

## 2022-02-28 NOTE — Patient Instructions (Signed)
Please read handouts provided. Continue present medications. Await pathology results. No aspirin, ibuprofen, naproxen, or other non-steriodal anti-inflammatory drugs.   YOU HAD AN ENDOSCOPIC PROCEDURE TODAY AT [facility]:   Refer to the procedure report that was given to you for any specific questions about what was found during the examination.  If the procedure report does not answer your questions, please call your gastroenterologist to clarify.  If you requested that your care partner not be given the details of your procedure findings, then the procedure report has been included in a sealed envelope for you to review at your convenience later.  YOU SHOULD EXPECT: Some feelings of bloating in the abdomen. Passage of more gas than usual.  Walking can help get rid of the air that was put into your GI tract during the procedure and reduce the bloating. If you had a lower endoscopy (such as a colonoscopy or flexible sigmoidoscopy) you may notice spotting of blood in your stool or on the toilet paper. If you underwent a bowel prep for your procedure, you may not have a normal bowel movement for a few days.  Please Note:  You might notice some irritation and congestion in your nose or some drainage.  This is from the oxygen used during your procedure.  There is no need for concern and it should clear up in a day or so.  SYMPTOMS TO REPORT IMMEDIATELY:   Following upper endoscopy (EGD)  Vomiting of blood or coffee ground material  New chest pain or pain under the shoulder blades  Painful or persistently difficult swallowing  New shortness of breath  Fever of 100F or higher  Black, tarry-looking stools  For urgent or emergent issues, a gastroenterologist can be reached at any hour by calling [phone]. Do not use MyChart messaging for urgent concerns.    DIET:  We do recommend a small meal at first, but then you may proceed to your regular diet.  Drink plenty of fluids  but you should avoid alcoholic beverages for 24 hours.  ACTIVITY:  You should plan to take it easy for the rest of today and you should NOT DRIVE or use heavy machinery until tomorrow (because of the sedation medicines used during the test).    FOLLOW UP: Our staff will call the number listed on your records 24-72 hours following your procedure to check on you and address any questions or concerns that you may have regarding the information given to you following your procedure. If we do not reach you, we will leave a message.  We will attempt to reach you two times.  During this call, we will ask if you have developed any symptoms of COVID 19. If you develop any symptoms (ie: fever, flu-like symptoms, shortness of breath, cough etc.) before then, please call [phone].  If you test positive for Covid 19 in the 2 weeks post procedure, please call and report this information to [place].    If any biopsies were taken you will be contacted by phone or by letter within the next 1-3 weeks.  Please call us at [phone] if you have not heard about the biopsies in 3 weeks.    SIGNATURES/CONFIDENTIALITY: You and/or your care partner have signed paperwork which will be entered into your electronic medical record.  These signatures attest to the fact that that the information above on your After Visit Summary has been reviewed and is understood.  Full responsibility of the confidentiality of this discharge  information lies with you and/or your care-partner.

## 2022-02-28 NOTE — Progress Notes (Signed)
Indication for upper endoscopy: Nausea, vomiting, H. pylori gastritis, intestinal metaplasia on prior gastric biopsies  Please see my 02/16/2022 office visit for complete details.  There is been no significant change in history or physical exam since that time.  The patient remains an appropriate candidate for monitored anesthesia

## 2022-02-28 NOTE — Op Note (Signed)
[facility] Patient Name: Ithzel Fedorchak Procedure Date: 02/28/2022 8:15 AM MRN: [id number] Endoscopist: Thornton Park MD, MD Age: 76 Referring MD:  Date of Birth: 07/12/1946 Gender: Female Account #: [id number] Procedure:                Upper GI endoscopy Indications:              Follow-up of Helicobacter pylori, Follow-up of                            intestinal metaplasia, recent nausea Medicines:                Monitored Anesthesia Care Procedure:                Pre-Anesthesia Assessment:                           - Prior to the procedure, a History and Physical                            was performed, and patient medications and                            allergies were reviewed. The patient's tolerance of                            previous anesthesia was also reviewed. The risks                            and benefits of the procedure and the sedation                            options and risks were discussed with the patient.                            All questions were answered, and informed consent                            was obtained. Prior Anticoagulants: The patient has                            taken no previous anticoagulant or antiplatelet                            agents. ASA Grade Assessment: III - A patient with                            severe systemic disease. After reviewing the risks                            and benefits, the patient was deemed in                            satisfactory condition to undergo the procedure.  After obtaining informed consent, the endoscope was                            passed under direct vision. Throughout the                            procedure, the patient's blood pressure, pulse, and                            oxygen saturations were monitored continuously. The                            Endoscope was introduced through the mouth, and                            advanced to  the third part of duodenum. The upper                            GI endoscopy was accomplished without difficulty.                            The patient tolerated the procedure well. Scope In: Scope Out: Findings:                 LA Grade A (one or more mucosal breaks less than 5                            mm, not extending between tops of 2 mucosal folds)                            esophagitis with no bleeding was found 36 cm from                            the incisors. Biopsies were taken with a cold                            forceps for histology. Estimated blood loss was                            minimal.                           The entire examined stomach was normal except for                            mild mucosal pallor in the antrum. Biopsies were                            taken from the greater curve, angularis, lesser                            curve, and fundus with a cold forceps for  histology. Estimated blood loss was minimal.                           A hiatal hernia was present.                           The examined duodenum was normal. Complications:            No immediate complications. Estimated Blood Loss:     Estimated blood loss was minimal. Impression:               - LA Grade A reflux esophagitis with no bleeding.                            Biopsied.                           - Normal stomach. Biopsied.                           - Hiatal hernia.                           - Normal examined duodenum. Recommendation:           - Patient has a contact number available for                            emergencies. The signs and symptoms of potential                            delayed complications were discussed with the                            patient. Return to normal activities tomorrow.                            Written discharge instructions were provided to the                            patient.                           -  Resume previous diet.                           - Continue present medications.                           - Await pathology results.                           - No aspirin, ibuprofen, naproxen, or other                            non-steroidal anti-inflammatory drugs. Thornton Park MD, MD 02/28/2022 8:33:48 AM This report has been signed electronically.

## 2022-03-01 ENCOUNTER — Telehealth: Payer: Self-pay

## 2022-03-01 NOTE — Telephone Encounter (Signed)
Second attempt follow up call to pt, lm for pt to call if having any problems or questions.

## 2022-03-01 NOTE — Telephone Encounter (Signed)
First attempt follow up call to pt, lm for pt to call if having any problems or questions, otherwise we will call them back later this morning or early this afternoon.  °

## 2022-05-08 DIAGNOSIS — M1220 Villonodular synovitis (pigmented), unspecified site: Secondary | ICD-10-CM | POA: Diagnosis not present

## 2022-05-08 DIAGNOSIS — M12272 Villonodular synovitis (pigmented), left ankle and foot: Secondary | ICD-10-CM | POA: Diagnosis not present

## 2022-05-17 ENCOUNTER — Ambulatory Visit: Payer: PPO | Admitting: Gastroenterology

## 2022-06-08 ENCOUNTER — Ambulatory Visit: Payer: PPO

## 2022-06-13 DIAGNOSIS — H5211 Myopia, right eye: Secondary | ICD-10-CM | POA: Diagnosis not present

## 2022-06-13 DIAGNOSIS — H26493 Other secondary cataract, bilateral: Secondary | ICD-10-CM | POA: Diagnosis not present

## 2022-06-13 DIAGNOSIS — H43813 Vitreous degeneration, bilateral: Secondary | ICD-10-CM | POA: Diagnosis not present

## 2022-06-13 DIAGNOSIS — H04123 Dry eye syndrome of bilateral lacrimal glands: Secondary | ICD-10-CM | POA: Diagnosis not present

## 2022-06-13 DIAGNOSIS — Z961 Presence of intraocular lens: Secondary | ICD-10-CM | POA: Diagnosis not present

## 2022-06-13 DIAGNOSIS — H524 Presbyopia: Secondary | ICD-10-CM | POA: Diagnosis not present

## 2022-06-13 DIAGNOSIS — H52202 Unspecified astigmatism, left eye: Secondary | ICD-10-CM | POA: Diagnosis not present

## 2022-06-27 ENCOUNTER — Telehealth: Payer: Self-pay

## 2022-06-27 ENCOUNTER — Ambulatory Visit (INDEPENDENT_AMBULATORY_CARE_PROVIDER_SITE_OTHER): Payer: PPO

## 2022-06-27 VITALS — BP 114/70 | HR 69 | Ht 61.05 in | Wt 136.0 lb

## 2022-06-27 DIAGNOSIS — Z0000 Encounter for general adult medical examination without abnormal findings: Secondary | ICD-10-CM

## 2022-06-27 NOTE — Patient Instructions (Signed)
[name] , Thank you for taking time to come for your Medicare Wellness Visit. I appreciate your ongoing commitment to your health goals. Please review the following plan we discussed and let me know if I can assist you in the future.   These are the goals we discussed:  Goals      My goal is to lose weight around 5 pounds.        This is a list of the screening recommended for you and due dates:  Health Maintenance  Topic Date Due   Tetanus Vaccine  12/31/2017   DEXA scan (bone density measurement)  08/03/2019   COVID-19 Vaccine (3 - Moderna risk series) 01/01/2020   Flu Shot  04/24/2022   Colon Cancer Screening  02/17/2025   Pneumonia Vaccine  Completed   Hepatitis C Screening: USPSTF Recommendation to screen - Ages 18-79 yo.  Completed   Zoster (Shingles) Vaccine  Completed   HPV Vaccine  Aged Out    Advanced directives: Yes; Documents on file.  Conditions/risks identified: Yes  Next appointment: Follow up in one year for your annual wellness visit.   Preventive Care 34 Years and Older, Female Preventive care refers to lifestyle choices and visits with your health care provider that can promote health and wellness. What does preventive care include? A yearly physical exam. This is also called an annual well check. Dental exams once or twice a year. Routine eye exams. Ask your health care provider how often you should have your eyes checked. Personal lifestyle choices, including: Daily care of your teeth and gums. Regular physical activity. Eating a healthy diet. Avoiding tobacco and drug use. Limiting alcohol use. Practicing safe sex. Taking low-dose aspirin every day. Taking vitamin and mineral supplements as recommended by your health care provider. What happens during an annual well check? The services and screenings done by your health care provider during your annual well check will depend on your age, overall health, lifestyle risk factors, and family  history of disease. Counseling  Your health care provider may ask you questions about your: Alcohol use. Tobacco use. Drug use. Emotional well-being. Home and relationship well-being. Sexual activity. Eating habits. History of falls. Memory and ability to understand (cognition). Work and work Statistician. Reproductive health. Screening  You may have the following tests or measurements: Height, weight, and BMI. Blood pressure. Lipid and cholesterol levels. These may be checked every 5 years, or more frequently if you are over 3 years old. Skin check. Lung cancer screening. You may have this screening every year starting at age 40 if you have a 30-pack-year history of smoking and currently smoke or have quit within the past 15 years. Fecal occult blood test (FOBT) of the stool. You may have this test every year starting at age 72. Flexible sigmoidoscopy or colonoscopy. You may have a sigmoidoscopy every 5 years or a colonoscopy every 10 years starting at age 61. Hepatitis C blood test. Hepatitis B blood test. Sexually transmitted disease (STD) testing. Diabetes screening. This is done by checking your blood sugar (glucose) after you have not eaten for a while (fasting). You may have this done every 1-3 years. Bone density scan. This is done to screen for osteoporosis. You may have this done starting at age 13. Mammogram. This may be done every 1-2 years. Talk to your health care provider about how often you should have regular mammograms. Talk with your health care provider about your test results, treatment options, and if necessary, the need  for more tests. Vaccines  Your health care provider may recommend certain vaccines, such as: Influenza vaccine. This is recommended every year. Tetanus, diphtheria, and acellular pertussis (Tdap, Td) vaccine. You may need a Td booster every 10 years. Zoster vaccine. You may need this after age 12. Pneumococcal 13-valent conjugate (PCV13)  vaccine. One dose is recommended after age 82. Pneumococcal polysaccharide (PPSV23) vaccine. One dose is recommended after age 49. Talk to your health care provider about which screenings and vaccines you need and how often you need them. This information is not intended to replace advice given to you by your health care provider. Make sure you discuss any questions you have with your health care provider. Document Released: 10/07/2015 Document Revised: 05/30/2016 Document Reviewed: 07/12/2015 Elsevier Interactive Patient Education  2017 [place] Prevention in the Home Falls can cause injuries. They can happen to people of all ages. There are many things you can do to make your home safe and to help prevent falls. What can I do on the outside of my home? Regularly fix the edges of walkways and driveways and fix any cracks. Remove anything that might make you trip as you walk through a door, such as a raised step or threshold. Trim any bushes or trees on the path to your home. Use bright outdoor lighting. Clear any walking paths of anything that might make someone trip, such as rocks or tools. Regularly check to see if handrails are loose or broken. Make sure that both sides of any steps have handrails. Any raised decks and porches should have guardrails on the edges. Have any leaves, snow, or ice cleared regularly. Use sand or salt on walking paths during winter. Clean up any spills in your garage right away. This includes oil or grease spills. What can I do in the bathroom? Use night lights. Install grab bars by the toilet and in the tub and shower. Do not use towel bars as grab bars. Use non-skid mats or decals in the tub or shower. If you need to sit down in the shower, use a plastic, non-slip stool. Keep the floor dry. Clean up any water that spills on the floor as soon as it happens. Remove soap buildup in the tub or shower regularly. Attach bath mats securely with  double-sided non-slip rug tape. Do not have throw rugs and other things on the floor that can make you trip. What can I do in the bedroom? Use night lights. Make sure that you have a light by your bed that is easy to reach. Do not use any sheets or blankets that are too big for your bed. They should not hang down onto the floor. Have a firm chair that has side arms. You can use this for support while you get dressed. Do not have throw rugs and other things on the floor that can make you trip. What can I do in the kitchen? Clean up any spills right away. Avoid walking on wet floors. Keep items that you use a lot in easy-to-reach places. If you need to reach something above you, use a strong step stool that has a grab bar. Keep electrical cords out of the way. Do not use floor polish or wax that makes floors slippery. If you must use wax, use non-skid floor wax. Do not have throw rugs and other things on the floor that can make you trip. What can I do with my stairs? Do not leave any items on the stairs.  Make sure that there are handrails on both sides of the stairs and use them. Fix handrails that are broken or loose. Make sure that handrails are as long as the stairways. Check any carpeting to make sure that it is firmly attached to the stairs. Fix any carpet that is loose or worn. Avoid having throw rugs at the top or bottom of the stairs. If you do have throw rugs, attach them to the floor with carpet tape. Make sure that you have a light switch at the top of the stairs and the bottom of the stairs. If you do not have them, ask someone to add them for you. What else can I do to help prevent falls? Wear shoes that: Do not have high heels. Have rubber bottoms. Are comfortable and fit you well. Are closed at the toe. Do not wear sandals. If you use a stepladder: Make sure that it is fully opened. Do not climb a closed stepladder. Make sure that both sides of the stepladder are locked  into place. Ask someone to hold it for you, if possible. Clearly mark and make sure that you can see: Any grab bars or handrails. First and last steps. Where the edge of each step is. Use tools that help you move around (mobility aids) if they are needed. These include: Canes. Walkers. Scooters. Crutches. Turn on the lights when you go into a dark area. Replace any light bulbs as soon as they burn out. Set up your furniture so you have a clear path. Avoid moving your furniture around. If any of your floors are uneven, fix them. If there are any pets around you, be aware of where they are. Review your medicines with your doctor. Some medicines can make you feel dizzy. This can increase your chance of falling. Ask your doctor what other things that you can do to help prevent falls. This information is not intended to replace advice given to you by your health care provider. Make sure you discuss any questions you have with your health care provider. Document Released: 07/07/2009 Document Revised: 02/16/2016 Document Reviewed: 10/15/2014 Elsevier Interactive Patient Education  2017 [employer].

## 2022-06-27 NOTE — Progress Notes (Signed)
Virtual Visit via Telephone Note  I connected with  Heather Ramsey on 06/27/22 at  1:30 PM EDT by telephone and verified that I am speaking with the correct person using two identifiers.  Location: Patient: HOME Provider: [facility] Persons participating in the virtual visit: patient/Nurse Health Advisor   I discussed the limitations, risks, security and privacy concerns of performing an evaluation and management service by telephone and the availability of in person appointments. The patient expressed understanding and agreed to proceed.  Interactive audio and video telecommunications were attempted between this nurse and patient, however failed, due to patient having technical difficulties OR patient did not have access to video capability.  We continued and completed visit with audio only.  Some vital signs may be absent or patient reported.   Sheral Flow, LPN  Subjective:   Heather Ramsey is a 76 y.o. female who presents for Medicare Annual (Subsequent) preventive examination.  Review of Systems     Cardiac Risk Factors include: advanced age (>47mn, >>27women)     Objective:    Today's Vitals   06/27/22 1334  BP: 114/70  Pulse: 69  Weight: 136 lb (61.7 kg)  Height: 5' 1.05" (1.551 m)  PainSc: 0-No pain   Body mass index is 25.65 kg/m.     06/27/2022    1:40 PM 06/02/2021    6:19 PM 06/10/2019   10:14 AM 05/07/2018    9:26 AM 05/02/2017   10:36 AM 02/20/2017    9:42 AM 07/03/2016   10:21 AM  Advanced Directives  Does Patient Have a Medical Advance Directive? Yes Yes Yes Yes Yes Yes Yes  Type of AParamedicof [place]Living will HArrow RockLiving will HCrystal LakeLiving will HLos AlamitosLiving will HRidgevilleLiving will Living will HNim[place]Living will  Does patient want to make changes to medical advance directive? No - Patient  declined No - Patient declined    No - Patient declined No - Patient declined  Copy of HSankertownin Chart? Yes - validated most recent copy scanned in chart (See row information) Yes - validated most recent copy scanned in chart (See row information) No - copy requested No - copy requested No - copy requested  No - copy requested    Current Medications (verified) Outpatient Encounter Medications as of 06/27/2022  Medication Sig   ASHWAGANDHA PO Take 500 mg by mouth.   b complex vitamins tablet Take 1 tablet by mouth daily.   Calcium Carbonate-Vitamin D (CALCIUM + D PO) Take 1 tablet by mouth 2 (two) times daily.    Dietary Management Product (VASCULERA) TABS Take 1 tablet by mouth daily.   fish oil-omega-3 fatty acids 1000 MG capsule Take 1 g by mouth daily.    Ginger, Zingiber officinalis, (GINGER ROOT) 550 MG CAPS Take 1 capsule by mouth daily.   loratadine (CLARITIN) 10 MG tablet Take 10 mg by mouth daily.   losartan (COZAAR) 50 MG tablet Take 1 tablet (50 mg total) by mouth daily.   magnesium 30 MG tablet Take 30 mg by mouth 2 (two) times daily.   Misc Natural Products (TART CHERRY ADVANCED PO) Take by mouth daily.   Probiotic Product (PROBIOTIC-10 PO) Take by mouth daily.   Psyllium (METAMUCIL) 28.3 % POWD Take 1 scoop dissolved in at least 8 ounces water/juice and drink twice daily (Patient taking differently: daily. Take 1 scoop dissolved in at least  8 ounces water/juice and drink twice daily)   amLODipine (NORVASC) 2.5 MG tablet Take 1 tablet (2.5 mg total) by mouth daily.   ondansetron (ZOFRAN) 4 MG tablet Take 1 tablet (4 mg total) by mouth every 8 (eight) hours as needed for nausea or vomiting. (Patient not taking: Reported on 06/27/2022)   pantoprazole (PROTONIX) 40 MG tablet Take 1 tablet (40 mg total) by mouth 2 (two) times daily. Take 30 minutes before breakfast daily for 8 weeks. (Patient not taking: Reported on 06/27/2022)   TURMERIC PO Take 400 mg by mouth  daily. (Patient not taking: Reported on 06/27/2022)   No facility-administered encounter medications on file as of 06/27/2022.    Allergies (verified) Clarithromycin, Clindamycin, Levofloxacin, Metronidazole, Tramadol, Actonel [risedronate sodium], Atorvastatin, Bactroban [mupirocin], Bactroban [mupirocin calcium], and Risedronate sodium   History: Past Medical History:  Diagnosis Date   Atrophic vaginitis    Diverticulosis of colon 2005& 2010   FH colon cancer   Fibromyalgia    Dr Estanislado Pandy   GERD (gastroesophageal reflux disease)    Hemorrhoid    Hyperlipidemia    Hypertension    IBS (irritable bowel syndrome)    Migraine    MVP (mitral valve prolapse)    Osteopenia    Pulmonary nodule 07/2006   Past Surgical History:  Procedure Laterality Date   back surgy  02/26/2017   lumbar 4-5   BREAST BIOPSY Left 2003   [place]   with bladder tack    CATARACT EXTRACTION, BILATERAL  05/2019   COLONOSCOPY  07/2014   negative; Dr Olevia Perches   FOOT SURGERY Left 2014   Family History  Problem Relation Age of Onset   Heart attack Father 8       Died with multiple medical problems   Colon cancer Father 7   Diabetes Father    Tuberculosis Paternal 2    Stroke Mother 14   Hypothyroidism Mother    Hypertension Mother    Hypothyroidism Sister    Hypertension Sister    Diabetes Sister    Breast cancer Sister    Diabetes Sister    Diabetes Sister    Heart attack Sister        2 sisters > 66   Diabetes Sister    Diabetes Sister    Diabetes Sister    Colon polyps Neg Hx    Esophageal cancer Neg Hx    Rectal cancer Neg Hx    Stomach cancer Neg Hx    Social History   Socioeconomic History   Marital status: Married    Spouse name: Not on file   Number of children: 2   Years of education: Not on file   Highest education level: Not on file  Occupational History   Occupation: retired  Tobacco Use   Smoking status: Never   Smokeless tobacco: Never   Vaping Use   Vaping Use: Never used  Substance and Sexual Activity   Alcohol use: No   Drug use: No   Sexual activity: Yes    Birth control/protection: Post-menopausal  Other Topics Concern   Not on file  Social History Narrative   Regular exercise- yes: walks, but not as much due to knee arthritis.  Does stretching   Lives with husband.         Social Determinants of Health   Financial Resource Strain: Low Risk  (06/27/2022)   Overall Financial Resource Strain (CARDIA)    Difficulty of Paying Living Expenses: Not hard at  all  Food Insecurity: No Food Insecurity (06/27/2022)   Hunger Vital Sign    Worried About Running Out of Food in the Last Year: Never true    Ran Out of Food in the Last Year: Never true  Transportation Needs: No Transportation Needs (06/27/2022)   PRAPARE - Hydrologist (Medical): No    Lack of Transportation (Non-Medical): No  Physical Activity: Sufficiently Active (06/27/2022)   Exercise Vital Sign    Days of Exercise per Week: 5 days    Minutes of Exercise per Session: 60 min  Stress: No Stress Concern Present (06/27/2022)   [place]    Feeling of Stress : Not at all  Social Connections: Moderately Integrated (06/27/2022)   Social Connection and Isolation Panel [NHANES]    Frequency of Communication with Friends and Family: More than three times a week    Frequency of Social Gatherings with Friends and Family: Once a week    Attends Religious Services: More than 4 times per year    Active Member of [employer] or Organizations: No    Attends Music therapist: Never    Marital Status: Married    Tobacco Counseling Counseling given: Not Answered   Clinical Intake:  Pre-visit preparation completed: Yes  Pain : No/denies pain Pain Score: 0-No pain     BMI - recorded: 25.65 Nutritional Status: BMI 25 -29 Overweight Nutritional Risks:  None Diabetes: No  How often do you need to have someone help you when you read instructions, pamphlets, or other written materials from your doctor or pharmacy?: 1 - Never What is the last grade level you completed in school?: HSG; 2 years of college  Diabetic? NO  Interpreter Needed?: No  Information entered by :: Lisette Abu, LPN.   Activities of Daily Living    06/27/2022    1:53 PM 10/10/2021    9:53 AM  In your present state of health, do you have any difficulty performing the following activities:  Hearing? 0 0  Vision? 0 0  Difficulty concentrating or making decisions? 0 0  Walking or climbing stairs? 0 0  Dressing or bathing? 0 0  Doing errands, shopping? 0 0  Preparing Food and eating ? N   Using the Toilet? N   In the past six months, have you accidently leaked urine? Y   Do you have problems with loss of bowel control? N   Managing your Medications? N   Managing your Finances? N   Housekeeping or managing your Housekeeping? N     Patient Care Team: Binnie Rail, MD as PCP - General (Internal Medicine) Minus Breeding, MD as PCP - Cardiology (Cardiology) Kristeen Miss, MD as Consulting Physician (Neurosurgery)  Indicate any recent Medical Services you may have received from other than [facility] providers in the past year (date may be approximate).     Assessment:   This is a routine wellness examination for Heather Ramsey.  Hearing/Vision screen Hearing Screening - Comments:: Denies hearing difficulties   Vision Screening - Comments:: Wears reading glasses - up to date with routine eye exams with Dr. Marygrace Drought   Dietary issues and exercise activities discussed: Current Exercise Habits: Home exercise routine, Type of exercise: walking;stretching, Time (Minutes): 60, Frequency (Times/Week): 5, Weekly Exercise (Minutes/Week): 300, Intensity: Moderate   Goals Addressed             This Visit's Progress    My goal is  to lose weight around 5 pounds.         Depression Screen    06/27/2022    1:42 PM 06/02/2021    6:13 PM 06/10/2019   10:21 AM 03/10/2019    2:29 PM 05/07/2018    9:54 AM 07/30/2017    8:59 AM 05/02/2017   10:36 AM  PHQ 2/9 Scores  PHQ - 2 Score 1 0 1 0 1 0 0  PHQ- 9 Score   4    1    Fall Risk    06/27/2022    1:40 PM 10/10/2021    9:26 AM 06/02/2021    6:13 PM 06/10/2019   10:21 AM 03/10/2019    2:29 PM  [place] in the past year? 0 1 0 0 0  Number falls in past yr: 0 0 1 0 0  Injury with Fall? 0 0 0 0   Risk for fall due to : No Fall Risks No Fall Risks     Risk for fall due to: Comment  stepped off step ladder and fell     Follow up Falls prevention discussed Falls prevention discussed       FALL RISK PREVENTION PERTAINING TO THE HOME:  Any stairs in or around the home? No  If so, are there any without handrails? No  Home free of loose throw rugs in walkways, pet beds, electrical cords, etc? Yes  Adequate lighting in your home to reduce risk of falls? Yes   ASSISTIVE DEVICES UTILIZED TO PREVENT FALLS:  Life alert? No  Use of a cane, walker or w/c? No  Grab bars in the bathroom? Yes  Shower chair or bench in shower? Yes  Elevated toilet seat or a handicapped toilet? Yes   TIMED UP AND GO:  Was the test performed? No . PHONE VISIT   Cognitive Function:        06/27/2022    1:43 PM  6CIT Screen  What Year? 0 points  What month? 0 points  What time? 0 points  Count back from 20 0 points  Months in reverse 0 points  Repeat phrase 0 points  Total Score 0 points    Immunizations Immunization History  Administered Date(s) Administered   Fluad Quad(high Dose 65+) 07/01/2019   Influenza Whole 07/09/2007, 06/21/2010, 07/25/2012, 07/24/2013   Influenza, High Dose Seasonal PF 07/20/2014, 07/04/2015, 07/07/2018, 07/07/2018   Influenza,inj,Quad PF,6+ Mos 06/20/2017   Influenza-Unspecified 07/03/2016   Moderna Sars-Covid-2 Vaccination 11/06/2019, 12/04/2019   Pneumococcal Conjugate-13  08/31/2014, 11/15/2015   Pneumococcal Polysaccharide-23 05/27/2008, 10/21/2013   Td 01/01/2008   Zoster Recombinat (Shingrix) 02/06/2018, 04/11/2018   Zoster, Live 09/24/2008    TDAP status: Due, Education has been provided regarding the importance of this vaccine. Advised may receive this vaccine at local pharmacy or Health Dept. Aware to provide a copy of the vaccination record if obtained from local pharmacy or Health Dept. Verbalized acceptance and understanding.  Flu Vaccine status: Declined, Education has been provided regarding the importance of this vaccine but patient still declined. Advised may receive this vaccine at local pharmacy or Health Dept. Aware to provide a copy of the vaccination record if obtained from local pharmacy or Health Dept. Verbalized acceptance and understanding.  Pneumococcal vaccine status: Up to date  Covid-19 vaccine status: Completed vaccines  Qualifies for Shingles Vaccine? Yes   Zostavax completed Yes   Shingrix Completed?: Yes  Screening Tests Health Maintenance  Topic Date Due   TETANUS/TDAP  12/31/2017  DEXA SCAN  08/03/2019   COVID-19 Vaccine (3 - Moderna risk series) 01/01/2020   INFLUENZA VACCINE  04/24/2022   COLONOSCOPY (Pts 45-3yr Insurance coverage will need to be confirmed)  02/17/2025   Pneumonia Vaccine 75 Years old  Completed   Hepatitis C Screening  Completed   Zoster Vaccines- Shingrix  Completed   HPV VACCINES  Aged Out    Health Maintenance  Health Maintenance Due  Topic Date Due   TETANUS/TDAP  12/31/2017   DEXA SCAN  08/03/2019   COVID-19 Vaccine (3 - Moderna risk series) 01/01/2020   INFLUENZA VACCINE  04/24/2022    Colorectal cancer screening: Type of screening: Colonoscopy. Completed 02/18/2020. Repeat every 5 years  Mammogram status: Completed 10/04/2021. Repeat every year  Bone Density status: Patient declined; last done 10/02/2016  Lung Cancer Screening: (Low Dose CT Chest recommended if Age 75-80years,  30 pack-year currently smoking OR have quit w/in 15years.) does not qualify.   Lung Cancer Screening Referral: NO  Additional Screening:  Hepatitis C Screening: does qualify; Completed 11/15/2015  Vision Screening: Recommended annual ophthalmology exams for early detection of glaucoma and other disorders of the eye. Is the patient up to date with their annual eye exam?  Yes  Who is the provider or what is the name of the office in which the patient attends annual eye exams? MMarygrace Drought MD. If pt is not established with a provider, would they like to be referred to a provider to establish care? No .   Dental Screening: Recommended annual dental exams for proper oral hygiene  Community Resource Referral / Chronic Care Management: CRR required this visit?  No   CCM required this visit?  No      Plan:     I have personally reviewed and noted the following in the patient's chart:   Medical and social history Use of alcohol, tobacco or illicit drugs  Current medications and supplements including opioid prescriptions. Patient is not currently taking opioid prescriptions. Functional ability and status Nutritional status Physical activity Advanced directives List of other physicians Hospitalizations, surgeries, and ER visits in previous 12 months Vitals Screenings to include cognitive, depression, and falls Referrals and appointments  In addition, I have reviewed and discussed with patient certain preventive protocols, quality metrics, and best practice recommendations. A written personalized care plan for preventive services as well as general preventive health recommendations were provided to patient.     SSheral Flow LPN   [id number]  Nurse Notes: N/A

## 2022-06-27 NOTE — Telephone Encounter (Signed)
AWV-S completed.  Patient would like a refill on amlodipine and a rx for valtrex.  She has a fever blister for the last 2 days and and it is very painful.

## 2022-06-28 ENCOUNTER — Other Ambulatory Visit: Payer: Self-pay

## 2022-06-28 MED ORDER — AMLODIPINE BESYLATE 2.5 MG PO TABS: 2.5000 mg | ORAL_TABLET | Freq: Every day | ORAL | 3 refills | Status: DC

## 2022-06-28 MED ORDER — VALACYCLOVIR HCL 1 G PO TABS: ORAL_TABLET | ORAL | 5 refills | Status: AC

## 2022-06-28 NOTE — Telephone Encounter (Signed)
Amlodipine sent in for patient.   Can you send in the Valtrex?  I didn't see it on her current med list and looks like she hasn't had it in awhile.

## 2022-06-28 NOTE — Addendum Note (Signed)
Addended by: Binnie Rail on: 06/28/2022 12:29 PM   Modules accepted: Orders

## 2022-06-28 NOTE — Telephone Encounter (Signed)
Valtrex sent.

## 2022-07-18 ENCOUNTER — Encounter: Payer: Self-pay | Admitting: Internal Medicine

## 2022-10-03 ENCOUNTER — Other Ambulatory Visit: Payer: Self-pay | Admitting: Internal Medicine

## 2022-10-03 ENCOUNTER — Other Ambulatory Visit: Payer: Self-pay

## 2022-10-03 ENCOUNTER — Telehealth: Payer: Self-pay | Admitting: Internal Medicine

## 2022-10-03 MED ORDER — LOSARTAN POTASSIUM 50 MG PO TABS: 50.0000 mg | ORAL_TABLET | Freq: Every day | ORAL | 0 refills | Status: DC

## 2022-10-03 NOTE — Telephone Encounter (Signed)
Patient called and wants her Losartan sent in for 100 instead of 90.  She states that her insurance has changed and that they will pay for 100 instead of the 90 that you sent today

## 2022-10-03 NOTE — Telephone Encounter (Signed)
The new prescription has been sent to the pharmacy.

## 2022-10-08 DIAGNOSIS — Z1231 Encounter for screening mammogram for malignant neoplasm of breast: Secondary | ICD-10-CM | POA: Diagnosis not present

## 2022-10-08 LAB — HM MAMMOGRAPHY

## 2022-10-14 ENCOUNTER — Encounter: Payer: Self-pay | Admitting: Internal Medicine

## 2022-10-14 DIAGNOSIS — B009 Herpesviral infection, unspecified: Secondary | ICD-10-CM | POA: Insufficient documentation

## 2022-10-14 NOTE — Progress Notes (Signed)
Subjective:    Patient ID: Heather Ramsey, female    DOB: 1946/08/17, 77 y.o.   MRN: [id number]      HPI Heather Ramsey is here for a Physical exam.   Overall doing okay.   Medications and allergies reviewed with patient and updated if appropriate.  Current Outpatient Medications on File Prior to Visit  Medication Sig Dispense Refill   amLODipine (NORVASC) 2.5 MG tablet Take 1 tablet (2.5 mg total) by mouth daily. 90 tablet 3   ASHWAGANDHA PO Take 500 mg by mouth.     b complex vitamins tablet Take 1 tablet by mouth daily.     Calcium Carbonate-Vitamin D (CALCIUM + D PO) Take 1 tablet by mouth 2 (two) times daily.      Dietary Management Product (VASCULERA) TABS Take 1 tablet by mouth daily. 90 tablet 3   fish oil-omega-3 fatty acids 1000 MG capsule Take 1 g by mouth daily.      Ginger, Zingiber officinalis, (GINGER ROOT) 550 MG CAPS Take 1 capsule by mouth daily.     loratadine (CLARITIN) 10 MG tablet Take 10 mg by mouth daily.     losartan (COZAAR) 50 MG tablet Take 1 tablet (50 mg total) by mouth daily. 100 tablet 0   magnesium 30 MG tablet Take 30 mg by mouth 2 (two) times daily.     Misc Natural Products (TART CHERRY ADVANCED PO) Take by mouth daily.     Probiotic Product (PROBIOTIC-10 PO) Take by mouth daily.     valACYclovir (VALTREX) 1000 MG tablet Take 2 tabs Q 12 hrs x 1 day for outbreak 12 tablet 5   No current facility-administered medications on file prior to visit.    Review of Systems  Constitutional:  Negative for fever.  Eyes:  Negative for visual disturbance.  Respiratory:  Positive for cough (related to allergies). Negative for shortness of breath and wheezing.   Cardiovascular:  Positive for palpitations (occ at night when she lays certain ways) and leg swelling (mild at end of day). Negative for chest pain.  Gastrointestinal:  Positive for diarrhea (occ - food related). Negative for abdominal pain, blood in stool, constipation and nausea.       Occ gerd   Genitourinary:  Positive for frequency. Negative for dysuria and hematuria.       Urine odor, incontinence  Musculoskeletal:  Positive for arthralgias (feet , hands). Negative for back pain.  Skin:  Negative for rash.  Neurological:  Negative for light-headedness and headaches.  Psychiatric/Behavioral:  Dysphoric mood: mild, manageble. The patient is nervous/anxious (mild, manageble).        Objective:   Vitals:   10/16/22 0822  BP: 138/80  Pulse: (!) 59  Temp: 98.1 F (36.7 C)  SpO2: 98%   Filed Weights   10/16/22 0822  Weight: 137 lb (62.1 kg)   Body mass index is 25.84 kg/m.  BP Readings from Last 3 Encounters:  10/16/22 138/80  06/27/22 114/70  02/28/22 99/67    Wt Readings from Last 3 Encounters:  10/16/22 137 lb (62.1 kg)  06/27/22 136 lb (61.7 kg)  02/28/22 133 lb (60.3 kg)       Physical Exam Constitutional: She appears well-developed and well-nourished. No distress.  HENT:  Head: Normocephalic and atraumatic.  Right Ear: External ear normal. Normal ear canal and TM Left Ear: External ear normal.  Normal ear canal and TM Mouth/Throat: Oropharynx is clear and moist.  Eyes: Conjunctivae normal.  Neck: Neck supple.  No tracheal deviation present. No thyromegaly present.  No carotid bruit  Cardiovascular: Normal rate, regular rhythm and normal heart sounds.   No murmur heard.  No edema. Pulmonary/Chest: Effort normal and breath sounds normal. No respiratory distress. She has no wheezes. She has no rales.  Breast: deferred   Abdominal: Soft. She exhibits no distension. There is no tenderness.  Lymphadenopathy: She has no cervical adenopathy.  Skin: Skin is warm and dry. She is not diaphoretic.  Psychiatric: She has a normal mood and affect. Her behavior is normal.     Lab Results  Component Value Date   WBC 7.3 10/10/2021   HGB 13.7 10/10/2021   HCT 41.6 10/10/2021   PLT 240.0 10/10/2021   GLUCOSE 99 10/31/2021   CHOL 259 (H) 10/10/2021   TRIG  91.0 10/10/2021   HDL 76.60 10/10/2021   LDLDIRECT 186.4 10/21/2013   LDLCALC 164 (H) 10/10/2021   ALT 21 10/10/2021   AST 22 10/10/2021   NA 140 10/31/2021   K 4.3 10/31/2021   CL 100 10/31/2021   CREATININE 0.86 10/31/2021   BUN 14 10/31/2021   CO2 24 10/31/2021   TSH 2.55 03/22/2020   HGBA1C 5.7 10/10/2021    The 10-year ASCVD risk score (Arnett DK, et al., 2019) is: 27.3%   Values used to calculate the score:     Age: 30 years     Sex: Female     Is Non-Hispanic African American: No     Diabetic: No     Tobacco smoker: No     Systolic Blood Pressure: 759 mmHg     Is BP treated: Yes     HDL Cholesterol: 76.6 mg/dL     Total Cholesterol: 259 mg/dL      Assessment & Plan:   Physical exam: Screening blood work  ordered Exercise  walking, gym  - could do more-stressed the importance of exercise Weight normal Substance abuse  none   Reviewed recommended immunizations.   Health Maintenance  Topic Date Due   DTaP/Tdap/Td (2 - Tdap) 12/31/2017   DEXA SCAN  08/03/2019   COVID-19 Vaccine (3 - Moderna risk series) 11/01/2022 (Originally 01/01/2020)   Medicare Annual Wellness (AWV)  06/28/2023   COLONOSCOPY (Pts 45-68yr Insurance coverage will need to be confirmed)  02/17/2025   Pneumonia Vaccine 76 Years old  Completed   Hepatitis C Screening  Completed   Zoster Vaccines- Shingrix  Completed   HPV VACCINES  Aged Out   INFLUENZA VACCINE  Discontinued          See Problem List for Assessment and Plan of chronic medical problems.

## 2022-10-14 NOTE — Patient Instructions (Addendum)
Blood work was ordered.   The lab is on the first floor.    Medications changes include :  none     A referral was ordered for Uro-gynecology at [facility].     Someone will call you to schedule an appointment.    Return in about 1 year (around 10/17/2023) for Physical Exam.   Health Maintenance, Female Adopting a healthy lifestyle and getting preventive care are important in promoting health and wellness. Ask your health care provider about: The right schedule for you to have regular tests and exams. Things you can do on your own to prevent diseases and keep yourself healthy. What should I know about diet, weight, and exercise? Eat a healthy diet  Eat a diet that includes plenty of vegetables, fruits, low-fat dairy products, and lean protein. Do not eat a lot of foods that are high in solid fats, added sugars, or sodium. Maintain a healthy weight Body mass index (BMI) is used to identify weight problems. It estimates body fat based on height and weight. Your health care provider can help determine your BMI and help you achieve or maintain a healthy weight. Get regular exercise Get regular exercise. This is one of the most important things you can do for your health. Most adults should: Exercise for at least 150 minutes each week. The exercise should increase your heart rate and make you sweat (moderate-intensity exercise). Do strengthening exercises at least twice a week. This is in addition to the moderate-intensity exercise. Spend less time sitting. Even light physical activity can be beneficial. Watch cholesterol and blood lipids Have your blood tested for lipids and cholesterol at 76 years of age, then have this test every 5 years. Have your cholesterol levels checked more often if: Your lipid or cholesterol levels are high. You are older than 76 years of age. You are at high risk for heart disease. What should I know about cancer screening? Depending on your health  history and family history, you may need to have cancer screening at various ages. This may include screening for: Breast cancer. Cervical cancer. Colorectal cancer. Skin cancer. Lung cancer. What should I know about heart disease, diabetes, and high blood pressure? Blood pressure and heart disease High blood pressure causes heart disease and increases the risk of stroke. This is more likely to develop in people who have high blood pressure readings or are overweight. Have your blood pressure checked: Every 3-5 years if you are 42-31 years of age. Every year if you are 71 years old or older. Diabetes Have regular diabetes screenings. This checks your fasting blood sugar level. Have the screening done: Once every three years after age 64 if you are at a normal weight and have a low risk for diabetes. More often and at a younger age if you are overweight or have a high risk for diabetes. What should I know about preventing infection? Hepatitis B If you have a higher risk for hepatitis B, you should be screened for this virus. Talk with your health care provider to find out if you are at risk for hepatitis B infection. Hepatitis C Testing is recommended for: Everyone born from [birth year] through 1964. Anyone with known risk factors for hepatitis C. Sexually transmitted infections (STIs) Get screened for STIs, including gonorrhea and chlamydia, if: You are sexually active and are younger than 76 years of age. You are older than 76 years of age and your health care provider tells you that you  are at risk for this type of infection. Your sexual activity has changed since you were last screened, and you are at increased risk for chlamydia or gonorrhea. Ask your health care provider if you are at risk. Ask your health care provider about whether you are at high risk for HIV. Your health care provider may recommend a prescription medicine to help prevent HIV infection. If you choose to take medicine to  prevent HIV, you should first get tested for HIV. You should then be tested every 3 months for as long as you are taking the medicine. Pregnancy If you are about to stop having your period (premenopausal) and you may become pregnant, seek counseling before you get pregnant. Take 400 to 800 micrograms (mcg) of folic acid every day if you become pregnant. Ask for birth control (contraception) if you want to prevent pregnancy. Osteoporosis and menopause Osteoporosis is a disease in which the bones lose minerals and strength with aging. This can result in bone fractures. If you are 34 years old or older, or if you are at risk for osteoporosis and fractures, ask your health care provider if you should: Be screened for bone loss. Take a calcium or vitamin D supplement to lower your risk of fractures. Be given hormone replacement therapy (HRT) to treat symptoms of menopause. Follow these instructions at home: Alcohol use Do not drink alcohol if: Your health care provider tells you not to drink. You are pregnant, may be pregnant, or are planning to become pregnant. If you drink alcohol: Limit how much you have to: 0-1 drink a day. Know how much alcohol is in your drink. In the [place] one drink equals one 12 oz bottle of beer (355 mL), one 5 oz glass of wine (148 mL), or one 1 oz glass of hard liquor (44 mL). Lifestyle Do not use any products that contain nicotine or tobacco. These products include cigarettes, chewing tobacco, and vaping devices, such as e-cigarettes. If you need help quitting, ask your health care provider. Do not use street drugs. Do not share needles. Ask your health care provider for help if you need support or information about quitting drugs. General instructions Schedule regular health, dental, and eye exams. Stay current with your vaccines. Tell your health care provider if: You often feel depressed. You have ever been abused or do not feel safe at  home. Summary Adopting a healthy lifestyle and getting preventive care are important in promoting health and wellness. Follow your health care provider's instructions about healthy diet, exercising, and getting tested or screened for diseases. Follow your health care provider's instructions on monitoring your cholesterol and blood pressure. This information is not intended to replace advice given to you by your health care provider. Make sure you discuss any questions you have with your health care provider. Document Revised: 01/30/2021 Document Reviewed: 01/30/2021 Elsevier Patient Education  [employer].

## 2022-10-16 ENCOUNTER — Other Ambulatory Visit: Payer: Self-pay | Admitting: *Deleted

## 2022-10-16 ENCOUNTER — Ambulatory Visit (INDEPENDENT_AMBULATORY_CARE_PROVIDER_SITE_OTHER): Payer: PPO | Admitting: Internal Medicine

## 2022-10-16 ENCOUNTER — Encounter: Payer: Self-pay | Admitting: Internal Medicine

## 2022-10-16 VITALS — BP 138/80 | HR 59 | Temp 98.1°F | Ht 61.05 in | Wt 137.0 lb

## 2022-10-16 DIAGNOSIS — R739 Hyperglycemia, unspecified: Secondary | ICD-10-CM

## 2022-10-16 DIAGNOSIS — R8290 Unspecified abnormal findings in urine: Secondary | ICD-10-CM

## 2022-10-16 DIAGNOSIS — M810 Age-related osteoporosis without current pathological fracture: Secondary | ICD-10-CM

## 2022-10-16 DIAGNOSIS — B009 Herpesviral infection, unspecified: Secondary | ICD-10-CM

## 2022-10-16 DIAGNOSIS — I10 Essential (primary) hypertension: Secondary | ICD-10-CM

## 2022-10-16 DIAGNOSIS — R32 Unspecified urinary incontinence: Secondary | ICD-10-CM | POA: Diagnosis not present

## 2022-10-16 DIAGNOSIS — Z0000 Encounter for general adult medical examination without abnormal findings: Secondary | ICD-10-CM

## 2022-10-16 DIAGNOSIS — E7849 Other hyperlipidemia: Secondary | ICD-10-CM

## 2022-10-16 DIAGNOSIS — I872 Venous insufficiency (chronic) (peripheral): Secondary | ICD-10-CM | POA: Diagnosis not present

## 2022-10-16 DIAGNOSIS — I77810 Thoracic aortic ectasia: Secondary | ICD-10-CM

## 2022-10-16 LAB — URINALYSIS, ROUTINE W REFLEX MICROSCOPIC
Bilirubin Urine: NEGATIVE
Ketones, ur: NEGATIVE
Nitrite: NEGATIVE
RBC / HPF: NONE SEEN (ref 0–?)
Specific Gravity, Urine: 1.01 (ref 1.000–1.030)
Total Protein, Urine: NEGATIVE
Urine Glucose: NEGATIVE
Urobilinogen, UA: 0.2 (ref 0.0–1.0)
pH: 7 (ref 5.0–8.0)

## 2022-10-16 LAB — COMPREHENSIVE METABOLIC PANEL
ALT: 12 U/L (ref 0–35)
AST: 15 U/L (ref 0–37)
Albumin: 4.5 g/dL (ref 3.5–5.2)
Alkaline Phosphatase: 57 U/L (ref 39–117)
BUN: 13 mg/dL (ref 6–23)
CO2: 29 mEq/L (ref 19–32)
Calcium: 9.7 mg/dL (ref 8.4–10.5)
Chloride: 101 mEq/L (ref 96–112)
Creatinine, Ser: 0.76 mg/dL (ref 0.40–1.20)
GFR: 76.32 mL/min (ref >60.00)
Glucose, Bld: 88 mg/dL (ref 70–99)
Potassium: 3.9 mEq/L (ref 3.5–5.1)
Sodium: 139 mEq/L (ref 135–145)
Total Bilirubin: 0.5 mg/dL (ref 0.2–1.2)
Total Protein: 7.3 g/dL (ref 6.0–8.3)

## 2022-10-16 LAB — CBC WITH DIFFERENTIAL/PLATELET
Basophils Absolute: 0 10*3/uL (ref 0.0–0.1)
Basophils Relative: 0.7 % (ref 0.0–3.0)
Eosinophils Absolute: 0.1 10*3/uL (ref 0.0–0.7)
Eosinophils Relative: 1.4 % (ref 0.0–5.0)
HCT: 40 % (ref 36.0–46.0)
Hemoglobin: 13.6 g/dL (ref 12.0–15.0)
Lymphocytes Relative: 21.7 % (ref 12.0–46.0)
Lymphs Abs: 1.3 10*3/uL (ref 0.7–4.0)
MCHC: 34.1 g/dL (ref 30.0–36.0)
MCV: 89.5 fl (ref 78.0–100.0)
Monocytes Absolute: 0.4 10*3/uL (ref 0.1–1.0)
Monocytes Relative: 7.4 % (ref 3.0–12.0)
Neutro Abs: 4.1 10*3/uL (ref 1.4–7.7)
Neutrophils Relative %: 68.8 % (ref 43.0–77.0)
Platelets: 301 10*3/uL (ref 150.0–400.0)
RBC: 4.47 Mil/uL (ref 3.87–5.11)
RDW: 13.5 % (ref 11.5–15.5)
WBC: 5.9 10*3/uL (ref 4.0–10.5)

## 2022-10-16 LAB — LIPID PANEL
Cholesterol: 235 mg/dL — ABNORMAL HIGH (ref 0–200)
HDL: 71.1 mg/dL (ref >39.00)
LDL Cholesterol: 146 mg/dL — ABNORMAL HIGH (ref 0–99)
NonHDL: 164.02
Total CHOL/HDL Ratio: 3
Triglycerides: 90 mg/dL (ref 0.0–149.0)
VLDL: 18 mg/dL (ref 0.0–40.0)

## 2022-10-16 LAB — TSH: TSH: 2.66 u[IU]/mL (ref 0.35–5.50)

## 2022-10-16 LAB — HEMOGLOBIN A1C: Hgb A1c MFr Bld: 5.7 % (ref 4.6–6.5)

## 2022-10-16 MED ORDER — METAMUCIL 28.3 % PO POWD: ORAL | Status: AC

## 2022-10-16 NOTE — Assessment & Plan Note (Signed)
Chronic Continue Vasculera 1 tablet daily-she feels this is helping

## 2022-10-16 NOTE — Assessment & Plan Note (Signed)
Chronic Check a1c Low sugar / carb diet Stressed regular exercise   

## 2022-10-16 NOTE — Assessment & Plan Note (Signed)
Chronic Blood pressure well controlled CMP Continue amlodipine 2.5 mg daily 50 mg daily

## 2022-10-16 NOTE — Assessment & Plan Note (Signed)
Chronic Intermittent flares Continue Valtrex as needed

## 2022-10-16 NOTE — Assessment & Plan Note (Addendum)
Chronic Regular exercise and healthy diet encouraged Check lipid panel  CAC score 7 in 09/2020 Continue lifestyle control

## 2022-10-16 NOTE — Assessment & Plan Note (Signed)
Chronic DEXA due-done at [facility] in the past Discussed getting another bone density and that most likely she will still have osteoporosis and it would be recommended that she consider treatment, which she does not want-therefore has the referred DEXA Stressed regular exercise, fall prevention Continue calcium and vitamin D daily Check vitamin D level

## 2022-10-17 LAB — URINE CULTURE

## 2022-10-18 ENCOUNTER — Encounter: Payer: Self-pay | Admitting: Internal Medicine

## 2022-10-18 NOTE — Progress Notes (Signed)
Outside notes received. Information abstracted. Notes sent to scan. 

## 2022-11-06 ENCOUNTER — Telehealth: Payer: Self-pay | Admitting: Internal Medicine

## 2022-11-06 ENCOUNTER — Other Ambulatory Visit: Payer: Self-pay

## 2022-11-06 DIAGNOSIS — I872 Venous insufficiency (chronic) (peripheral): Secondary | ICD-10-CM

## 2022-11-06 MED ORDER — VASCULERA PO TABS: 1.0000 | ORAL_TABLET | Freq: Every day | ORAL | 3 refills | Status: DC

## 2022-11-06 NOTE — Telephone Encounter (Signed)
Caller & Relationship to patient:  self   Call back number: [phone]   Date of last office visit:   Date of next office visit:   Medication(s) to be refilled:  Vasculera        Preferred Pharmacy:  [facility]

## 2022-11-06 NOTE — Telephone Encounter (Signed)
Script sent in today.  Contacted patient and correct pharmacy given.

## 2022-11-12 ENCOUNTER — Encounter: Payer: Self-pay | Admitting: *Deleted

## 2022-11-16 NOTE — Telephone Encounter (Signed)
This rx was not received.  It needs to be faxed to:  [phone]

## 2022-11-19 ENCOUNTER — Other Ambulatory Visit: Payer: Self-pay

## 2022-11-19 DIAGNOSIS — I872 Venous insufficiency (chronic) (peripheral): Secondary | ICD-10-CM

## 2022-11-19 MED ORDER — VASCULERA PO TABS: 1.0000 | ORAL_TABLET | Freq: Every day | ORAL | 3 refills | Status: DC

## 2022-11-19 NOTE — Telephone Encounter (Signed)
Fax resent today.

## 2022-11-27 ENCOUNTER — Ambulatory Visit
Admission: RE | Admit: 2022-11-27 | Discharge: 2022-11-27 | Disposition: A | Discharge status: Home / Self Care | Payer: HMO | Source: Ambulatory Visit | Attending: Cardiology | Admitting: Cardiology

## 2022-11-27 DIAGNOSIS — I779 Disorder of arteries and arterioles, unspecified: Secondary | ICD-10-CM | POA: Diagnosis not present

## 2022-11-27 DIAGNOSIS — I77810 Thoracic aortic ectasia: Secondary | ICD-10-CM

## 2022-11-27 DIAGNOSIS — I712 Thoracic aortic aneurysm, without rupture, unspecified: Secondary | ICD-10-CM | POA: Diagnosis not present

## 2022-11-27 DIAGNOSIS — N281 Cyst of kidney, acquired: Secondary | ICD-10-CM | POA: Diagnosis not present

## 2022-11-27 DIAGNOSIS — I359 Nonrheumatic aortic valve disorder, unspecified: Secondary | ICD-10-CM | POA: Diagnosis not present

## 2022-11-27 MED ORDER — IOPAMIDOL (ISOVUE-370) INJECTION 76%
75.0000 mL | Freq: Once | INTRAVENOUS | Status: AC | PRN
  Administered 2022-11-27: 75 mL via INTRAVENOUS

## 2022-12-03 ENCOUNTER — Encounter: Payer: Self-pay | Admitting: *Deleted
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
## 2022-12-07 ENCOUNTER — Encounter: Payer: Self-pay | Admitting: Cardiology

## 2022-12-07 ENCOUNTER — Ambulatory Visit: Payer: HMO | Attending: Cardiology | Admitting: Cardiology

## 2022-12-07 VITALS — BP 116/84 | HR 67 | Ht 61.0 in | Wt 132.8 lb

## 2022-12-07 DIAGNOSIS — I77810 Thoracic aortic ectasia: Secondary | ICD-10-CM | POA: Diagnosis not present

## 2022-12-07 DIAGNOSIS — E785 Hyperlipidemia, unspecified: Secondary | ICD-10-CM

## 2022-12-07 DIAGNOSIS — R9431 Abnormal electrocardiogram [ECG] [EKG]: Secondary | ICD-10-CM

## 2022-12-07 NOTE — Patient Instructions (Signed)
Medication Instructions:  Your physician recommends that you continue on your current medications as directed. Please refer to the Current Medication list given to you today.  *If you need a refill on your cardiac medications before your next appointment, please call your pharmacy*  Testing/Procedures: CTA chest/aorta in 2 YEARS (MARCH 2026)  Follow-Up: At [facility], you and your health needs are our priority.  As part of our continuing mission to provide you with exceptional heart care, we have created designated Provider Care Teams.  These Care Teams include your primary Cardiologist (physician) and Advanced Practice Providers (APPs -  Physician Assistants and Nurse Practitioners) who all work together to provide you with the care you need, when you need it.  We recommend signing up for the patient portal called "MyChart".  Sign up information is provided on this After Visit Summary.  MyChart is used to connect with patients for Virtual Visits (Telemedicine).  Patients are able to view lab/test results, encounter notes, upcoming appointments, etc.  Non-urgent messages can be sent to your provider as well.   To learn more about what you can do with MyChart, go to [email or web address].    Your next appointment:   2 year(s)  Provider:   Minus Breeding, MD

## 2023-01-06 ENCOUNTER — Other Ambulatory Visit: Payer: Self-pay | Admitting: Internal Medicine

## 2023-01-07 ENCOUNTER — Other Ambulatory Visit: Payer: Self-pay

## 2023-01-08 DIAGNOSIS — H52203 Unspecified astigmatism, bilateral: Secondary | ICD-10-CM | POA: Diagnosis not present

## 2023-01-08 DIAGNOSIS — H43813 Vitreous degeneration, bilateral: Secondary | ICD-10-CM | POA: Diagnosis not present

## 2023-01-08 DIAGNOSIS — H0100B Unspecified blepharitis left eye, upper and lower eyelids: Secondary | ICD-10-CM | POA: Diagnosis not present

## 2023-01-08 DIAGNOSIS — H0100A Unspecified blepharitis right eye, upper and lower eyelids: Secondary | ICD-10-CM | POA: Diagnosis not present

## 2023-01-08 DIAGNOSIS — H26493 Other secondary cataract, bilateral: Secondary | ICD-10-CM | POA: Diagnosis not present

## 2023-01-08 DIAGNOSIS — H524 Presbyopia: Secondary | ICD-10-CM | POA: Diagnosis not present

## 2023-01-08 DIAGNOSIS — H04123 Dry eye syndrome of bilateral lacrimal glands: Secondary | ICD-10-CM | POA: Diagnosis not present

## 2023-01-15 DIAGNOSIS — H26492 Other secondary cataract, left eye: Secondary | ICD-10-CM | POA: Diagnosis not present

## 2023-01-16 ENCOUNTER — Other Ambulatory Visit (HOSPITAL_COMMUNITY)
Admission: RE | Admit: 2023-01-16 | Discharge: 2023-01-16 | Disposition: A | Discharge status: Home / Self Care | Payer: HMO | Source: Other Acute Inpatient Hospital | Attending: Obstetrics and Gynecology | Admitting: Obstetrics and Gynecology

## 2023-01-16 ENCOUNTER — Encounter: Payer: Self-pay | Admitting: Obstetrics and Gynecology

## 2023-01-16 ENCOUNTER — Ambulatory Visit (INDEPENDENT_AMBULATORY_CARE_PROVIDER_SITE_OTHER): Payer: HMO | Admitting: Obstetrics and Gynecology

## 2023-01-16 VITALS — BP 133/84 | HR 67 | Ht 59.0 in | Wt 135.0 lb

## 2023-01-16 DIAGNOSIS — R82998 Other abnormal findings in urine: Secondary | ICD-10-CM

## 2023-01-16 DIAGNOSIS — R159 Full incontinence of feces: Secondary | ICD-10-CM

## 2023-01-16 DIAGNOSIS — R350 Frequency of micturition: Secondary | ICD-10-CM | POA: Diagnosis not present

## 2023-01-16 DIAGNOSIS — N3941 Urge incontinence: Secondary | ICD-10-CM | POA: Diagnosis not present

## 2023-01-16 DIAGNOSIS — N393 Stress incontinence (female) (male): Secondary | ICD-10-CM | POA: Diagnosis not present

## 2023-01-16 DIAGNOSIS — R339 Retention of urine, unspecified: Secondary | ICD-10-CM

## 2023-01-16 LAB — POCT URINALYSIS DIPSTICK
Bilirubin, UA: NEGATIVE
Glucose, UA: NEGATIVE
Ketones, UA: NEGATIVE
Nitrite, UA: NEGATIVE
Protein, UA: NEGATIVE
Spec Grav, UA: 1.015 (ref 1.010–1.025)
Urobilinogen, UA: 0.2 E.U./dL
pH, UA: 7 (ref 5.0–8.0)
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
## 2023-01-16 NOTE — Patient Instructions (Addendum)
URODYNAMICS (UDS) TEST INFORMATION  IMPORTANT: Please try to arrive with a comfortably full bladder!    What is UDS? Urodynamics is a bladder test used to evaluate how your bladder and urethra (tube you urinate out of) work to help find out the cause of your bladder symptoms and evaluate your bladder function in order to make the best treatment plan for you.   What to expect? A nurse will perform the test and will be with you during the entire exam. First we will have to empty your bladder on a special toilet.  After you have emptied your bladder, very small catheters (plastic tubing) will be placed into your bladder and into your vagina (or rectum). These special small catheters measure pressure to help measure your bladder function.  Your bladder will be gently filled with water and you will be asked to cough and strain at several different points during the test.   You will then be asked to empty your bladder in the special toilet with the catheters in place. Most patients can urinate (pee) easily with the catheters in place since the catheters are so small. In total this procedure lasts about 45 minutes to 1 hour.  After your test is completed, you will return (or possibly be seen the same day) to review the results, talk about treatment options and make a plan moving forward.   Accidental Bowel Leakage: Our goal is to achieve formed bowel movements daily or every-other-day without leakage.  You may need to try different combinations of the following options to find what works best for you.  Some management options include: Dietary changes (more leafy greens, vegetables and fruits; less processed foods) Fiber supplementation (Metamucil or something with psyllium as active ingredient) Over-the-counter imodium (tablets or liquid) to help solidify the stool and prevent leakage of stool. If you get constipated you can use Miralax as needed to achieve bowel movements.

## 2023-01-17 LAB — URINE CULTURE: Culture: NO GROWTH

## 2023-01-27 ENCOUNTER — Encounter: Payer: Self-pay | Admitting: Internal Medicine

## 2023-02-12 NOTE — Progress Notes (Signed)
[facility] Urodynamics Procedure  Referring Physician: Pincus Sanes, MD Date of Procedure: 02/13/2023  Heather Ramsey is a 77 y.o. female who presents for urodynamic evaluation. Indication(s) for study: Incomplete Bladder Emptying   Vital Signs: BP 129/79   Pulse 76   Laboratory Results: A catheterized urine specimen revealed:  POC urine: Negative for all components   Voiding Diary: Not done  Procedure Timeout:  The correct patient was verified and the correct procedure was verified. The patient was in the correct position and safety precautions were reviewed based on at the patient's history.  Urodynamic Procedure A 43F dual lumen urodynamics catheter was placed under sterile conditions into the patient's bladder. A 43F catheter was placed into the rectum in order to measure abdominal pressure. EMG patches were placed in the appropriate position.  All connections were confirmed and calibrations/adjusted made. Saline was instilled into the bladder through the dual lumen catheters.  Cough/valsalva pressures were measured periodically during filling.  Patient was allowed to void.  The bladder was then emptied of its residual.  UROFLOW: Revealed a Qmax of 12.1 mL/sec.  She voided 234 mL and had a residual of 275 mL.  It was a intermittent pattern and represented normal habits.  CMG: This was performed with sterile water in the sitting position at a fill rate of 30-40 mL/min.    First sensation of fullness was 246 mLs,  First urge was 416 mLs,  Strong urge was 436 mLs and  Capacity was 1041 mLs  Stress incontinence was demonstrated Highest positive Barrier CLPP was 102 cmH20 at 277 ml. Highest positive Barrier VLPP was 59 cmH20 at 277 ml.  Detrusor function was overactive, with phasic contractions seen.  The first occurred at 198 mL to 3.7 cm of water and was not associated with urge.  Compliance:  0. End fill detrusor pressure was 0cmH20.    UPP: MUCP  with barrier reduction was 61 cm of water.    MICTURITION STUDY: Voiding was performed with reduction using scopettes in the sitting position.  Pdet at Qmax was 11.7 cm of water.  Qmax was 13.7 mL/sec.  It was a intermittent pattern.  She voided 891 mL and had a residual of 150 mL.  It was a volitional void, sustained detrusor contraction was present and abdominal straining was present  EMG: This was performed with patches.  She had voluntary contractions, recruitment with fill was not present and urethral sphincter was relaxed with void.  The details of the procedure with the study tracings have been scanned into EPIC.   Urodynamic Impression:  1. Sensation was reduced; capacity was increased 2. Stress Incontinence was demonstrated at normal pressures; 3. Detrusor Overactivity was demonstrated without leakage. 4. Emptying was dysfunctional with a normal PVR (150), a sustained detrusor contraction present,  abdominal straining present, normal urethral sphincter activity on EMG.  Plan: - The patient will follow up  to discuss the findings and treatment options.

## 2023-02-13 ENCOUNTER — Ambulatory Visit (INDEPENDENT_AMBULATORY_CARE_PROVIDER_SITE_OTHER): Payer: HMO | Admitting: Obstetrics and Gynecology

## 2023-02-13 ENCOUNTER — Encounter: Payer: Self-pay | Admitting: Obstetrics and Gynecology

## 2023-02-13 VITALS — BP 129/79 | HR 76

## 2023-02-13 DIAGNOSIS — R350 Frequency of micturition: Secondary | ICD-10-CM | POA: Diagnosis not present

## 2023-02-13 DIAGNOSIS — R339 Retention of urine, unspecified: Secondary | ICD-10-CM | POA: Diagnosis not present

## 2023-02-13 DIAGNOSIS — N393 Stress incontinence (female) (male): Secondary | ICD-10-CM

## 2023-02-13 LAB — POCT URINALYSIS DIPSTICK
Bilirubin, UA: NEGATIVE
Blood, UA: NEGATIVE
Glucose, UA: NEGATIVE
Ketones, UA: NEGATIVE
Leukocytes, UA: NEGATIVE
Nitrite, UA: NEGATIVE
Protein, UA: NEGATIVE
Spec Grav, UA: 1.015 (ref 1.010–1.025)
Urobilinogen, UA: 0.2 E.U./dL
pH, UA: 7 (ref 5.0–8.0)

## 2023-02-13 NOTE — Patient Instructions (Signed)
Taking Care of Yourself after Urodynamics  Drink plenty of water for a day or two following your procedure. Try to have about 8 ounces (one cup) at a time, and do this 6 times or more per day unless you have fluid restrictitons AVOID irritative beverages such as coffee, tea, soda, alcoholic or citrus drinks for a day or two, as this may cause burning with urination. You may experience some discomfort or a burning sensation with urination after having this procedure. You can use over the counter Azo or pyridium to help with burning and follow the instructions on the packaging. If it does not improve within 1-2 days, or other symptoms appear (fever, chills, or difficulty urinating) call the office to speak to a nurse.  You may return to normal daily activities such as work, school, driving, exercising and housework on the day of the procedure. If your doctor gave you a prescription, take it as ordered.     Your urodynamics test did show stress incontinence. You were able to empty your bladder today relatively well with less than in the bladder which is good. We do not need to schedule an ultrasound at this time but I do want you to follow up and discuss options for procedures (Urethral bulking and SNM) with Dr. Florian Buff.

## 2023-02-19 DIAGNOSIS — H26491 Other secondary cataract, right eye: Secondary | ICD-10-CM | POA: Diagnosis not present

## 2023-02-22 ENCOUNTER — Ambulatory Visit: Payer: HMO | Admitting: Obstetrics and Gynecology

## 2023-03-06 ENCOUNTER — Other Ambulatory Visit: Payer: Self-pay

## 2023-03-06 ENCOUNTER — Ambulatory Visit: Payer: HMO | Attending: Obstetrics and Gynecology | Admitting: Physical Therapy

## 2023-03-06 ENCOUNTER — Encounter: Payer: Self-pay | Admitting: Physical Therapy

## 2023-03-06 DIAGNOSIS — M62838 Other muscle spasm: Secondary | ICD-10-CM | POA: Diagnosis not present

## 2023-03-06 DIAGNOSIS — M6281 Muscle weakness (generalized): Secondary | ICD-10-CM | POA: Diagnosis not present

## 2023-03-06 DIAGNOSIS — R279 Unspecified lack of coordination: Secondary | ICD-10-CM | POA: Diagnosis not present

## 2023-03-06 NOTE — Therapy (Signed)
OUTPATIENT PHYSICAL THERAPY FEMALE PELVIC EVALUATION   Patient Name: Heather Ramsey MRN: [id number] DOB:11-Apr-1946, 77 y.o., female Today's Date: 03/06/2023  END OF SESSION:  PT End of Session - 03/06/23 0857     Visit Number 1    Date for PT Re-Evaluation 05/29/23    Authorization Type [employer]    PT Start Time 0848    PT Stop Time 0928    PT Time Calculation (min) 40 min    Activity Tolerance Patient tolerated treatment well    Behavior During Therapy [facility] for tasks assessed/performed             Past Medical History:  Diagnosis Date   Atrophic vaginitis    Diverticulosis of colon 2005& 2010   FH colon cancer   Fibromyalgia    Dr Corliss Skains   GERD (gastroesophageal reflux disease)    Hemorrhoid    Hyperlipidemia    Hypertension    IBS (irritable bowel syndrome)    Migraine    MVP (mitral valve prolapse)    Osteopenia    Pulmonary nodule 07/2006   Past Surgical History:  Procedure Laterality Date   back surgy  02/26/2017   lumbar 4-5   BLADDER SURGERY  1995   BREAST BIOPSY Left 2003   [facility] PROCEDURE  1995   with bladder tack    CATARACT EXTRACTION, BILATERAL  05/2019   COLONOSCOPY  07/2014   negative; Dr Juanda Chance   FOOT SURGERY Left 2014   Patient Active Problem List   Diagnosis Date Noted   Herpes 10/14/2022   Aortic root dilatation (HCC) 10/30/2021   Thoracic aortic aneurysm, mild 10/10/2021   Nausea and vomiting 10/10/2021   RAD (reactive airway disease) 12/29/2019   Cough 03/17/2018   Cardiac murmur - aortic valve thickening 01/07/2018   Osteoporosis 08/18/2017   Hyperglycemia 07/30/2017   Venous stasis dermatitis of both lower extremities 04/09/2017   Spondylolisthesis at L4-L5 level 02/26/2017   Fibromyalgia 01/03/2017   Primary osteoarthritis of both hands 01/03/2017   Trochanteric bursitis of both hips 01/03/2017   Hypertension 12/29/2016   Spinal stenosis of lumbar region with neurogenic claudication 09/04/2016   Lumbar  radiculopathy 09/04/2016   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   Abnormal EKG 02/05/2012   History of basal cell carcinoma 04/24/2011   DIVERTICULOSIS, COLON 06/09/2009   Hyperlipidemia 05/31/2008   IRRITABLE BOWEL SYNDROME 05/27/2008   Chronic fatigue syndrome 05/27/2008    PCP: Pincus Sanes, MD   REFERRING PROVIDER: Marguerita Beards, MD   REFERRING DIAG: N39.41 (ICD-10-CM) - Urge incontinenceN39.3 (ICD-10-CM) - SUI (stress urinary incontinence, female)R15.9 (ICD-10-CM) - Incontinence of feces, unspecified fecal incontinence typeR33.9 (ICD-10-CM) - Incomplete bladder emptying  THERAPY DIAG:  Other muscle spasm  Unspecified lack of coordination  Muscle weakness (generalized)  Rationale for Evaluation and Treatment: Rehabilitation  ONSET DATE: over time  SUBJECTIVE:  SUBJECTIVE STATEMENT: Pad use: 2-4 pads per day and damp day/night, pt had mesh surgery in 1995 and it was messed up, now . Pt does exercises for back daily.  Manages back pain. Fluid intake: Yes: a lot of water    PAIN:  Are you having pain? No   PRECAUTIONS: None  WEIGHT BEARING RESTRICTIONS: No  FALLS:  Has patient fallen in last 6 months? No  LIVING ENVIRONMENT: Lives with: lives with their spouse Lives in: House/apartment   OCCUPATION: no, walking and exercise  PLOF: Independent  PATIENT GOALS: control leakage  PERTINENT HISTORY:  PMH: fibromyalgia, IBS, GERD, migraine, back surgery L4/5, bladder surgery 1995 Sexual abuse: No  BOWEL MOVEMENT: Pain with bowel movement: No Type of bowel movement:Type (Bristol Stool Scale) loose to small and hard, Frequency sometimes a lot in one day, sometimes every other day, and Strain Yes sometimes with constipation has IBS Fully empty rectum: No  sometimes no Leakage: Yes: not much every other day Pads: Yes:   Fiber supplement: No  URINATION: Pain with urination: No Fully empty bladder: No Stream: Strong (have to lean forward Urgency: Yes:   Frequency: nocturia 3x, daytime 6-8x Leakage: Walking to the bathroom, Coughing, Sneezing, and damp Pads: Yes: 2-4 long pads  INTERCOURSE: Pain with intercourse: not currently  PREGNANCY: Vaginal deliveries 2 PROLAPSE: Cystocele - no sure   OBJECTIVE:   DIAGNOSTIC FINDINGS:    PATIENT SURVEYS:    PFIQ-7   COGNITION: Overall cognitive status: Within functional limits for tasks assessed     SENSATION: Light touch:  Proprioception:   MUSCLE LENGTH: Hamstrings: Right ~70 deg; Left ~80 deg   LUMBAR SPECIAL TESTS:    FUNCTIONAL TESTS:  Single leg Left trendelenburg  GAIT:  Comments: stiff and decreased trunk rotation  POSTURE: rounded shoulders, increased lumbar lordosis, increased thoracic kyphosis, and anterior pelvic tilt  PELVIC ALIGNMENT:  LUMBARAROM/PROM:  A/PROM A/PROM  eval  Flexion 75%  Extension   Right lateral flexion   Left lateral flexion   Right rotation   Left rotation    (Blank rows = not tested)  LOWER EXTREMITY ROM:  Passive ROM Right eval Left eval  Hip flexion [facility]  [facility]   Hip extension    Hip abduction    Hip adduction    Hip internal rotation 75% WFL   Hip external rotation 75% 80%  Knee flexion    Knee extension    Ankle dorsiflexion    Ankle plantarflexion    Ankle inversion    Ankle eversion     (Blank rows = not tested)  LOWER EXTREMITY MMT:  MMT Right eval Left eval  Hip flexion    Hip extension    Hip abduction 4/5 4-/5  Hip adduction 5/5 4/5  Hip internal rotation    Hip external rotation    Knee flexion    Knee extension    Ankle dorsiflexion    Ankle plantarflexion    Ankle inversion    Ankle eversion     PALPATION:   General  lumbar and thoracic paraspinals tight                External  Perineal Exam posterior prolapse noted to introitus at rest in supine, slightly more pale color, dryness                             Internal Pelvic Floor ileococcygeus and obturator internus tight bil Rt more than Lt  Patient confirms identification and approves PT to assess internal pelvic floor and treatment Yes  PELVIC MMT:   MMT eval  Vaginal 3/5 (after doing release of tension) - 1 rep x 1 sec  Internal Anal Sphincter   External Anal Sphincter   Puborectalis   Diastasis Recti   (Blank rows = not tested)        TONE: High posterior  PROLAPSE: Posterior wall  TODAY'S TREATMENT:                                                                                                                              DATE: 03/06/23  EVAL and initial HEP of diaphragmatic breathing and moisturizer demo and samples given   PATIENT EDUCATION:  Education details: Deep breathing and moisturizing Person educated: Patient Education method: Programmer, multimedia, Facilities manager, and Handouts Education comprehension: verbalized understanding  HOME EXERCISE PROGRAM: Deep breathing and moisturizing  ASSESSMENT:  CLINICAL IMPRESSION: Patient is a 77 y.o. female who was seen today for physical therapy evaluation and treatment for incontinence and pelvic pain.  Pt has very weak squeeze but improved to 2-3/5 after release of muscle tension.  Pt has multiple co-morbidities contributing to muscle tension and lack of trunk coordination.  Pt has hip and core weakness as noted above and muscle tension as noted.  Pt will benefit from skilled PT to address these impairments and improve muscle tone and coordination of pelvic floor with diaphragm and core during functional activities as well as addressing pain management.  OBJECTIVE IMPAIRMENTS: decreased coordination, decreased endurance, decreased ROM, decreased strength, increased muscle spasms, impaired flexibility, impaired tone, postural dysfunction, and pain.    ACTIVITY LIMITATIONS: bending, standing, continence, and toileting  PARTICIPATION LIMITATIONS: interpersonal relationship and community activity  PERSONAL FACTORS: 3+ comorbidities:  fibromyalgia, IBS, GERD, migraine, back surgery L4/5, bladder surgery 1995  are also affecting patient's functional outcome.   REHAB POTENTIAL: Excellent  CLINICAL DECISION MAKING: Evolving/moderate complexity  EVALUATION COMPLEXITY: Moderate   GOALS: Goals reviewed with patient? Yes  SHORT TERM GOALS: Target date: 04/03/23  Ind with intial HEP Baseline: Goal status: INITIAL  2.  Ind with moisturizing  Baseline:  Goal status: INITIAL    LONG TERM GOALS: Target date: 05/29/23  Pt will be independent with advanced HEP to maintain improvements made throughout therapy  Baseline:  Goal status: INITIAL  2.  Pt will have to use 1 pads per day  Baseline: 2-4 Goal status: INITIAL  3.  Pt will be able to functional actions such as walking to the bathroom without leakage  Baseline:  Goal status: INITIAL  4.  Pt will report no fecal leakage noted due to improved pelvic floor strength and sensation. Baseline:  Goal status: INITIAL  5.  Pt will have improved sensation of bowel and bladder fullness before it becomes urgent due to improved muscle tone and increased muscle length Baseline:  Goal status: INITIAL   PLAN:  PT FREQUENCY: 1-2x/week  PT DURATION:  12 weeks  PLANNED INTERVENTIONS: Therapeutic exercises, Therapeutic activity, Neuromuscular re-education, Balance training, Gait training, Patient/Family education, Self Care, Joint mobilization, Dry Needling, Electrical stimulation, Cryotherapy, Moist heat, Taping, Biofeedback, Manual therapy, and Re-evaluation  PLAN FOR NEXT SESSION: discuss release wand and toilieting techniques, intenral STM and work on exhale with kegel, RUSI pelvic floor   [employer], PT 03/06/2023, 6:48 PM

## 2023-03-17 DIAGNOSIS — S0083XA Contusion of other part of head, initial encounter: Secondary | ICD-10-CM | POA: Diagnosis not present

## 2023-03-17 DIAGNOSIS — S20312A Abrasion of left front wall of thorax, initial encounter: Secondary | ICD-10-CM | POA: Diagnosis not present

## 2023-03-17 DIAGNOSIS — R0782 Intercostal pain: Secondary | ICD-10-CM | POA: Diagnosis not present

## 2023-04-04 ENCOUNTER — Ambulatory Visit: Payer: HMO | Attending: Obstetrics and Gynecology | Admitting: Physical Therapy

## 2023-04-04 ENCOUNTER — Encounter: Payer: Self-pay | Admitting: Physical Therapy

## 2023-04-04 DIAGNOSIS — M6281 Muscle weakness (generalized): Secondary | ICD-10-CM | POA: Diagnosis not present

## 2023-04-04 DIAGNOSIS — M62838 Other muscle spasm: Secondary | ICD-10-CM | POA: Insufficient documentation

## 2023-04-04 DIAGNOSIS — R279 Unspecified lack of coordination: Secondary | ICD-10-CM | POA: Insufficient documentation

## 2023-04-04 NOTE — Therapy (Addendum)
OUTPATIENT PHYSICAL THERAPY FEMALE PELVIC Treatment   Patient Name: Heather Ramsey MRN: [id number] DOB:[birth year], 77 y.o., female Today's Date: 04/04/2023  END OF SESSION:  PT End of Session - 04/04/23 1446     Visit Number 2    Date for PT Re-Evaluation 05/29/23    Authorization Type [employer]    PT Start Time 1446    PT Stop Time 1535    PT Time Calculation (min) 49 min    Activity Tolerance Patient tolerated treatment well    Behavior During Therapy WFL for tasks assessed/performed              Past Medical History:  Diagnosis Date   Atrophic vaginitis    Diverticulosis of colon 2005& 2010   FH colon cancer   Fibromyalgia    Dr Corliss Skains   GERD (gastroesophageal reflux disease)    Hemorrhoid    Hyperlipidemia    Hypertension    IBS (irritable bowel syndrome)    Migraine    MVP (mitral valve prolapse)    Osteopenia    Pulmonary nodule 07/2006   Past Surgical History:  Procedure Laterality Date   back surgy  02/26/2017   lumbar 4-5   BLADDER SURGERY  1995   BREAST BIOPSY Left 2003   [facility] PROCEDURE  1995   with bladder tack    CATARACT EXTRACTION, BILATERAL  05/2019   COLONOSCOPY  07/2014   negative; Dr Juanda Chance   FOOT SURGERY Left 2014   Patient Active Problem List   Diagnosis Date Noted   Herpes 10/14/2022   Aortic root dilatation (HCC) 10/30/2021   Thoracic aortic aneurysm, mild 10/10/2021   Nausea and vomiting 10/10/2021   RAD (reactive airway disease) 12/29/2019   Cough 03/17/2018   Cardiac murmur - aortic valve thickening 01/07/2018   Osteoporosis 08/18/2017   Hyperglycemia 07/30/2017   Venous stasis dermatitis of both lower extremities 04/09/2017   Spondylolisthesis at L4-L5 level 02/26/2017   Fibromyalgia 01/03/2017   Primary osteoarthritis of both hands 01/03/2017   Trochanteric bursitis of both hips 01/03/2017   Hypertension 12/29/2016   Spinal stenosis of lumbar region with neurogenic claudication 09/04/2016   Lumbar  radiculopathy 09/04/2016   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   Abnormal EKG 02/05/2012   History of basal cell carcinoma 04/24/2011   DIVERTICULOSIS, COLON 06/09/2009   Hyperlipidemia 05/31/2008   IRRITABLE BOWEL SYNDROME 05/27/2008   Chronic fatigue syndrome 05/27/2008    PCP: Pincus Sanes, MD   REFERRING PROVIDER: Marguerita Beards, MD   REFERRING DIAG: N39.41 (ICD-10-CM) - Urge incontinenceN39.3 (ICD-10-CM) - SUI (stress urinary incontinence, female)R15.9 (ICD-10-CM) - Incontinence of feces, unspecified fecal incontinence typeR33.9 (ICD-10-CM) - Incomplete bladder emptying  THERAPY DIAG:  Other muscle spasm  Unspecified lack of coordination  Muscle weakness (generalized)  Rationale for Evaluation and Treatment: Rehabilitation  ONSET DATE: over time  SUBJECTIVE:  SUBJECTIVE STATEMENT: Had fecal incontinence one day when getting back from a walk.  Fluid intake: Yes: a lot of water    PAIN:  Are you having pain? No   PRECAUTIONS: None  WEIGHT BEARING RESTRICTIONS: No  FALLS:  Has patient fallen in last 6 months? No  LIVING ENVIRONMENT: Lives with: lives with their spouse Lives in: House/apartment   OCCUPATION: no, walking and exercise  PLOF: Independent  PATIENT GOALS: control leakage  PERTINENT HISTORY:  PMH: fibromyalgia, IBS, GERD, migraine, back surgery L4/5, bladder surgery 1995 Sexual abuse: No(later states when very young)  BOWEL MOVEMENT: Pain with bowel movement: No Type of bowel movement:Type (Bristol Stool Scale) loose to small and hard, Frequency sometimes a lot in one day, sometimes every other day, and Strain Yes sometimes with constipation has IBS Fully empty rectum: No sometimes no Leakage: Yes: not much every other day Pads: Yes:    Fiber supplement: No  URINATION: Pain with urination: No Fully empty bladder: No Stream: Strong (have to lean forward Urgency: Yes:   Frequency: nocturia 3x, daytime 6-8x Leakage: Walking to the bathroom, Coughing, Sneezing, and damp Pads: Yes: 2-4 long pads  INTERCOURSE: Pain with intercourse: not currently  PREGNANCY: Vaginal deliveries 2 PROLAPSE: Cystocele - no sure   OBJECTIVE:   DIAGNOSTIC FINDINGS:    PATIENT SURVEYS:    PFIQ-7   COGNITION: Overall cognitive status: Within functional limits for tasks assessed     SENSATION: Light touch:  Proprioception:   MUSCLE LENGTH: Hamstrings: Right ~70 deg; Left ~80 deg   LUMBAR SPECIAL TESTS:    FUNCTIONAL TESTS:  Single leg Left trendelenburg  GAIT:  Comments: stiff and decreased trunk rotation  POSTURE: rounded shoulders, increased lumbar lordosis, increased thoracic kyphosis, and anterior pelvic tilt  PELVIC ALIGNMENT:  LUMBARAROM/PROM:  A/PROM A/PROM  eval  Flexion 75%  Extension   Right lateral flexion   Left lateral flexion   Right rotation   Left rotation    (Blank rows = not tested)  LOWER EXTREMITY ROM:  Passive ROM Right eval Left eval  Hip flexion [facility]   Hip extension    Hip abduction    Hip adduction    Hip internal rotation 75% WFL   Hip external rotation 75% 80%  Knee flexion    Knee extension    Ankle dorsiflexion    Ankle plantarflexion    Ankle inversion    Ankle eversion     (Blank rows = not tested)  LOWER EXTREMITY MMT:  MMT Right eval Left eval  Hip flexion    Hip extension    Hip abduction 4/5 4-/5  Hip adduction 5/5 4/5  Hip internal rotation    Hip external rotation    Knee flexion    Knee extension    Ankle dorsiflexion    Ankle plantarflexion    Ankle inversion    Ankle eversion     PALPATION:   General  lumbar and thoracic paraspinals tight                External Perineal Exam posterior prolapse noted to introitus at rest in  supine, slightly more pale color, dryness                             Internal Pelvic Floor ileococcygeus and obturator internus tight bil Rt more than Lt  Patient confirms identification and approves PT to assess internal pelvic floor and treatment Yes  PELVIC MMT:   MMT eval  Vaginal 3/5 (after doing release of tension) - 1 rep x 1 sec  Internal Anal Sphincter   External Anal Sphincter   Puborectalis   Diastasis Recti   (Blank rows = not tested)        TONE: High posterior  PROLAPSE: Posterior wall  TODAY'S TREATMENT:                                                                                                                              DATE: 04/04/23  NMRE/Exercise:  Henreitta Leber with ball squeeze Kegel with ball and yoga block LE internal and external rotation Breathing and transversus abdominus activation with ball squeeze to activate pelvic floor Rollout pball Knee to chest Hip rotation Piriformis stretch  Theract Toileting techniques  Manual: Patient confirms identification and approves physical therapist to perform internal soft tissue work   - pelvic floor soft tissue done in supine vaginally to obturators, ileococcygeus, and levators bilaterally   03/06/23  EVAL and initial HEP of diaphragmatic breathing and moisturizer demo and samples given   PATIENT EDUCATION:  Education details: Access Code: Editor, commissioning Person educated: Patient Education method: Explanation, Facilities manager, and Handouts Education comprehension: verbalized understanding  HOME EXERCISE PROGRAM: Access Code: [email or web address] URL: [email or web address] Date: 04/04/2023 Prepared by: Dwana Curd  Exercises - Supine Piriformis Stretch with Foot on Ground  - 1 x daily - 7 x weekly - 1 sets - 3 reps - 30 sec hold - Supine Figure 4 Piriformis Stretch  - 1 x daily - 7 x weekly - 1 sets - 3 reps - 30 sec hold - Supported Teacher, music with Pelvic Floor Relaxation  - 1 x  daily - 7 x weekly - 3 sets - 10 reps - Supine Hip Internal and External Rotation  - 1 x daily - 7 x weekly - 1 sets - 10 reps - 5 sec hold - Hooklying Single Knee to Chest Stretch  - 1 x daily - 7 x weekly - 1 sets - 5 reps - 20 sec hold - Supine Bridge with Mini Swiss Ball Between Knees  - 1 x daily - 7 x weekly - 3 sets - 10 reps  ASSESSMENT:  CLINICAL IMPRESSION: Pt at initial f/u from eval.  Pt did well with focus on core.  They are having a hard time finding the pelvic floor muscles but with co-activation of transversus abdominus and adductors is getting some lift of pelvic floor.  Pt also did better after release to pelvic floor muscle tension with soft tissue work.  Pt was given toileting techniques and stretches to continue to address impairments at home.  Pt will benefit from skilled PT to address these impairments and improve muscle tone and coordination of pelvic floor with diaphragm and core during functional activities as well as addressing pain management.  OBJECTIVE IMPAIRMENTS: decreased coordination, decreased endurance, decreased ROM, decreased strength, increased muscle spasms, impaired flexibility, impaired tone, postural  dysfunction, and pain.   ACTIVITY LIMITATIONS: bending, standing, continence, and toileting  PARTICIPATION LIMITATIONS: interpersonal relationship and community activity  PERSONAL FACTORS: 3+ comorbidities:  fibromyalgia, IBS, GERD, migraine, back surgery L4/5, bladder surgery 1995  are also affecting patient's functional outcome.   REHAB POTENTIAL: Excellent  CLINICAL DECISION MAKING: Evolving/moderate complexity  EVALUATION COMPLEXITY: Moderate   GOALS: Goals reviewed with patient? Yes  SHORT TERM GOALS: Target date: 04/03/23  Ind with intial HEP Baseline: Goal status: IN PROGRESS  2.  Ind with moisturizing  Baseline:  Goal status: MET    LONG TERM GOALS: Target date: 05/29/23  Pt will be independent with advanced HEP to maintain  improvements made throughout therapy  Baseline:  Goal status: INITIAL  2.  Pt will have to use 1 pads per day  Baseline: 2-4 Goal status: INITIAL  3.  Pt will be able to functional actions such as walking to the bathroom without leakage  Baseline:  Goal status: INITIAL  4.  Pt will report no fecal leakage noted due to improved pelvic floor strength and sensation. Baseline:  Goal status: INITIAL  5.  Pt will have improved sensation of bowel and bladder fullness before it becomes urgent due to improved muscle tone and increased muscle length Baseline:  Goal status: INITIAL   PLAN:  PT FREQUENCY: 1-2x/week  PT DURATION: 12 weeks  PLANNED INTERVENTIONS: Therapeutic exercises, Therapeutic activity, Neuromuscular re-education, Balance training, Gait training, Patient/Family education, Self Care, Joint mobilization, Dry Needling, Electrical stimulation, Cryotherapy, Moist heat, Taping, Biofeedback, Manual therapy, and Re-evaluation  PLAN FOR NEXT SESSION: discuss pelvic release wand and toilieting techniques, exhale with kegel, maybe try RUSI pelvic floor feedback   Brayton Caves Dax Murguia, PT 04/04/2023, 4:08 PM  PHYSICAL THERAPY DISCHARGE SUMMARY  Visits from Start of Care: 2  Current functional level related to goals / functional outcomes: See above goals, one follow up only   Remaining deficits: See above   Education / Equipment: HEP   Patient agrees to discharge. Patient goals were not met. Patient is being discharged due to  pt wanted to stop due to had a fall and fractured ribs, needing to heal from that first. Russella Dar, PT, DPT 04/08/23 9:26 AM

## 2023-04-11 ENCOUNTER — Ambulatory Visit: Payer: HMO | Admitting: Physical Therapy

## 2023-04-17 ENCOUNTER — Other Ambulatory Visit: Payer: Self-pay | Admitting: Internal Medicine

## 2023-04-18 ENCOUNTER — Encounter: Payer: HMO | Admitting: Physical Therapy

## 2023-04-23 ENCOUNTER — Encounter: Payer: HMO | Admitting: Physical Therapy

## 2023-04-25 ENCOUNTER — Encounter: Payer: Self-pay | Admitting: Physical Therapy

## 2023-04-26 ENCOUNTER — Encounter: Payer: Self-pay | Admitting: Obstetrics and Gynecology

## 2023-04-26 ENCOUNTER — Ambulatory Visit (INDEPENDENT_AMBULATORY_CARE_PROVIDER_SITE_OTHER): Payer: HMO | Admitting: Obstetrics and Gynecology

## 2023-04-26 VITALS — BP 125/81 | HR 62

## 2023-04-26 DIAGNOSIS — N3941 Urge incontinence: Secondary | ICD-10-CM

## 2023-04-26 DIAGNOSIS — R339 Retention of urine, unspecified: Secondary | ICD-10-CM | POA: Diagnosis not present

## 2023-04-26 DIAGNOSIS — N393 Stress incontinence (female) (male): Secondary | ICD-10-CM

## 2023-04-26 DIAGNOSIS — R159 Full incontinence of feces: Secondary | ICD-10-CM | POA: Diagnosis not present

## 2023-04-26 NOTE — Progress Notes (Signed)
[facility] Return Visit  SUBJECTIVE  History of Present Illness: Heather Ramsey is a 77 y.o. female seen in follow-up for urinary incontinence and incomplete bladder emptying.   Urodynamic Impression:  1. Sensation was reduced; capacity was increased 2. Stress Incontinence was demonstrated at normal pressures; 3. Detrusor Overactivity was demonstrated without leakage. 4. On initial Uroflow, had PVR of . Emptying was dysfunctional with a normal PVR (150), a sustained detrusor contraction present,  abdominal straining present, normal urethral sphincter activity on EMG.   She started pelvic PT for bladder and bowel leakage but had a fall and had to stop due to rib fractures.   Past Medical History: Patient  has a past medical history of Atrophic vaginitis, Diverticulosis of colon (2005& 2010), Fibromyalgia, GERD (gastroesophageal reflux disease), Hemorrhoid, Hyperlipidemia, Hypertension, IBS (irritable bowel syndrome), Migraine, MVP (mitral valve prolapse), Osteopenia, and Pulmonary nodule (07/2006).   Past Surgical History: She  has a past surgical history that includes Breast biopsy (Left, 2003); Burch procedure (1995); Colonoscopy (07/2014); Foot surgery (Left, 2014); back surgy (02/26/2017); Cataract extraction, bilateral (05/2019); and Bladder surgery (1995).   Medications: She has a current medication list which includes the following prescription(s): amlodipine, calcium citrate-vitamin d, vasculera, losartan, magnesium, misc natural products, metamucil, and valacyclovir.   Allergies: Patient is allergic to clarithromycin, clindamycin, levofloxacin, metronidazole, tramadol, actonel [risedronate sodium], atorvastatin, bactroban [mupirocin], bactroban [mupirocin calcium], and risedronate sodium.   Social History: Patient  reports that she has never smoked. She has never used smokeless tobacco. She reports that she does not drink alcohol and does not use drugs.       OBJECTIVE     Physical Exam: Vitals:   04/26/23 1044  BP: 125/81  Pulse: 62   Gen: No apparent distress, A&O x 3.  Detailed Urogynecologic Evaluation:  Deferred. Prior exam showed:  POP-Q   -2                                            Aa   -2                                           Ba   -7                                              C    3.5                                            Gh   4                                            Pb   7.5  tvl    0                                            Ap   0                                            Bp   -6.5                                                 ASSESSMENT AND PLAN    Heather Ramsey is a 77 y.o. with:  1. Incomplete bladder emptying   2. Urge incontinence   3. SUI (stress urinary incontinence, female)   4. Incontinence of feces, unspecified fecal incontinence type    - Reviewed results of urodynamic testing. Has dysfunctional voiding which causes intermittent incomplete emptying. She had been attending physical therapy but had to stop due to fall and rib fractures. We discussed starting up again once she has improved.  - We discussed that we cannot treat the SUI with a procedure due to incomplete emtpying, as this may exacerbate symptoms. Recommended trial of sacral nerve stimulation prior to treatment for SUI. We discussed that SNM can improve bladder emptying, urgency and incontinence and bowel leakage. She is not sure she wants to proceed with the trial. Handouts provided on [employer] Interstim.  - She will notify us if she wants to proceed with SNM or PT.  Marguerita Beards, MD  Time spent: I spent 30 minutes dedicated to the care of this patient on the date of this encounter to include pre-visit review of records, face-to-face time with the patient and post visit documentation.

## 2023-05-24 ENCOUNTER — Encounter: Payer: Self-pay | Admitting: Internal Medicine

## 2023-05-30 DIAGNOSIS — R059 Cough, unspecified: Secondary | ICD-10-CM | POA: Diagnosis not present

## 2023-05-30 DIAGNOSIS — R0981 Nasal congestion: Secondary | ICD-10-CM | POA: Diagnosis not present

## 2023-06-18 DIAGNOSIS — H52203 Unspecified astigmatism, bilateral: Secondary | ICD-10-CM | POA: Diagnosis not present

## 2023-06-18 DIAGNOSIS — H0100B Unspecified blepharitis left eye, upper and lower eyelids: Secondary | ICD-10-CM | POA: Diagnosis not present

## 2023-06-18 DIAGNOSIS — H04123 Dry eye syndrome of bilateral lacrimal glands: Secondary | ICD-10-CM | POA: Diagnosis not present

## 2023-06-18 DIAGNOSIS — H43813 Vitreous degeneration, bilateral: Secondary | ICD-10-CM | POA: Diagnosis not present

## 2023-06-18 DIAGNOSIS — H0100A Unspecified blepharitis right eye, upper and lower eyelids: Secondary | ICD-10-CM | POA: Diagnosis not present

## 2023-06-18 DIAGNOSIS — Z961 Presence of intraocular lens: Secondary | ICD-10-CM | POA: Diagnosis not present

## 2023-06-18 DIAGNOSIS — H524 Presbyopia: Secondary | ICD-10-CM | POA: Diagnosis not present

## 2023-06-28 ENCOUNTER — Encounter: Payer: Self-pay | Admitting: Internal Medicine

## 2023-07-02 ENCOUNTER — Encounter: Payer: Self-pay | Admitting: Internal Medicine

## 2023-07-02 DIAGNOSIS — L659 Nonscarring hair loss, unspecified: Secondary | ICD-10-CM | POA: Insufficient documentation

## 2023-07-02 NOTE — Progress Notes (Unsigned)
    Subjective:    Patient ID: Heather Ramsey, female    DOB: [birth year], 77 y.o.   MRN: [id number]      HPI Heather Ramsey is here for No chief complaint on file.    Hair falling out     Medications and allergies reviewed with patient and updated if appropriate.  Current Outpatient Medications on File Prior to Visit  Medication Sig Dispense Refill   amLODipine (NORVASC) 2.5 MG tablet Take 1 tablet (2.5 mg total) by mouth daily. 90 tablet 3   Calcium Carbonate-Vitamin D (CALCIUM + D PO) Take 1 tablet by mouth 2 (two) times daily.      Dietary Management Product (VASCULERA) TABS Take 1 tablet by mouth daily. 90 tablet 3   losartan (COZAAR) 50 MG tablet Take 1 tablet by mouth once daily 100 tablet 1   magnesium 30 MG tablet Take 30 mg by mouth 2 (two) times daily.     Misc Natural Products (TART CHERRY ADVANCED PO) Take by mouth daily.     Psyllium (METAMUCIL) 28.3 % POWD Take 1 scoop dissolved in at least 8 ounces water/juice and drink daily     valACYclovir (VALTREX) 1000 MG tablet Take 2 tabs Q 12 hrs x 1 day for outbreak 12 tablet 5   No current facility-administered medications on file prior to visit.    Review of Systems     Objective:  There were no vitals filed for this visit. BP Readings from Last 3 Encounters:  04/26/23 125/81  02/13/23 129/79  01/16/23 133/84   Wt Readings from Last 3 Encounters:  01/16/23 135 lb (61.2 kg)  12/07/22 132 lb 12.8 oz (60.2 kg)  10/16/22 137 lb (62.1 kg)   There is no height or weight on file to calculate BMI.    Physical Exam         Assessment & Plan:    See Problem List for Assessment and Plan of chronic medical problems.

## 2023-07-02 NOTE — Patient Instructions (Incomplete)
      Blood work was ordered.   The lab is on the first floor.    Medications changes include :   you can try taking a hair skin and nails vitamin.     See dermatology

## 2023-07-03 ENCOUNTER — Ambulatory Visit (INDEPENDENT_AMBULATORY_CARE_PROVIDER_SITE_OTHER): Payer: HMO | Admitting: Internal Medicine

## 2023-07-03 VITALS — BP 128/80 | HR 73 | Temp 98.0°F | Ht 59.0 in | Wt 139.0 lb

## 2023-07-03 DIAGNOSIS — M810 Age-related osteoporosis without current pathological fracture: Secondary | ICD-10-CM | POA: Diagnosis not present

## 2023-07-03 DIAGNOSIS — I10 Essential (primary) hypertension: Secondary | ICD-10-CM

## 2023-07-03 DIAGNOSIS — L659 Nonscarring hair loss, unspecified: Secondary | ICD-10-CM

## 2023-07-03 LAB — COMPREHENSIVE METABOLIC PANEL
ALT: 16 U/L (ref 0–35)
AST: 18 U/L (ref 0–37)
Albumin: 4.1 g/dL (ref 3.5–5.2)
Alkaline Phosphatase: 68 U/L (ref 39–117)
BUN: 13 mg/dL (ref 6–23)
CO2: 28 meq/L (ref 19–32)
Calcium: 9.5 mg/dL (ref 8.4–10.5)
Chloride: 101 meq/L (ref 96–112)
Creatinine, Ser: 0.84 mg/dL (ref 0.40–1.20)
GFR: 67.34 mL/min (ref >60.00)
Glucose, Bld: 201 mg/dL — ABNORMAL HIGH (ref 70–99)
Potassium: 3.6 meq/L (ref 3.5–5.1)
Sodium: 137 meq/L (ref 135–145)
Total Bilirubin: 0.3 mg/dL (ref 0.2–1.2)
Total Protein: 6.3 g/dL (ref 6.0–8.3)

## 2023-07-03 LAB — IBC PANEL
Iron: 52 ug/dL (ref 42–145)
Saturation Ratios: 16.7 % — ABNORMAL LOW (ref 20.0–50.0)
TIBC: 312.2 ug/dL (ref 250.0–450.0)
Transferrin: 223 mg/dL (ref 212.0–360.0)

## 2023-07-03 NOTE — Assessment & Plan Note (Signed)
Chronic Blood pressure well controlled CMP Continue amlodipine 2.5 mg daily, losartan 50 mg daily

## 2023-07-03 NOTE — Assessment & Plan Note (Signed)
Chronic Not taking calcium and vitamin d daily -- advised to take regularly Will check vitamin d level

## 2023-07-03 NOTE — Assessment & Plan Note (Signed)
Acute Started months ago - no obvious cause No covid preceding this, no URI, stress, change in medications or change in diet Check cbc, cmp, tsh, B12, vitamin d level, iron panel ? Hair follicle issue  Will see her dermatologist

## 2023-07-04 LAB — CBC WITH DIFFERENTIAL/PLATELET
Basophils Absolute: 0.1 10*3/uL (ref 0.0–0.1)
Basophils Relative: 1.2 % (ref 0.0–3.0)
Eosinophils Absolute: 0.1 10*3/uL (ref 0.0–0.7)
Eosinophils Relative: 1.5 % (ref 0.0–5.0)
HCT: 36.2 % (ref 36.0–46.0)
Hemoglobin: 12.1 g/dL (ref 12.0–15.0)
Lymphocytes Relative: 20.9 % (ref 12.0–46.0)
Lymphs Abs: 1.4 10*3/uL (ref 0.7–4.0)
MCHC: 33.4 g/dL (ref 30.0–36.0)
MCV: 91.2 fL (ref 78.0–100.0)
Monocytes Absolute: 0.4 10*3/uL (ref 0.1–1.0)
Monocytes Relative: 5.4 % (ref 3.0–12.0)
Neutro Abs: 4.9 10*3/uL (ref 1.4–7.7)
Neutrophils Relative %: 71 % (ref 43.0–77.0)
Platelets: 246 10*3/uL (ref 150.0–400.0)
RBC: 3.97 Mil/uL (ref 3.87–5.11)
RDW: 13.5 % (ref 11.5–15.5)
WBC: 6.9 10*3/uL (ref 4.0–10.5)

## 2023-07-04 LAB — VITAMIN D 25 HYDROXY (VIT D DEFICIENCY, FRACTURES): VITD: 25.78 ng/mL — ABNORMAL LOW (ref 30.00–100.00)

## 2023-07-04 LAB — TSH: TSH: 2.26 u[IU]/mL (ref 0.35–5.50)

## 2023-07-04 LAB — VITAMIN B12: Vitamin B-12: 447 pg/mL (ref 211–911)

## 2023-07-04 LAB — FERRITIN: Ferritin: 78.8 ng/mL (ref 10.0–291.0)

## 2023-07-15 ENCOUNTER — Ambulatory Visit (INDEPENDENT_AMBULATORY_CARE_PROVIDER_SITE_OTHER): Payer: HMO

## 2023-07-15 VITALS — Ht 59.0 in | Wt 139.0 lb

## 2023-07-15 DIAGNOSIS — Z0000 Encounter for general adult medical examination without abnormal findings: Secondary | ICD-10-CM
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
## 2023-07-15 NOTE — Patient Instructions (Signed)
Heather Ramsey , Thank you for taking time to come for your Medicare Wellness Visit. I appreciate your ongoing commitment to your health goals. Please review the following plan we discussed and let me know if I can assist you in the future.   Referrals/Orders/Follow-Ups/Clinician Recommendations: It was nice talking with you today.  Keep up the good work.  This is a list of the screening recommended for you and due dates:  Health Maintenance  Topic Date Due   DTaP/Tdap/Td vaccine (2 - Tdap) 12/31/2017   COVID-19 Vaccine (3 - Moderna risk series) 07/31/2023*   DEXA scan (bone density measurement)  10/17/2023*   Medicare Annual Wellness Visit  07/14/2024   Colon Cancer Screening  02/17/2025   Pneumonia Vaccine  Completed   Hepatitis C Screening  Completed   Zoster (Shingles) Vaccine  Completed   HPV Vaccine  Aged Out   Flu Shot  Discontinued  *Topic was postponed. The date shown is not the original due date.    Advanced directives: (In Chart) A copy of your advanced directives are scanned into your chart should your provider ever need it.  Next Medicare Annual Wellness Visit scheduled for next year: Yes

## 2023-07-17 DIAGNOSIS — B078 Other viral warts: Secondary | ICD-10-CM | POA: Diagnosis not present

## 2023-07-17 DIAGNOSIS — L821 Other seborrheic keratosis: Secondary | ICD-10-CM | POA: Diagnosis not present

## 2023-07-17 DIAGNOSIS — Z85828 Personal history of other malignant neoplasm of skin: Secondary | ICD-10-CM | POA: Diagnosis not present

## 2023-09-10 ENCOUNTER — Other Ambulatory Visit: Payer: Self-pay | Admitting: Internal Medicine

## 2023-10-16 ENCOUNTER — Ambulatory Visit: Payer: HMO | Admitting: Internal Medicine

## 2023-10-16 ENCOUNTER — Encounter: Payer: Self-pay | Admitting: Internal Medicine

## 2023-10-16 VITALS — BP 133/86 | HR 105 | Ht 60.0 in | Wt 138.0 lb

## 2023-10-16 DIAGNOSIS — R058 Other specified cough: Secondary | ICD-10-CM | POA: Diagnosis not present

## 2023-10-16 MED ORDER — BENZONATATE 200 MG PO CAPS: 200.0000 mg | ORAL_CAPSULE | Freq: Three times a day (TID) | ORAL | 1 refills | Status: AC | PRN

## 2023-10-16 NOTE — Assessment & Plan Note (Addendum)
Recurrent pattern since her 30's typically worse in fal - triggered  Oct 2024 likely by COVID 19  - 10/16/2023 cyclical cough rx with delsym supplemented with tessalon and max rx for GERD   Upper airway cough syndrome (previously labeled PNDS),  is so named because it's frequently impossible to sort out how much is  CR/sinusitis with freq throat clearing (which can be related to primary GERD)   vs  causing  secondary (" extra esophageal")  GERD from wide swings in gastric pressure that occur with throat clearing, often  promoting self use of mint and menthol lozenges that reduce the lower esophageal sphincter tone and exacerbate the problem further in a cyclical fashion.   These are the same pts (now being labeled as having "irritable larynx syndrome" by some cough centers) who not infrequently have a history of having failed to tolerate ace inhibitors,  dry powder inhalers or biphosphonates or report having atypical/extraesophageal reflux symptoms (LPR)  that don't respond to standard doses of PPI  and are easily confused as having aecopd or asthma flares by even experienced allergists/ pulmonologists (myself included).   Of the three most common causes of  Sub-acute / recurrent or chronic cough, only one (GERD)  can actually contribute to/ trigger  the other two (asthma and post nasal drip syndrome)  and perpetuate the cylce of cough.  While not intuitively obvious, many patients with chronic low grade reflux do not cough until there is a primary insult that disturbs the protective epithelial barrier and exposes sensitive nerve endings.  This is typically viral but can due to PNDS and  either may apply here.     >>> The point is that once this occurs, it is difficult to eliminate the cycle  using anything but a maximally effective acid suppression regimen at least in the short run, accompanied by an appropriate diet to address non acid GERD and control / eliminate the cough itself for at least 3 days  wit delsym/ tessalon and add hs 1st gen H1 blockers per guidelines.  All of these steps can be repeated at the very onset of the cough in the fall s a phone call except for the tessalon 200 rx.  F/u in 2 weeks if not responding to above      Each maintenance medication was reviewed in detail including emphasizing most importantly the difference between maintenance and prns and under what circumstances the prns are to be triggered using an action plan format where appropriate.   Total time for H and P, chart review, counseling,   and generating customized AVS unique to this office visit / same day charting  > 30 min with new pt  for  recurrent  refractory respiratory  symptoms of uncertain etiology        .

## 2023-10-16 NOTE — Patient Instructions (Addendum)
Try prilosec otc 20mg   Take 30-60 min before first meal of the day and Pepcid ac (famotidine) 20 mg one @  bedtime until cough is completely gone for at least a week without the need for cough suppression     GERD (REFLUX)  is an extremely common cause of respiratory symptoms just like yours , many times with no obvious heartburn at all.    It can be treated with medication, but also with lifestyle changes including elevation of the head of your bed (ideally with 6 -8inch blocks under the headboard of your bed),  Smoking cessation, avoidance of late meals, excessive alcohol, and avoid fatty foods, chocolate, peppermint, colas, red wine, and acidic juices such as orange juice.  NO MINT OR MENTHOL PRODUCTS SO NO COUGH DROPS  USE SUGARLESS CANDY INSTEAD (Jolley ranchers or Stover's or Life Savers) or even ice chips will also do - the key is to swallow to prevent all throat clearing. NO OIL BASED VITAMINS - use powdered substitutes.  Avoid fish oil when coughing.     Delsym 2 tsp every 12 hours and supplement with the tessalon pearls 200 mg every 6-8 hours   For drainage / throat tickle try take CHLORPHENIRAMINE  4 mg  ("Allergy Relief" 4mg   at [facility] should be easiest to find in the blue box usually on bottom shelf)  take one every 4 hours as needed - extremely effective and inexpensive over the counter- may cause drowsiness so start with just a dose or two an hour before bedtime and see how you tolerate it before trying in daytime.   Call if not completely better in 2 weeks

## 2023-10-16 NOTE — Progress Notes (Signed)
Heather Ramsey, female    DOB: Jan 25, 1946   MRN: [id number]   Brief patient profile:  28  yowf never smoker with recurrent cough since she was in her 30s >>> typically late fall early winter referred to pulmonary clinic 10/16/2023 by Heather Cockayne  MD for cough x late oct 2024 w/in a week of covid dx which she did not specifically get treated, but then did go to UC not in epic for abx and cough syrup helped some        History of Present Illness  10/16/2023  Pulmonary/ 1st office eval/Heather Ramsey  Chief Complaint  Patient presents with   Consult    Pt states she has had a ongoing dry cough for years it would come and go. This year it has been worse.   Dyspnea:  more sob when coughing but otherwise well preserved activityh tol   Cough: worse when lie   L side down > supine  esp when does back ex even on empty stomach she cough always dry/ also happens hs with sensation of PNDS/ freq urination Sleep: on back flat bed one pillow  SABA use: none  02 WGN:FAOZ     No obvious day to day or daytime pattern/variability or assoc excess/ purulent sputum or mucus plugs or hemoptysis or cp or chest tightness, subjective wheeze or overt sinus or hb symptoms.    Also denies any obvious fluctuation of symptoms with weather or environmental changes or other aggravating or alleviating factors except as outlined above   No unusual exposure hx or h/o childhood pna/ asthma or knowledge of premature birth.  Current Allergies, Complete Past Medical History, Past Surgical History, Family History, and Social History were reviewed in [employer] record.  ROS  The following are not active complaints unless bolded Hoarseness, sore throat, dysphagia, dental problems, itching, sneezing,  nasal congestion or discharge of excess mucus or purulent secretions, ear ache,   fever, chills, sweats, unintended wt loss or wt gain, classically pleuritic or exertional cp,  orthopnea pnd or arm/hand swelling   or leg swelling, presyncope, palpitations, abdominal pain, anorexia, nausea, vomiting, diarrhea  or change in bowel habits or change in bladder habits, change in stools or change in urine, dysuria, hematuria,  rash, arthralgias, visual complaints, headache, numbness, weakness or ataxia or problems with walking or coordination,  change in mood or  memory.             Outpatient Medications Prior to Visit  Medication Sig Dispense Refill   amLODipine (NORVASC) 2.5 MG tablet Take 1 tablet by mouth once daily 90 tablet 0   Calcium Carbonate-Vitamin D (CALCIUM + D PO) Take 1 tablet by mouth 2 (two) times daily.      Dietary Management Product (VASCULERA) TABS Take 1 tablet by mouth daily. 90 tablet 3   losartan (COZAAR) 50 MG tablet Take 1 tablet by mouth once daily 100 tablet 1   Psyllium (METAMUCIL) 28.3 % POWD Take 1 scoop dissolved in at least 8 ounces water/juice and drink daily     valACYclovir (VALTREX) 1000 MG tablet Take 2 tabs Q 12 hrs x 1 day for outbreak 12 tablet 5   No facility-administered medications prior to visit.    Past Medical History:  Diagnosis Date   Atrophic vaginitis    Diverticulosis of colon 2005& 2010   FH colon cancer   Fibromyalgia    Dr Corliss Skains   GERD (gastroesophageal reflux disease)    Hemorrhoid  Hyperlipidemia    Hypertension    IBS (irritable bowel syndrome)    Migraine    MVP (mitral valve prolapse)    Osteopenia    Pulmonary nodule 07/2006      Objective:     BP 133/86 (BP Location: Left Arm, Patient Position: Sitting, Cuff Size: Normal)   Pulse (!) 105   Ht 5' (1.524 m)   Wt 138 lb (62.6 kg)   SpO2 96% Comment: room air  BMI 26.95 kg/m   SpO2: 96 % (room air)  Pleasant white healthy appearing amb  wf nad    HEENT : Oropharynx  clear      Nasal turbinates nl    NECK :  without  apparent JVD/ palpable Nodes/TM    LUNGS: no acc muscle use,  Nl contour chest which is clear to A and P bilaterally without cough on insp or exp  maneuvers   CV:  RRR  no s3 or murmur or increase in P2, and no edema   ABD:  soft and nontender   MS:  Gait nl   ext warm without deformities Or obvious joint restrictions  calf tenderness, cyanosis or clubbing    SKIN: warm and dry without lesions    NEURO:  alert, approp, nl sensorium with  no motor or cerebellar deficits apparent.    I personally reviewed images and agree with radiology impression as follows:   Chest CTa    11/27/22 Mediastinum/Nodes: No enlarged mediastinal, hilar, or axillary lymph nodes. Thyroid gland, trachea, and esophagus demonstrate no significant findings.   Lungs/Pleura: There is no evidence of pulmonary edema, consolidation, pneumothorax, nodule or pleural fluid.       Assessment   Upper airway cough syndrome Recurrent pattern since her 30's typically worse in fal - triggered  Oct 2024 likely by COVID 19  - 10/16/2023 cyclical cough rx with delsym supplemented with tessalon and max rx for GERD   Upper airway cough syndrome (previously labeled PNDS),  is so named because it's frequently impossible to sort out how much is  CR/sinusitis with freq throat clearing (which can be related to primary GERD)   vs  causing  secondary (" extra esophageal")  GERD from wide swings in gastric pressure that occur with throat clearing, often  promoting self use of mint and menthol lozenges that reduce the lower esophageal sphincter tone and exacerbate the problem further in a cyclical fashion.   These are the same pts (now being labeled as having "irritable larynx syndrome" by some cough centers) who not infrequently have a history of having failed to tolerate ace inhibitors,  dry powder inhalers or biphosphonates or report having atypical/extraesophageal reflux symptoms (LPR)  that don't respond to standard doses of PPI  and are easily confused as having aecopd or asthma flares by even experienced allergists/ pulmonologists (myself included).   Of the three most common  causes of  Sub-acute / recurrent or chronic cough, only one (GERD)  can actually contribute to/ trigger  the other two (asthma and post nasal drip syndrome)  and perpetuate the cylce of cough.  While not intuitively obvious, many patients with chronic low grade reflux do not cough until there is a primary insult that disturbs the protective epithelial barrier and exposes sensitive nerve endings.  This is typically viral but can due to PNDS and  either may apply here.     >>> The point is that once this occurs, it is difficult to eliminate the cycle  using  anything but a maximally effective acid suppression regimen at least in the short run, accompanied by an appropriate diet to address non acid GERD and control / eliminate the cough itself for at least 3 days wit delsym/ tessalon and add hs 1st gen H1 blockers per guidelines.  All of these steps can be repeated at the very onset of the cough in the fall s a phone call except for the tessalon 200 rx.  F/u in 2 weeks if not responding to above      Each maintenance medication was reviewed in detail including emphasizing most importantly the difference between maintenance and prns and under what circumstances the prns are to be triggered using an action plan format where appropriate.   Total time for H and P, chart review, counseling,   and generating customized AVS unique to this office visit / same day charting  > 30 min with new pt  for  recurrent  refractory respiratory  symptoms of uncertain etiology        .     Sandrea Hughs, MD 10/16/2023

## 2023-10-20 NOTE — Progress Notes (Unsigned)
Subjective:    Patient ID: Heather Ramsey, female    DOB: 1945-10-18, 78 y.o.   MRN: [id number]      HPI [name] is here for a Physical exam and her chronic medical problems.   Just saw Dr Sherene Sires for chronic cough-on Prilosec OTC, Pepcid OTC, Delsym, Tessalon Perles.  No concerns.   Medications and allergies reviewed with patient and updated if appropriate.  Current Outpatient Medications on File Prior to Visit  Medication Sig Dispense Refill   amLODipine (NORVASC) 2.5 MG tablet Take 1 tablet by mouth once daily 90 tablet 0   benzonatate (TESSALON) 200 MG capsule Take 1 capsule (200 mg total) by mouth 3 (three) times daily as needed for cough. 30 capsule 1   Calcium Carbonate-Vitamin D (CALCIUM + D PO) Take 1 tablet by mouth 2 (two) times daily.      Dietary Management Product (VASCULERA) TABS Take 1 tablet by mouth daily. 90 tablet 3   losartan (COZAAR) 50 MG tablet Take 1 tablet by mouth once daily 100 tablet 1   Psyllium (METAMUCIL) 28.3 % POWD Take 1 scoop dissolved in at least 8 ounces water/juice and drink daily     valACYclovir (VALTREX) 1000 MG tablet Take 2 tabs Q 12 hrs x 1 day for outbreak 12 tablet 5   No current facility-administered medications on file prior to visit.    Review of Systems  Constitutional:  Negative for fever.  Eyes:  Negative for visual disturbance.  Respiratory:  Positive for cough (dry - chronic). Negative for chest tightness, shortness of breath and wheezing.   Cardiovascular:  Positive for leg swelling (feet b/l - if on them alot). Negative for chest pain and palpitations.  Gastrointestinal:  Positive for constipation. Negative for abdominal pain, blood in stool, diarrhea and nausea.       No gerd  Genitourinary:  Negative for dysuria.       Stress, urge incontinence  Musculoskeletal:  Positive for arthralgias (hip pain, knees) and back pain.  Skin:  Negative for rash.  Neurological:  Positive for headaches (? related  - dull, mild).  Negative for light-headedness.  Psychiatric/Behavioral:  Negative for dysphoric mood. The patient is not nervous/anxious.        Objective:   Vitals:   10/22/23 0811  BP: 130/80  Pulse: 77  Temp: 98 F (36.7 C)  SpO2: 96%   Filed Weights   10/22/23 0811  Weight: 132 lb (59.9 kg)   Body mass index is 25.78 kg/m.  BP Readings from Last 3 Encounters:  10/22/23 130/80  10/16/23 133/86  07/03/23 128/80    Wt Readings from Last 3 Encounters:  10/22/23 132 lb (59.9 kg)  10/16/23 138 lb (62.6 kg)  07/15/23 139 lb (63 kg)       Physical Exam Constitutional: She appears well-developed and well-nourished. No distress.  HENT:  Head: Normocephalic and atraumatic.  Right Ear: External ear normal. Normal ear canal and TM Left Ear: External ear normal.  Normal ear canal and TM Mouth/Throat: Oropharynx is clear and moist.  Eyes: Conjunctivae normal.  Neck: Neck supple. No tracheal deviation present. No thyromegaly present.  No carotid bruit  Cardiovascular: Normal rate, regular rhythm and normal heart sounds.   No murmur heard.  No edema. Pulmonary/Chest: Effort normal and breath sounds normal. No respiratory distress. She has no wheezes. She has no rales.  Breast: deferred   Abdominal: Soft. She exhibits no distension. There is no tenderness.  Lymphadenopathy: She has  no cervical adenopathy.  Skin: Skin is warm and dry. She is not diaphoretic.  Psychiatric: She has a normal mood and affect. Her behavior is normal.     Lab Results  Component Value Date   WBC 6.9 07/03/2023   HGB 12.1 07/03/2023   HCT 36.2 07/03/2023   PLT 246.0 07/03/2023   GLUCOSE 201 (H) 07/03/2023   CHOL 235 (H) 10/16/2022   TRIG 90.0 10/16/2022   HDL 71.10 10/16/2022   LDLDIRECT 186.4 10/21/2013   LDLCALC 146 (H) 10/16/2022   ALT 16 07/03/2023   AST 18 07/03/2023   NA 137 07/03/2023   K 3.6 07/03/2023   CL 101 07/03/2023   CREATININE 0.84 07/03/2023   BUN 13 07/03/2023   CO2 28  07/03/2023   TSH 2.26 07/03/2023   HGBA1C 5.7 10/16/2022         Assessment & Plan:   Physical exam: Screening blood work  ordered Exercise  not regular - does back exercises daily, tries to get a lot of steps in daily -stressed regular exercise for her bones, muscle strength, balance, etc. Weight  is good Substance abuse  none   Reviewed recommended immunizations.   Health Maintenance  Topic Date Due   DTaP/Tdap/Td (2 - Tdap) 10/21/2024 (Originally 12/31/2017)   DEXA SCAN  10/21/2024 (Originally 08/03/2019)   Medicare Annual Wellness (AWV)  07/14/2024   Colonoscopy  02/17/2025   Pneumonia Vaccine 85+ Years old  Completed   INFLUENZA VACCINE  Completed   Hepatitis C Screening  Completed   Zoster Vaccines- Shingrix  Completed   HPV VACCINES  Aged Out   COVID-19 Vaccine  Discontinued      Deferred DEXA-does not want to take any medication    See Problem List for Assessment and Plan of chronic medical problems.

## 2023-10-20 NOTE — Patient Instructions (Addendum)

## 2023-10-22 ENCOUNTER — Encounter: Payer: Self-pay | Admitting: Internal Medicine

## 2023-10-22 ENCOUNTER — Ambulatory Visit (INDEPENDENT_AMBULATORY_CARE_PROVIDER_SITE_OTHER): Payer: HMO | Admitting: Internal Medicine

## 2023-10-22 VITALS — BP 130/80 | HR 77 | Temp 98.0°F | Ht 60.0 in | Wt 132.0 lb

## 2023-10-22 DIAGNOSIS — I10 Essential (primary) hypertension: Secondary | ICD-10-CM

## 2023-10-22 DIAGNOSIS — R739 Hyperglycemia, unspecified: Secondary | ICD-10-CM | POA: Diagnosis not present

## 2023-10-22 DIAGNOSIS — B009 Herpesviral infection, unspecified: Secondary | ICD-10-CM | POA: Diagnosis not present

## 2023-10-22 DIAGNOSIS — Z0000 Encounter for general adult medical examination without abnormal findings: Secondary | ICD-10-CM | POA: Diagnosis not present

## 2023-10-22 DIAGNOSIS — E7849 Other hyperlipidemia: Secondary | ICD-10-CM

## 2023-10-22 DIAGNOSIS — R053 Chronic cough: Secondary | ICD-10-CM

## 2023-10-22 DIAGNOSIS — M810 Age-related osteoporosis without current pathological fracture: Secondary | ICD-10-CM

## 2023-10-22 NOTE — Assessment & Plan Note (Signed)
Chronic Intermittent flares Continue Valtrex as needed

## 2023-10-22 NOTE — Assessment & Plan Note (Signed)
Chronic Regular exercise and healthy diet encouraged Check lipid panel, CMP, TSH CAC score 7 in 09/2020 Continue lifestyle control

## 2023-10-22 NOTE — Assessment & Plan Note (Signed)
Chronic Started after having bronchitis and has been constant since Saw Dr. Sherene Sires recently Taking OTC Prilosec, OTC Pepcid, Delsym every 12 hours and Tessalon Perles

## 2023-10-22 NOTE — Assessment & Plan Note (Signed)
Chronic Lab Results  Component Value Date   HGBA1C 5.7 10/16/2022   Check a1c Low sugar / carb diet Stressed regular exercise

## 2023-10-22 NOTE — Assessment & Plan Note (Addendum)
Chronic DEXA due-deferred-she knows she does not want to take any medication Continue calcium and vitamin D daily Will check vitamin d level Stressed regular exercise Discussed fall prevention Discussed risk of increased fracture and that she should consider medication

## 2023-10-22 NOTE — Assessment & Plan Note (Signed)
Chronic Blood pressure well controlled CMP, CBC Continue amlodipine 2.5 mg daily, losartan 50 mg daily

## 2023-10-28 ENCOUNTER — Other Ambulatory Visit: Payer: Self-pay | Admitting: *Deleted

## 2023-10-28 ENCOUNTER — Other Ambulatory Visit: Payer: Self-pay | Admitting: Internal Medicine

## 2023-10-28 DIAGNOSIS — I77810 Thoracic aortic ectasia: Secondary | ICD-10-CM

## 2023-11-01 ENCOUNTER — Other Ambulatory Visit (INDEPENDENT_AMBULATORY_CARE_PROVIDER_SITE_OTHER): Payer: HMO

## 2023-11-01 DIAGNOSIS — E7849 Other hyperlipidemia: Secondary | ICD-10-CM | POA: Diagnosis not present

## 2023-11-01 DIAGNOSIS — I10 Essential (primary) hypertension: Secondary | ICD-10-CM | POA: Diagnosis not present

## 2023-11-01 DIAGNOSIS — M810 Age-related osteoporosis without current pathological fracture: Secondary | ICD-10-CM

## 2023-11-01 DIAGNOSIS — R739 Hyperglycemia, unspecified: Secondary | ICD-10-CM

## 2023-11-01 DIAGNOSIS — Z1231 Encounter for screening mammogram for malignant neoplasm of breast: Secondary | ICD-10-CM | POA: Diagnosis not present

## 2023-11-01 LAB — LIPID PANEL
Cholesterol: 217 mg/dL — ABNORMAL HIGH (ref 0–200)
HDL: 84.1 mg/dL (ref >39.00)
LDL Cholesterol: 122 mg/dL — ABNORMAL HIGH (ref 0–99)
NonHDL: 133.12
Total CHOL/HDL Ratio: 3
Triglycerides: 55 mg/dL (ref 0.0–149.0)
VLDL: 11 mg/dL (ref 0.0–40.0)

## 2023-11-01 LAB — CBC WITH DIFFERENTIAL/PLATELET
Basophils Absolute: 0 10*3/uL (ref 0.0–0.1)
Basophils Relative: 0.4 % (ref 0.0–3.0)
Eosinophils Absolute: 0.1 10*3/uL (ref 0.0–0.7)
Eosinophils Relative: 1.1 % (ref 0.0–5.0)
HCT: 37.3 % (ref 36.0–46.0)
Hemoglobin: 12.8 g/dL (ref 12.0–15.0)
Lymphocytes Relative: 19.6 % (ref 12.0–46.0)
Lymphs Abs: 1.2 10*3/uL (ref 0.7–4.0)
MCHC: 34.3 g/dL (ref 30.0–36.0)
MCV: 89.6 fL (ref 78.0–100.0)
Monocytes Absolute: 0.5 10*3/uL (ref 0.1–1.0)
Monocytes Relative: 7.7 % (ref 3.0–12.0)
Neutro Abs: 4.4 10*3/uL (ref 1.4–7.7)
Neutrophils Relative %: 71.2 % (ref 43.0–77.0)
Platelets: 268 10*3/uL (ref 150.0–400.0)
RBC: 4.16 Mil/uL (ref 3.87–5.11)
RDW: 13.3 % (ref 11.5–15.5)
WBC: 6.1 10*3/uL (ref 4.0–10.5)

## 2023-11-01 LAB — COMPREHENSIVE METABOLIC PANEL
ALT: 16 U/L (ref 0–35)
AST: 23 U/L (ref 0–37)
Albumin: 4.4 g/dL (ref 3.5–5.2)
Alkaline Phosphatase: 59 U/L (ref 39–117)
BUN: 12 mg/dL (ref 6–23)
CO2: 25 meq/L (ref 19–32)
Calcium: 9.6 mg/dL (ref 8.4–10.5)
Chloride: 97 meq/L (ref 96–112)
Creatinine, Ser: 0.78 mg/dL (ref 0.40–1.20)
GFR: 73.43 mL/min (ref >60.00)
Glucose, Bld: 79 mg/dL (ref 70–99)
Potassium: 4.3 meq/L (ref 3.5–5.1)
Sodium: 133 meq/L — ABNORMAL LOW (ref 135–145)
Total Bilirubin: 0.6 mg/dL (ref 0.2–1.2)
Total Protein: 7 g/dL (ref 6.0–8.3)

## 2023-11-01 LAB — HEMOGLOBIN A1C: Hgb A1c MFr Bld: 5.6 % (ref 4.6–6.5)

## 2023-11-01 LAB — HM MAMMOGRAPHY

## 2023-11-01 LAB — VITAMIN D 25 HYDROXY (VIT D DEFICIENCY, FRACTURES): VITD: 34.03 ng/mL (ref 30.00–100.00)

## 2023-11-01 LAB — TSH: TSH: 2.62 u[IU]/mL (ref 0.35–5.50)

## 2023-11-03 ENCOUNTER — Encounter: Payer: Self-pay | Admitting: Internal Medicine

## 2023-11-26 DIAGNOSIS — L0100 Impetigo, unspecified: Secondary | ICD-10-CM | POA: Diagnosis not present

## 2023-11-26 DIAGNOSIS — L2089 Other atopic dermatitis: Secondary | ICD-10-CM | POA: Diagnosis not present

## 2023-11-29 ENCOUNTER — Ambulatory Visit
Admission: RE | Admit: 2023-11-29 | Discharge: 2023-11-29 | Disposition: A | Discharge status: Home / Self Care | Payer: HMO | Source: Ambulatory Visit | Attending: Cardiology | Admitting: Cardiology

## 2023-11-29 DIAGNOSIS — I77810 Thoracic aortic ectasia: Secondary | ICD-10-CM

## 2023-11-29 MED ORDER — IOPAMIDOL (ISOVUE-370) INJECTION 76%
75.0000 mL | Freq: Once | INTRAVENOUS | Status: AC | PRN
  Administered 2023-11-29: 75 mL via INTRAVENOUS

## 2023-12-03 DIAGNOSIS — B029 Zoster without complications: Secondary | ICD-10-CM | POA: Diagnosis not present

## 2023-12-03 DIAGNOSIS — R21 Rash and other nonspecific skin eruption: Secondary | ICD-10-CM | POA: Diagnosis not present

## 2023-12-07 ENCOUNTER — Other Ambulatory Visit: Payer: Self-pay | Admitting: Internal Medicine

## 2023-12-09 ENCOUNTER — Encounter: Payer: Self-pay | Admitting: *Deleted

## 2023-12-09 ENCOUNTER — Encounter: Payer: Self-pay | Admitting: Internal Medicine

## 2023-12-10 ENCOUNTER — Other Ambulatory Visit: Payer: Self-pay | Admitting: Internal Medicine

## 2023-12-12 ENCOUNTER — Other Ambulatory Visit: Payer: Self-pay

## 2023-12-12 DIAGNOSIS — I872 Venous insufficiency (chronic) (peripheral): Secondary | ICD-10-CM

## 2023-12-12 MED ORDER — VASCULERA PO TABS: 1.0000 | ORAL_TABLET | Freq: Every day | ORAL | 3 refills | Status: AC

## 2024-03-13 DIAGNOSIS — L821 Other seborrheic keratosis: Secondary | ICD-10-CM | POA: Diagnosis not present

## 2024-03-13 DIAGNOSIS — Z85828 Personal history of other malignant neoplasm of skin: Secondary | ICD-10-CM | POA: Diagnosis not present

## 2024-03-13 DIAGNOSIS — D2339 Other benign neoplasm of skin of other parts of face: Secondary | ICD-10-CM | POA: Diagnosis not present

## 2024-05-21 ENCOUNTER — Ambulatory Visit: Payer: Self-pay

## 2024-05-21 NOTE — Telephone Encounter (Signed)
 FYI Only or Action Required?: FYI only for provider.  Patient was last seen in primary care on 10/22/2023 by Geofm Glade PARAS, MD.  Called Nurse Triage reporting Pain.  Symptoms began several days ago.  Interventions attempted: Nothing.  Symptoms are: unchanged.  Triage Disposition: See PCP When Office is Open (Within 3 Days)  Patient/caregiver understands and will follow disposition?: Yes   Copied from CRM #[id number]. Topic: Clinical - Red Word Triage >> May 21, 2024 12:10 PM Thersia BROCKS wrote: [facility] that prompted transfer to Nurse Triage: Patient called in stated she is having pain in her shoulder, and arm Reason for Disposition  [1] MODERATE pain (e.g., interferes with normal activities) AND [2] present > 3 days  Answer Assessment - Initial Assessment Questions 1. ONSET: When did the pain start?      Few months and worsening x 2 - 3 days 2. LOCATION: Where is the pain located?     Right arm and shoulder 3. PAIN: How bad is the pain? (Scale 0-10; or none, mild, moderate, severe)     4/10 4. WORK OR EXERCISE: Has there been any recent work or exercise that involved this part of the body?     no 5. CAUSE: What do you think is causing the arm pain?     unknown 6. OTHER SYMPTOMS: Do you have any other symptoms? (e.g., neck pain, swelling, rash, fever, numbness, weakness)     Swelling in shoulder and from right upper arm to elbow 7. PREGNANCY: Is there any chance you are pregnant? When was your last menstrual period?     na  Protocols used: Arm Pain-A-AH

## 2024-05-22 ENCOUNTER — Encounter: Payer: Self-pay | Admitting: Internal Medicine

## 2024-05-22 ENCOUNTER — Ambulatory Visit: Admitting: Internal Medicine

## 2024-05-22 VITALS — BP 130/68 | HR 64 | Temp 97.9°F | Ht 60.0 in | Wt 124.0 lb

## 2024-05-22 DIAGNOSIS — I10 Essential (primary) hypertension: Secondary | ICD-10-CM | POA: Diagnosis not present

## 2024-05-22 DIAGNOSIS — M25511 Pain in right shoulder: Secondary | ICD-10-CM

## 2024-05-22 NOTE — Progress Notes (Signed)
    Subjective:    Patient ID: Heather Ramsey, female    DOB: [birth year], 78 y.o.   MRN: [id number]      HPI Heather Ramsey is here for  Chief Complaint  Patient presents with   Shoulder Pain    Right shoulder pain x 1 week; Noticed bruised on her right arm after side swiping wall in the her house     1 week ago she hit her right shoulder/upper arm on the corner of a wall.  After that she was not able to lift her right arm.  She wore sling for 5 days and after that she was able to lift and move her arm.  She has pain in the shoulder and in the upper arm.  She has swelling and bruising in the upper arm.  She was concerned about a possible rotator cuff injury.  She has been taking Tylenol  and that has been controlling her pain.    Medications and allergies reviewed with patient and updated if appropriate.  Current Outpatient Medications on File Prior to Visit  Medication Sig Dispense Refill   amLODipine  (NORVASC ) 2.5 MG tablet Take 1 tablet by mouth once daily 90 tablet 0   benzonatate  (TESSALON ) 200 MG capsule Take 1 capsule (200 mg total) by mouth 3 (three) times daily as needed for cough. 30 capsule 1   Calcium Carbonate-Vitamin D  (CALCIUM + D PO) Take 1 tablet by mouth 2 (two) times daily.      Dietary Management Product (VASCULERA) TABS Take 1 tablet by mouth daily. 90 tablet 3   losartan  (COZAAR ) 50 MG tablet Take 1 tablet by mouth once daily 100 tablet 0   Psyllium (METAMUCIL) 28.3 % POWD Take 1 scoop dissolved in at least 8 ounces water/juice and drink daily     valACYclovir  (VALTREX ) 1000 MG tablet Take 2 tabs Q 12 hrs x 1 day for outbreak 12 tablet 5   No current facility-administered medications on file prior to visit.    Review of Systems     Objective:   Vitals:   05/22/24 1540  BP: 130/68  Pulse: 64  Temp: 97.9 F (36.6 C)  SpO2: 97%   BP Readings from Last 3 Encounters:  05/22/24 130/68  10/22/23 130/80  10/16/23 133/86   Wt Readings from Last 3  Encounters:  05/22/24 124 lb (56.2 kg)  10/22/23 132 lb (59.9 kg)  10/16/23 138 lb (62.6 kg)   Body mass index is 24.22 kg/m.    Physical Exam    A Right Shoulder exam was performed.   SWELLING: none in shoulder joint, swelling in right lateral arm with bruising EFFUSION: no  WARMTH: no warmth in shoulder joint, right upper arm warmth where she has the swelling/bruising TENDERNESS: tenderness in anterior shoulder and lateral right upper arm.   ROM: Slightly decreased ROM with pain at extreme movements - especially lateral flexion NEUROLOGICAL EXAM: normal sensation and strength in arm and hand PULSES: normal SKIN: Normal      Assessment & Plan:    See Problem List for Assessment and Plan of chronic medical problems.

## 2024-05-22 NOTE — Assessment & Plan Note (Signed)
 Chronic Blood pressure well controlled Continue amlodipine  2.5 mg daily, losartan  50 mg daily

## 2024-05-22 NOTE — Patient Instructions (Addendum)
     Your likely have a rotator cuff injury.   Take tylenol .  You can use ice, heat and topical pain medication as needed.      Return if symptoms worsen or fail to improve.

## 2024-05-22 NOTE — Assessment & Plan Note (Signed)
 Acute Suffered an injury to the right upper arm/shoulder 1 week ago and initially was not able to move the arm, but that has improved and she does have full range of motion with some pain at the extremes of motion-especially lateral flexion Right upper arm is swollen and bruised from where she hit the wall Right anterior shoulder is tender to palpation She likely has rotator cuff injury in addition to her right upper arm injury Given her good range of motion and pain controlled with Tylenol  she would just like to monitor Continue Tylenol .  Can try ice, heat and topical pain relievers If pain is not improving or range of motion is not improving she will let me know-can refer to sports medicine

## 2024-05-31 ENCOUNTER — Other Ambulatory Visit: Payer: Self-pay | Admitting: Internal Medicine

## 2024-06-25 DIAGNOSIS — H04123 Dry eye syndrome of bilateral lacrimal glands: Secondary | ICD-10-CM | POA: Diagnosis not present

## 2024-06-25 DIAGNOSIS — H353121 Nonexudative age-related macular degeneration, left eye, early dry stage: Secondary | ICD-10-CM | POA: Diagnosis not present

## 2024-06-25 DIAGNOSIS — H52203 Unspecified astigmatism, bilateral: Secondary | ICD-10-CM | POA: Diagnosis not present

## 2024-06-25 DIAGNOSIS — H5213 Myopia, bilateral: Secondary | ICD-10-CM | POA: Diagnosis not present

## 2024-06-25 DIAGNOSIS — H43813 Vitreous degeneration, bilateral: Secondary | ICD-10-CM | POA: Diagnosis not present

## 2024-06-25 DIAGNOSIS — Z961 Presence of intraocular lens: Secondary | ICD-10-CM | POA: Diagnosis not present

## 2024-06-25 DIAGNOSIS — H524 Presbyopia: Secondary | ICD-10-CM | POA: Diagnosis not present

## 2024-07-07 ENCOUNTER — Encounter: Payer: Self-pay | Admitting: Internal Medicine

## 2024-07-15 ENCOUNTER — Other Ambulatory Visit: Payer: Self-pay | Admitting: Internal Medicine

## 2024-07-21 ENCOUNTER — Ambulatory Visit (INDEPENDENT_AMBULATORY_CARE_PROVIDER_SITE_OTHER): Payer: HMO

## 2024-07-21 VITALS — Ht 60.0 in | Wt 128.5 lb

## 2024-07-21 DIAGNOSIS — Z0000 Encounter for general adult medical examination without abnormal findings: Secondary | ICD-10-CM | POA: Diagnosis not present
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
## 2024-07-21 NOTE — Patient Instructions (Addendum)
 Ms. Skoda,  Thank you for taking the time for your Medicare Wellness Visit. I appreciate your continued commitment to your health goals. Please review the care plan we discussed, and feel free to reach out if I can assist you further.  Medicare recommends these wellness visits once per year to help you and your care team stay ahead of potential health issues. These visits are designed to focus on prevention, allowing your provider to concentrate on managing your acute and chronic conditions during your regular appointments.  Please note that Annual Wellness Visits do not include a physical exam. Some assessments may be limited, especially if the visit was conducted virtually. If needed, we may recommend a separate in-person follow-up with your provider.  Ongoing Care Seeing your primary care provider every 3 to 6 months helps us  monitor your health and provide consistent, personalized care.   Referrals If a referral was made during today's visit and you haven't received any updates within two weeks, please contact the referred provider directly to check on the status.  Recommended Screenings:  Health Maintenance  Topic Date Due   DTaP/Tdap/Td vaccine (2 - Tdap) 10/21/2024*   Colon Cancer Screening  02/17/2025   Medicare Annual Wellness Visit  07/21/2025   Pneumococcal Vaccine for age over 81  Completed   Flu Shot  Completed   Hepatitis C Screening  Completed   Zoster (Shingles) Vaccine  Completed   Meningitis B Vaccine  Aged Out   Breast Cancer Screening  Discontinued   DEXA scan (bone density measurement)  Discontinued   COVID-19 Vaccine  Discontinued  *Topic was postponed. The date shown is not the original due date.       07/21/2024    8:13 AM  Advanced Directives  Does Patient Have a Medical Advance Directive? Yes  Type of Estate Agent of [place];Living will  Does patient want to make changes to medical advance directive? No - Patient declined   Copy of Healthcare Power of Attorney in Chart? Yes - validated most recent copy scanned in chart (See row information)   Advance Care Planning is important because it: Ensures you receive medical care that aligns with your values, goals, and preferences. Provides guidance to your family and loved ones, reducing the emotional burden of decision-making during critical moments.  Vision: Annual vision screenings are recommended for early detection of glaucoma, cataracts, and diabetic retinopathy. These exams can also reveal signs of chronic conditions such as diabetes and high blood pressure.  Dental: Annual dental screenings help detect early signs of oral cancer, gum disease, and other conditions linked to overall health, including heart disease and diabetes.

## 2024-07-22 DIAGNOSIS — L820 Inflamed seborrheic keratosis: Secondary | ICD-10-CM | POA: Diagnosis not present

## 2024-07-22 DIAGNOSIS — L821 Other seborrheic keratosis: Secondary | ICD-10-CM | POA: Diagnosis not present

## 2024-07-22 DIAGNOSIS — L2089 Other atopic dermatitis: Secondary | ICD-10-CM | POA: Diagnosis not present

## 2024-07-22 DIAGNOSIS — D485 Neoplasm of uncertain behavior of skin: Secondary | ICD-10-CM | POA: Diagnosis not present

## 2024-07-22 DIAGNOSIS — B079 Viral wart, unspecified: Secondary | ICD-10-CM | POA: Diagnosis not present

## 2024-07-22 DIAGNOSIS — B078 Other viral warts: Secondary | ICD-10-CM | POA: Diagnosis not present

## 2024-07-22 DIAGNOSIS — Z85828 Personal history of other malignant neoplasm of skin: Secondary | ICD-10-CM | POA: Diagnosis not present

## 2024-09-07 ENCOUNTER — Other Ambulatory Visit: Payer: Self-pay | Admitting: Internal Medicine

## 2024-10-01 ENCOUNTER — Telehealth: Payer: Self-pay

## 2024-10-01 NOTE — Telephone Encounter (Signed)
 Copied from CRM [id number]. Topic: General - Other >> Oct 01, 2024  2:35 PM Delon DASEN wrote: Reason for CRM: returning message about bone density scan, does not want one at this time, was advised against it by dentist another specialist

## 2024-10-01 NOTE — Telephone Encounter (Signed)
 Noted

## 2024-10-05 ENCOUNTER — Encounter: Payer: Self-pay | Admitting: *Deleted

## 2024-10-12 ENCOUNTER — Other Ambulatory Visit: Payer: Self-pay | Admitting: Internal Medicine

## 2024-11-13 ENCOUNTER — Encounter: Admitting: Internal Medicine
